# Patient Record
Sex: Female | Born: 1985 | Hispanic: Yes | Marital: Married | Smoking: Never smoker
Health system: Southern US, Community
[De-identification: ages and names within clinical notes are randomized; demographics above are authoritative.]

## PROBLEM LIST (undated history)

## (undated) ENCOUNTER — Inpatient Hospital Stay (HOSPITAL_COMMUNITY): Payer: Self-pay

## (undated) ENCOUNTER — Emergency Department (HOSPITAL_COMMUNITY): Payer: Self-pay | Source: Home / Self Care

## (undated) DIAGNOSIS — K831 Obstruction of bile duct: Secondary | ICD-10-CM

## (undated) DIAGNOSIS — F329 Major depressive disorder, single episode, unspecified: Secondary | ICD-10-CM

## (undated) DIAGNOSIS — O26649 Intrahepatic cholestasis of pregnancy, unspecified trimester: Secondary | ICD-10-CM

## (undated) DIAGNOSIS — Z9889 Other specified postprocedural states: Secondary | ICD-10-CM

## (undated) DIAGNOSIS — R1013 Epigastric pain: Secondary | ICD-10-CM

## (undated) DIAGNOSIS — R87629 Unspecified abnormal cytological findings in specimens from vagina: Secondary | ICD-10-CM

## (undated) DIAGNOSIS — G43909 Migraine, unspecified, not intractable, without status migrainosus: Secondary | ICD-10-CM

## (undated) DIAGNOSIS — K219 Gastro-esophageal reflux disease without esophagitis: Secondary | ICD-10-CM

## (undated) DIAGNOSIS — T8859XA Other complications of anesthesia, initial encounter: Secondary | ICD-10-CM

## (undated) DIAGNOSIS — J329 Chronic sinusitis, unspecified: Secondary | ICD-10-CM

## (undated) DIAGNOSIS — T4145XA Adverse effect of unspecified anesthetic, initial encounter: Secondary | ICD-10-CM

## (undated) DIAGNOSIS — R531 Weakness: Secondary | ICD-10-CM

## (undated) DIAGNOSIS — M549 Dorsalgia, unspecified: Secondary | ICD-10-CM

## (undated) DIAGNOSIS — O24419 Gestational diabetes mellitus in pregnancy, unspecified control: Secondary | ICD-10-CM

## (undated) DIAGNOSIS — F419 Anxiety disorder, unspecified: Secondary | ICD-10-CM

## (undated) DIAGNOSIS — A749 Chlamydial infection, unspecified: Secondary | ICD-10-CM

## (undated) DIAGNOSIS — B999 Unspecified infectious disease: Secondary | ICD-10-CM

## (undated) DIAGNOSIS — R112 Nausea with vomiting, unspecified: Secondary | ICD-10-CM

## (undated) DIAGNOSIS — M1990 Unspecified osteoarthritis, unspecified site: Secondary | ICD-10-CM

## (undated) DIAGNOSIS — F32A Depression, unspecified: Secondary | ICD-10-CM

## (undated) DIAGNOSIS — N200 Calculus of kidney: Secondary | ICD-10-CM

## (undated) HISTORY — DX: Unspecified osteoarthritis, unspecified site: M19.90

## (undated) HISTORY — PX: COLPOSCOPY: SHX161

## (undated) HISTORY — DX: Chronic sinusitis, unspecified: J32.9

## (undated) HISTORY — DX: Unspecified abnormal cytological findings in specimens from vagina: R87.629

## (undated) HISTORY — DX: Dorsalgia, unspecified: M54.9

## (undated) HISTORY — DX: Epigastric pain: R10.13

## (undated) HISTORY — DX: Gestational diabetes mellitus in pregnancy, unspecified control: O24.419

## (undated) HISTORY — DX: Migraine, unspecified, not intractable, without status migrainosus: G43.909

## (undated) HISTORY — DX: Major depressive disorder, single episode, unspecified: F32.9

## (undated) HISTORY — DX: Gastro-esophageal reflux disease without esophagitis: K21.9

## (undated) HISTORY — DX: Obstruction of bile duct: K83.1

## (undated) HISTORY — PX: DILATION AND CURETTAGE OF UTERUS: SHX78

## (undated) HISTORY — DX: Weakness: R53.1

## (undated) HISTORY — DX: Intrahepatic cholestasis of pregnancy, unspecified trimester: O26.649

## (undated) HISTORY — DX: Calculus of kidney: N20.0

## (undated) HISTORY — DX: Depression, unspecified: F32.A

---

## 2005-09-27 ENCOUNTER — Emergency Department (HOSPITAL_COMMUNITY): Admission: EM | Admit: 2005-09-27 | Discharge: 2005-09-28 | Payer: Self-pay | Admitting: Emergency Medicine

## 2005-12-12 DIAGNOSIS — A749 Chlamydial infection, unspecified: Secondary | ICD-10-CM

## 2005-12-12 HISTORY — DX: Chlamydial infection, unspecified: A74.9

## 2006-03-03 ENCOUNTER — Inpatient Hospital Stay (HOSPITAL_COMMUNITY): Admission: AD | Admit: 2006-03-03 | Discharge: 2006-03-04 | Payer: Self-pay | Admitting: Obstetrics & Gynecology

## 2006-03-09 ENCOUNTER — Inpatient Hospital Stay (HOSPITAL_COMMUNITY): Admission: AD | Admit: 2006-03-09 | Discharge: 2006-03-09 | Payer: Self-pay | Admitting: *Deleted

## 2006-03-16 ENCOUNTER — Inpatient Hospital Stay (HOSPITAL_COMMUNITY): Admission: AD | Admit: 2006-03-16 | Discharge: 2006-03-16 | Payer: Self-pay | Admitting: Gynecology

## 2006-03-20 ENCOUNTER — Inpatient Hospital Stay (HOSPITAL_COMMUNITY): Admission: AD | Admit: 2006-03-20 | Discharge: 2006-03-20 | Payer: Self-pay | Admitting: *Deleted

## 2006-03-22 ENCOUNTER — Encounter (INDEPENDENT_AMBULATORY_CARE_PROVIDER_SITE_OTHER): Payer: Self-pay | Admitting: Specialist

## 2006-03-22 ENCOUNTER — Ambulatory Visit: Payer: Self-pay | Admitting: Gynecology

## 2006-03-22 ENCOUNTER — Ambulatory Visit (HOSPITAL_COMMUNITY): Admission: RE | Admit: 2006-03-22 | Discharge: 2006-03-22 | Payer: Self-pay | Admitting: Gynecology

## 2006-08-23 ENCOUNTER — Inpatient Hospital Stay (HOSPITAL_COMMUNITY): Admission: AD | Admit: 2006-08-23 | Discharge: 2006-08-24 | Payer: Self-pay | Admitting: Gynecology

## 2006-09-05 ENCOUNTER — Inpatient Hospital Stay (HOSPITAL_COMMUNITY): Admission: RE | Admit: 2006-09-05 | Discharge: 2006-09-05 | Payer: Self-pay | Admitting: Gynecology

## 2006-09-25 ENCOUNTER — Ambulatory Visit (HOSPITAL_COMMUNITY): Admission: RE | Admit: 2006-09-25 | Discharge: 2006-09-25 | Payer: Self-pay | Admitting: Family Medicine

## 2006-10-16 ENCOUNTER — Inpatient Hospital Stay (HOSPITAL_COMMUNITY): Admission: AD | Admit: 2006-10-16 | Discharge: 2006-10-16 | Payer: Self-pay | Admitting: Gynecology

## 2006-11-23 ENCOUNTER — Ambulatory Visit (HOSPITAL_COMMUNITY): Admission: RE | Admit: 2006-11-23 | Discharge: 2006-11-23 | Payer: Self-pay | Admitting: Obstetrics & Gynecology

## 2006-12-10 ENCOUNTER — Ambulatory Visit: Payer: Self-pay | Admitting: Obstetrics and Gynecology

## 2006-12-10 ENCOUNTER — Inpatient Hospital Stay (HOSPITAL_COMMUNITY): Admission: AD | Admit: 2006-12-10 | Discharge: 2006-12-10 | Payer: Self-pay | Admitting: Obstetrics and Gynecology

## 2006-12-11 ENCOUNTER — Inpatient Hospital Stay (HOSPITAL_COMMUNITY): Admission: AD | Admit: 2006-12-11 | Discharge: 2006-12-15 | Payer: Self-pay | Admitting: Gynecology

## 2006-12-11 ENCOUNTER — Ambulatory Visit: Payer: Self-pay | Admitting: Obstetrics and Gynecology

## 2006-12-13 ENCOUNTER — Encounter: Payer: Self-pay | Admitting: Gynecology

## 2007-04-19 ENCOUNTER — Ambulatory Visit (HOSPITAL_COMMUNITY): Admission: RE | Admit: 2007-04-19 | Discharge: 2007-04-19 | Payer: Self-pay | Admitting: Obstetrics & Gynecology

## 2007-04-26 ENCOUNTER — Inpatient Hospital Stay (HOSPITAL_COMMUNITY): Admission: AD | Admit: 2007-04-26 | Discharge: 2007-04-26 | Payer: Self-pay | Admitting: Obstetrics & Gynecology

## 2007-05-03 ENCOUNTER — Inpatient Hospital Stay (HOSPITAL_COMMUNITY): Admission: AD | Admit: 2007-05-03 | Discharge: 2007-05-06 | Payer: Self-pay | Admitting: Obstetrics & Gynecology

## 2008-12-21 ENCOUNTER — Inpatient Hospital Stay (HOSPITAL_COMMUNITY): Admission: AD | Admit: 2008-12-21 | Discharge: 2008-12-21 | Payer: Self-pay | Admitting: Obstetrics and Gynecology

## 2009-03-11 ENCOUNTER — Emergency Department (HOSPITAL_COMMUNITY): Admission: EM | Admit: 2009-03-11 | Discharge: 2009-03-11 | Payer: Self-pay | Admitting: Family Medicine

## 2009-05-20 ENCOUNTER — Inpatient Hospital Stay (HOSPITAL_COMMUNITY): Admission: AD | Admit: 2009-05-20 | Discharge: 2009-05-20 | Payer: Self-pay | Admitting: Obstetrics & Gynecology

## 2009-05-20 ENCOUNTER — Ambulatory Visit: Payer: Self-pay | Admitting: Advanced Practice Midwife

## 2009-09-07 ENCOUNTER — Emergency Department (HOSPITAL_COMMUNITY): Admission: EM | Admit: 2009-09-07 | Discharge: 2009-09-07 | Payer: Self-pay | Admitting: Family Medicine

## 2009-09-09 ENCOUNTER — Emergency Department (HOSPITAL_COMMUNITY): Admission: EM | Admit: 2009-09-09 | Discharge: 2009-09-09 | Payer: Self-pay | Admitting: Emergency Medicine

## 2009-10-18 ENCOUNTER — Emergency Department (HOSPITAL_COMMUNITY): Admission: EM | Admit: 2009-10-18 | Discharge: 2009-10-18 | Payer: Self-pay | Admitting: Family Medicine

## 2009-12-06 ENCOUNTER — Emergency Department (HOSPITAL_COMMUNITY): Admission: EM | Admit: 2009-12-06 | Discharge: 2009-12-06 | Payer: Self-pay | Admitting: Emergency Medicine

## 2009-12-08 ENCOUNTER — Inpatient Hospital Stay (HOSPITAL_COMMUNITY): Admission: AD | Admit: 2009-12-08 | Discharge: 2009-12-08 | Payer: Self-pay | Admitting: Obstetrics and Gynecology

## 2010-11-26 ENCOUNTER — Emergency Department (HOSPITAL_COMMUNITY)
Admission: EM | Admit: 2010-11-26 | Discharge: 2010-11-26 | Payer: Self-pay | Source: Home / Self Care | Admitting: Emergency Medicine

## 2010-11-30 ENCOUNTER — Emergency Department (HOSPITAL_COMMUNITY)
Admission: EM | Admit: 2010-11-30 | Discharge: 2010-11-30 | Payer: Self-pay | Source: Home / Self Care | Admitting: Emergency Medicine

## 2010-12-12 HISTORY — PX: COLONOSCOPY: SHX174

## 2010-12-12 HISTORY — PX: UPPER GASTROINTESTINAL ENDOSCOPY: SHX188

## 2011-01-02 ENCOUNTER — Encounter: Payer: Self-pay | Admitting: Obstetrics & Gynecology

## 2011-01-06 ENCOUNTER — Other Ambulatory Visit: Payer: Self-pay | Admitting: Gastroenterology

## 2011-01-07 ENCOUNTER — Encounter
Admission: RE | Admit: 2011-01-07 | Discharge: 2011-01-07 | Payer: Self-pay | Source: Home / Self Care | Attending: Gastroenterology | Admitting: Gastroenterology

## 2011-01-27 ENCOUNTER — Other Ambulatory Visit: Payer: Self-pay | Admitting: Gastroenterology

## 2011-02-21 LAB — COMPREHENSIVE METABOLIC PANEL
ALT: 26 U/L (ref 0–35)
AST: 28 U/L (ref 0–37)
Albumin: 3.8 g/dL (ref 3.5–5.2)
Calcium: 9.1 mg/dL (ref 8.4–10.5)
GFR calc Af Amer: >60 mL/min (ref >60)
Sodium: 137 mEq/L (ref 135–145)
Total Protein: 6.8 g/dL (ref 6.0–8.3)

## 2011-02-21 LAB — URINALYSIS, ROUTINE W REFLEX MICROSCOPIC
Bilirubin Urine: NEGATIVE
Glucose, UA: NEGATIVE mg/dL
Ketones, ur: NEGATIVE mg/dL
Nitrite: NEGATIVE
Protein, ur: NEGATIVE mg/dL
Urobilinogen, UA: 0.2 mg/dL (ref 0.0–1.0)
Urobilinogen, UA: 0.2 mg/dL (ref 0.0–1.0)

## 2011-02-21 LAB — POCT PREGNANCY, URINE: Preg Test, Ur: NEGATIVE

## 2011-02-21 LAB — URINE MICROSCOPIC-ADD ON

## 2011-02-21 LAB — DIFFERENTIAL
Eosinophils Absolute: 0.1 10*3/uL (ref 0.0–0.7)
Eosinophils Relative: 1 % (ref 0–5)
Lymphs Abs: 2.9 10*3/uL (ref 0.7–4.0)
Monocytes Relative: 8 % (ref 3–12)

## 2011-02-21 LAB — CBC
MCHC: 32.9 g/dL (ref 30.0–36.0)
RDW: 13.5 % (ref 11.5–15.5)

## 2011-03-14 LAB — URINALYSIS, ROUTINE W REFLEX MICROSCOPIC
Glucose, UA: NEGATIVE mg/dL
Ketones, ur: NEGATIVE mg/dL
Protein, ur: NEGATIVE mg/dL

## 2011-03-14 LAB — POCT PREGNANCY, URINE: Preg Test, Ur: NEGATIVE

## 2011-03-14 LAB — GC/CHLAMYDIA PROBE AMP, GENITAL: Chlamydia, DNA Probe: NEGATIVE

## 2011-03-14 LAB — WET PREP, GENITAL
Clue Cells Wet Prep HPF POC: NONE SEEN
WBC, Wet Prep HPF POC: NONE SEEN

## 2011-03-18 LAB — DIFFERENTIAL
Basophils Absolute: 0.1 10*3/uL (ref 0.0–0.1)
Basophils Relative: 1 % (ref 0–1)
Basophils Relative: 1 % (ref 0–1)
Eosinophils Absolute: 0.1 10*3/uL (ref 0.0–0.7)
Lymphocytes Relative: 33 % (ref 12–46)
Lymphs Abs: 2.3 10*3/uL (ref 0.7–4.0)
Lymphs Abs: 2.9 10*3/uL (ref 0.7–4.0)
Monocytes Absolute: 0.6 10*3/uL (ref 0.1–1.0)
Monocytes Relative: 7 % (ref 3–12)
Monocytes Relative: 8 % (ref 3–12)
Neutro Abs: 4.2 10*3/uL (ref 1.7–7.7)

## 2011-03-18 LAB — URINALYSIS, ROUTINE W REFLEX MICROSCOPIC
Glucose, UA: NEGATIVE mg/dL
Hgb urine dipstick: NEGATIVE
Ketones, ur: NEGATIVE mg/dL
Protein, ur: NEGATIVE mg/dL
pH: 7 (ref 5.0–8.0)

## 2011-03-18 LAB — CBC
HCT: 35.8 % — ABNORMAL LOW (ref 36.0–46.0)
Hemoglobin: 12.2 g/dL (ref 12.0–15.0)
Platelets: 267 10*3/uL (ref 150–400)
RBC: 4.41 MIL/uL (ref 3.87–5.11)
RDW: 14 % (ref 11.5–15.5)
RDW: 14.4 % (ref 11.5–15.5)

## 2011-03-18 LAB — POCT URINALYSIS DIP (DEVICE)
Hgb urine dipstick: NEGATIVE
Ketones, ur: NEGATIVE mg/dL
Protein, ur: NEGATIVE mg/dL
Specific Gravity, Urine: 1.015 (ref 1.005–1.030)
pH: >=9 (ref 5.0–8.0)

## 2011-03-18 LAB — BASIC METABOLIC PANEL
Calcium: 9.1 mg/dL (ref 8.4–10.5)
GFR calc Af Amer: >60 mL/min (ref >60)
GFR calc non Af Amer: >60 mL/min (ref >60)
Glucose, Bld: 91 mg/dL (ref 70–99)
Potassium: 3.9 mEq/L (ref 3.5–5.1)
Sodium: 138 mEq/L (ref 135–145)

## 2011-03-18 LAB — WET PREP, GENITAL
Trich, Wet Prep: NONE SEEN
Yeast Wet Prep HPF POC: NONE SEEN

## 2011-03-18 LAB — COMPREHENSIVE METABOLIC PANEL
ALT: 28 U/L (ref 0–35)
Albumin: 4.3 g/dL (ref 3.5–5.2)
Alkaline Phosphatase: 92 U/L (ref 39–117)
Calcium: 9.5 mg/dL (ref 8.4–10.5)
GFR calc Af Amer: >60 mL/min (ref >60)
Potassium: 4.2 mEq/L (ref 3.5–5.1)
Sodium: 136 mEq/L (ref 135–145)
Total Protein: 7.5 g/dL (ref 6.0–8.3)

## 2011-03-18 LAB — GC/CHLAMYDIA PROBE AMP, GENITAL: Chlamydia, DNA Probe: NEGATIVE

## 2011-03-18 LAB — POCT PREGNANCY, URINE: Preg Test, Ur: NEGATIVE

## 2011-03-18 LAB — LIPASE, BLOOD: Lipase: 29 U/L (ref 11–59)

## 2011-03-21 LAB — WET PREP, GENITAL: Yeast Wet Prep HPF POC: NONE SEEN

## 2011-03-21 LAB — URINALYSIS, ROUTINE W REFLEX MICROSCOPIC
Nitrite: NEGATIVE
Protein, ur: NEGATIVE mg/dL
Specific Gravity, Urine: 1.025 (ref 1.005–1.030)
Urobilinogen, UA: 0.2 mg/dL (ref 0.0–1.0)

## 2011-03-21 LAB — POCT PREGNANCY, URINE: Preg Test, Ur: NEGATIVE

## 2011-03-24 LAB — POCT RAPID STREP A (OFFICE): Streptococcus, Group A Screen (Direct): NEGATIVE

## 2011-03-28 LAB — POCT PREGNANCY, URINE: Preg Test, Ur: NEGATIVE

## 2011-03-28 LAB — URINALYSIS, ROUTINE W REFLEX MICROSCOPIC
Glucose, UA: NEGATIVE mg/dL
Ketones, ur: NEGATIVE mg/dL
Nitrite: NEGATIVE
pH: 6 (ref 5.0–8.0)

## 2011-04-26 NOTE — H&P (Signed)
NAMEANALEISE, MCCLEERY     ACCOUNT NO.:  [id number]   MEDICAL RECORD NO.:  [id number]          PATIENT TYPE:  INP   LOCATION:  9173                          FACILITY:  [facility]   PHYSICIAN:  Roseanna Rainbow, M.D.DATE OF BIRTH:  Apr 16, 1986   DATE OF ADMISSION:  05/03/2007  DATE OF DISCHARGE:                              HISTORY & PHYSICAL   CHIEF COMPLAINT:  The patient is a 25-year-old para 1 with an estimated  date of confinement of Apr 23, 2007, with an intrauterine pregnancy at  41 plus weeks for induction of labor secondary to post dates.   ALLERGIES:  NO KNOWN DRUG ALLERGIES.   MEDICATIONS:  Please see the reconciliation form.   OBSTETRICAL RISK FACTORS:  Urinary tract infection.   PRENATAL SCREENING:  Hematocrit 32.7, hemoglobin 10.1, platelets  263,000.  Urine culture and sensitivity:  No growth.  One-hour GCT 112.  Blood type A positive.  Antibody screen negative.  Hepatitis B surface  antigen negative.  Rubella immune.  Sickle cell negative.  RPR  nonreactive.  Varicella immune.   PAST GYNECOLOGIC HISTORY:  1. D&C for an incomplete abortion.  2. Chlamydia.   PAST MEDICAL HISTORY:  1. Anemia.  2. Urinary tract infection.   PAST SURGICAL HISTORY:  Please see the above.   SOCIAL HISTORY:  She is employed as a Tourist information centre manager.  She is  married living with her spouse, does not give any history of significant  alcohol usage, has no significant smoking history.   FAMILY HISTORY:  Hypertension.   PAST OBSTETRICAL HISTORY:  1. In 2007, she had a spontaneous abortion.  2. In August 2003, she was delivered of a liveborn female, 7 pounds 6      ounces, full term, vaginal delivery, no complications.   PHYSICAL EXAMINATION:  VITAL SIGNS:  Stable, afebrile.  Fetal heart  tracing reassuring.  Tocodynamometer rare uterine contractions.  GENERAL:  Well developed, well nourished, no apparent distress.  ABDOMEN:  Gravid.  PELVIC:  Sterile vaginal exam per the RN.   ASSESSMENT:  1. Intrauterine pregnancy at 41 plus weeks.  2. Borderline Bishop score.  3. Fetal heart tracing consistent with fetal well being.   PLAN:  Admission two stage induction of labor.  Will begin with cervical  ripening with Cervidil.      Roseanna Rainbow, M.D.  Electronically Signed     LAJ/MEDQ  D:  05/03/2007  T:  05/03/2007  Job:  [id number]

## 2011-04-29 NOTE — Op Note (Signed)
NAMESHATHA, HOOSER     ACCOUNT NO.:  [id number]   MEDICAL RECORD NO.:  [id number]          PATIENT TYPE:  AMB   LOCATION:  SDC                           FACILITY:  [facility]   PHYSICIAN:  Ginger Carne, MD  DATE OF BIRTH:  05-01-1986   DATE OF PROCEDURE:  03/22/2006  DATE OF DISCHARGE:                                 OPERATIVE REPORT   PREOPERATIVE DIAGNOSIS:  First trimester incomplete abortion.   POSTOPERATIVE DIAGNOSIS:  First trimester incomplete abortion.   PROCEDURE:  Aspiration dilatation and curettage.   SURGEON:  Ginger Carne, M.D.   ASSISTANT:  None.   COMPLICATIONS:  None immediate.   ESTIMATED BLOOD LOSS:  Minimal.   SPECIMEN:  Products of conception.   OPERATIVE FINDINGS:  Cervix was open with proximal conception visible.  The  uterus was about four to six weeks in size.  Both adnexa palpable and found  to be normal.   OPERATIVE PROCEDURE:  The patient prepped and draped in the usual fashion  and placed in the lithotomy position, Betadine solution used for antiseptic  and the patient was catheterized prior to procedure.  Afterwards a tenaculum  was placed on the anterior lip of the cervix.  A paracervical block with  Marcaine and epinephrine utilized.  Dilatation was not necessary, and a #8  suction cannula was utilized for curettage.  No products of conception noted  at the end of the procedure, no bleeding noted.  Specimen to pathology.  The  patient tolerated the procedure well and returned to the postanesthesia  recovery room in excellent condition.      Ginger Carne, MD  Electronically Signed     SHB/MEDQ  D:  03/22/2006  T:  03/22/2006  Job:  [id number]

## 2011-04-29 NOTE — Discharge Summary (Signed)
NAMEQUANESHA, KLIMASZEWSKI     ACCOUNT NO.:  [id number]   MEDICAL RECORD NO.:  [id number]          PATIENT TYPE:  INP   LOCATION:  9317                          FACILITY:  [facility]   PHYSICIAN:  Ginger Carne, MD  DATE OF BIRTH:  1986/01/23   DATE OF ADMISSION:  12/11/2006  DATE OF DISCHARGE:  12/15/2006                               DISCHARGE SUMMARY   ADMISSION DIAGNOSES:  1. Pyelonephritis.  2. Intrauterine pregnancy at 20 weeks 3 days gestation.   DISCHARGE DIAGNOSES:  1. Pyelonephritis.  2. Intrauterine pregnancy at 20 weeks 3 days gestation.   PERTINENT LABORATORIES:  White blood cell count of 11.4, hemoglobin 9.9,  platelets 256.  Neutrophil count of 79, creatinine of 0.4, urine culture  that was positive for greater than 100,000 E. coli that was  pansensitive.   STUDIES:  1. Renal ultrasound which showed a mild fullness of bilateral      intrarenal collecting system maybe physiologic in pregnancy,      bilateral ureteral jets were seen.  2. MRI of the abdomen and pelvis that shows no nephroureterolithiasis.      There was trace left-sided perinephric fluid that is nonspecific      but could be consistent with given diagnosis of pyelo.  No      hydronephrosis seen.   REASON FOR ADMISSION:  This is a 25 year old gravida 3 para 1-0-1-1 at  20 weeks 3 days gestation who presented with worsening left-sided  abdominal pain, positive nausea/vomiting, no dysuria or hematuria.  She  was seen 1 day prior to admission with similar complaints and had a  urine culture done that resulted in greater than 100,000 E. coli.  On  presentation she was found to have a left-sided CVA tenderness and was  admitted for pyelonephritis.   HOSPITAL COURSE:  The patient admitted with pyelo.  She was started on  Ancef 1 gram q.6.  The patient spiked a temperature of 101.9 on hospital  day #2.  Gentamicin was added to her regimen and the patient remained  afebrile throughout the rest of the  hospital course.  She  symptomatically improved and was hemodynamically stable and felt stable  enough for discharge on hospital day #4.  Her discharge medications  included Keflex 500 mg p.o. b.i.d. x10 days, Macrobid 100 mg daily for  suppression, prenatal vitamins.  Her discharge instructions included  followup at the  health department on December 19, 2006 as scheduled.  She was sent home  with a strainer to strain her urine because her symptoms are also  consistent with having a kidney stone.  The patient also given a note to  return to work on December 19, 2006.     ______________________________  Paticia Stack, MD      Ginger Carne, MD  Electronically Signed    LNJ/MEDQ  D:  12/20/2006  T:  12/20/2006  Job:  [id number]

## 2012-02-20 ENCOUNTER — Encounter (HOSPITAL_COMMUNITY): Payer: Self-pay | Admitting: *Deleted

## 2012-02-20 ENCOUNTER — Inpatient Hospital Stay (HOSPITAL_COMMUNITY): Payer: Medicaid Other

## 2012-02-20 ENCOUNTER — Inpatient Hospital Stay (HOSPITAL_COMMUNITY)
Admission: AD | Admit: 2012-02-20 | Discharge: 2012-02-20 | Disposition: A | Discharge status: Home / Self Care | Payer: Medicaid Other | Source: Ambulatory Visit | Attending: Obstetrics & Gynecology | Admitting: Obstetrics & Gynecology

## 2012-02-20 DIAGNOSIS — R109 Unspecified abdominal pain: Secondary | ICD-10-CM | POA: Insufficient documentation

## 2012-02-20 DIAGNOSIS — N898 Other specified noninflammatory disorders of vagina: Secondary | ICD-10-CM | POA: Insufficient documentation

## 2012-02-20 DIAGNOSIS — N949 Unspecified condition associated with female genital organs and menstrual cycle: Secondary | ICD-10-CM | POA: Insufficient documentation

## 2012-02-20 DIAGNOSIS — R102 Pelvic and perineal pain: Secondary | ICD-10-CM

## 2012-02-20 HISTORY — DX: Chlamydial infection, unspecified: A74.9

## 2012-02-20 HISTORY — DX: Unspecified infectious disease: B99.9

## 2012-02-20 LAB — CBC
HCT: 37.2 % (ref 36.0–46.0)
Hemoglobin: 12.2 g/dL (ref 12.0–15.0)
RBC: 4.69 MIL/uL (ref 3.87–5.11)

## 2012-02-20 LAB — URINE MICROSCOPIC-ADD ON

## 2012-02-20 LAB — URINALYSIS, ROUTINE W REFLEX MICROSCOPIC
Bilirubin Urine: NEGATIVE
Nitrite: NEGATIVE
Protein, ur: NEGATIVE mg/dL
Urobilinogen, UA: 0.2 mg/dL (ref 0.0–1.0)

## 2012-02-20 LAB — WET PREP, GENITAL: Clue Cells Wet Prep HPF POC: NONE SEEN

## 2012-02-20 LAB — DIFFERENTIAL
Lymphs Abs: 2.8 10*3/uL (ref 0.7–4.0)
Monocytes Relative: 9 % (ref 3–12)
Neutro Abs: 3.3 10*3/uL (ref 1.7–7.7)
Neutrophils Relative %: 49 % (ref 43–77)

## 2012-02-20 NOTE — MAU Note (Signed)
Patient states she has left lower abdominal pain that is worse with intercourse for about 3 months. Was recently checked for STI's and told everything was negative but continues to have a yellow discharge with an odor at times. No bleeding.

## 2012-02-20 NOTE — MAU Provider Note (Signed)
History     CSN: [id number]  Arrival date & time 02/20/12  1652   None     Chief Complaint  Patient presents with  . Abdominal Pain  . Vaginal Discharge   HPI Julia [name] a is a 26 y.o. female who presents to MAU for vaginal discharge that started about 3 months ago. Also complains of abdominal pain that comes and goes for the past 3 months . LMP 02/06/12, last pap smear last year and was normal. Current sex partner x 6 years, condoms for birth control. Hx of chlamydia 2007.   Past Medical History  Diagnosis Date  . Chlamydia 2007  . Infection     Past Surgical History  Procedure Date  . Dilation and curettage of uterus   . Colonoscopy 2012  . Upper gastrointestinal endoscopy 2012    Family History  Problem Relation Age of Onset  . Anesthesia problems Neg Hx     History  Substance Use Topics  . Smoking status: Never Smoker   . Smokeless tobacco: Never Used  . Alcohol Use: No    OB History    Grav Para Term Preterm Abortions TAB SAB Ect Mult Living   3 2   1  1   2       Review of Systems  Constitutional: Positive for appetite change. Negative for fever, chills, diaphoresis and fatigue.  HENT: Negative for ear pain, congestion, sore throat, facial swelling, neck pain, neck stiffness, dental problem and sinus pressure.   Eyes: Negative for photophobia, pain and discharge.  Respiratory: Negative for cough, chest tightness and wheezing.   Cardiovascular: Negative.   Gastrointestinal: Positive for nausea, abdominal pain and constipation. Negative for vomiting, diarrhea and abdominal distention.  Genitourinary: Positive for vaginal discharge, pelvic pain and dyspareunia. Negative for dysuria, frequency, flank pain, vaginal bleeding (sometimes bleeding after sexual intercourse) and difficulty urinating.  Musculoskeletal: Positive for back pain. Negative for myalgias and gait problem.  Skin: Negative for color change and rash.  Neurological: Positive for  headaches. Negative for dizziness, speech difficulty, weakness, light-headedness and numbness.  Psychiatric/Behavioral: Negative for confusion and agitation. The patient is not nervous/anxious.     Allergies  Review of patient's allergies indicates no known allergies.  Home Medications  No current outpatient prescriptions on file.  BP 115/74  Pulse 75  Temp(Src) 98.8 F (37.1 C) (Oral)  Resp 16  Ht 5' (1.524 m)  Wt 134 lb 6.4 oz (60.963 kg)  BMI 26.25 kg/m2  SpO2 100%  LMP 02/06/2012  Physical Exam  Nursing note and vitals reviewed. Constitutional: She is oriented to person, place, and time. She appears well-developed and well-nourished.  Eyes: EOM are normal.  Neck: Neck supple.  Pulmonary/Chest: Effort normal.  Abdominal: Soft. There is tenderness. There is no rebound and no guarding.       Slightly tender lower abdomen left > right.   Genitourinary:       External genitalia with out lesions. White discharge vaginal vault. Cervix without lesions, + CMT, left adnexal tenderness. Uterus without palpable enlargement.  Musculoskeletal: Normal range of motion.  Neurological: She is alert and oriented to person, place, and time. No cranial nerve deficit.  Skin: Skin is warm and dry.  Psychiatric: She has a normal mood and affect. Her behavior is normal. Judgment and thought content normal.   Results for orders placed during the hospital encounter of 02/20/12 (from the past 24 hour(s))  URINALYSIS, ROUTINE W REFLEX MICROSCOPIC     Status: Abnormal  Collection Time   02/20/12  5:25 PM      Component Value Range   Color, Urine YELLOW  YELLOW    APPearance CLEAR  CLEAR    Specific Gravity, Urine >1.030 (*) 1.005 - 1.030    pH 6.0  5.0 - 8.0    Glucose, UA NEGATIVE  NEGATIVE (mg/dL)   Hgb urine dipstick TRACE (*) NEGATIVE    Bilirubin Urine NEGATIVE  NEGATIVE    Ketones, ur NEGATIVE  NEGATIVE (mg/dL)   Protein, ur NEGATIVE  NEGATIVE (mg/dL)   Urobilinogen, UA 0.2  0.0 - 1.0  (mg/dL)   Nitrite NEGATIVE  NEGATIVE    Leukocytes, UA NEGATIVE  NEGATIVE   URINE MICROSCOPIC-ADD ON     Status: Abnormal   Collection Time   02/20/12  5:25 PM      Component Value Range   Squamous Epithelial / LPF FEW (*) RARE    WBC, UA 0-2  <3 (WBC/hpf)   RBC / HPF 0-2  <3 (RBC/hpf)   Bacteria, UA FEW (*) RARE    Urine-Other MUCOUS PRESENT    WET PREP, GENITAL     Status: Abnormal   Collection Time   02/20/12  6:55 PM      Component Value Range   Yeast Wet Prep HPF POC NONE SEEN  NONE SEEN    Trich, Wet Prep NONE SEEN  NONE SEEN    Clue Cells Wet Prep HPF POC NONE SEEN  NONE SEEN    WBC, Wet Prep HPF POC FEW (*) NONE SEEN   POCT PREGNANCY, URINE     Status: Normal   Collection Time   02/20/12  6:55 PM      Component Value Range   Preg Test, Ur NEGATIVE  NEGATIVE   CBC     Status: Normal   Collection Time   02/20/12  7:00 PM      Component Value Range   WBC 6.8  4.0 - 10.5 (K/uL)   RBC 4.69  3.87 - 5.11 (MIL/uL)   Hemoglobin 12.2  12.0 - 15.0 (g/dL)   HCT 84.6  96.2 - 95.2 (%)   MCV 79.3  78.0 - 100.0 (fL)   MCH 26.0  26.0 - 34.0 (pg)   MCHC 32.8  30.0 - 36.0 (g/dL)   RDW 84.1  32.4 - 40.1 (%)   Platelets 266  150 - 400 (K/uL)  DIFFERENTIAL     Status: Normal   Collection Time   02/20/12  7:00 PM      Component Value Range   Neutrophils Relative 49  43 - 77 (%)   Neutro Abs 3.3  1.7 - 7.7 (K/uL)   Lymphocytes Relative 41  12 - 46 (%)   Lymphs Abs 2.8  0.7 - 4.0 (K/uL)   Monocytes Relative 9  3 - 12 (%)   Monocytes Absolute 0.6  0.1 - 1.0 (K/uL)   Eosinophils Relative 0  0 - 5 (%)   Eosinophils Absolute 0.0  0.0 - 0.7 (K/uL)   Basophils Relative 0  0 - 1 (%)   Basophils Absolute 0.0  0.0 - 0.1 (K/uL)   US Transvaginal Non-ob  02/20/2012  *RADIOLOGY REPORT*  Clinical Data: Pelvic pain, IUD removed 2 years ago  TRANSABDOMINAL AND TRANSVAGINAL ULTRASOUND OF PELVIS Technique:  Both transabdominal and transvaginal ultrasound examinations of the pelvis were performed.  Transabdominal technique was performed for global imaging of the pelvis including uterus, ovaries, adnexal regions, and pelvic cul-de-sac.  Comparison: None.  It was necessary to proceed with endovaginal exam following the transabdominal exam to visualize the the uterus, endometrium and ovaries.  Findings:  Uterus: Normal in size and appearance measures 7.8 x 3.1 x 4.3 cm. No fibroids are noted.   Endometrium: Normal in thickness and appearance. Measures 8.3 mm thickness within normal limits.  Right ovary:  Normal appearance/no adnexal mass.  Measures 3.1 x 1.9 x 2.3 cm.  Normal ovarian follicles.  Left ovary: Normal appearance/no adnexal mass.  Measures 2 x 1.8 x 1.7 cm.  Other findings: No free fluid  IMPRESSION: Normal study. No evidence of pelvic mass or other significant abnormality.  Original Report Authenticated By: Natasha Mead, M.D.   US Pelvis Complete  02/20/2012  *RADIOLOGY REPORT*  Clinical Data: Pelvic pain, IUD removed 2 years ago  TRANSABDOMINAL AND TRANSVAGINAL ULTRASOUND OF PELVIS Technique:  Both transabdominal and transvaginal ultrasound examinations of the pelvis were performed. Transabdominal technique was performed for global imaging of the pelvis including uterus, ovaries, adnexal regions, and pelvic cul-de-sac.  Comparison: None.   It was necessary to proceed with endovaginal exam following the transabdominal exam to visualize the the uterus, endometrium and ovaries.  Findings:  Uterus: Normal in size and appearance measures 7.8 x 3.1 x 4.3 cm. No fibroids are noted.   Endometrium: Normal in thickness and appearance. Measures 8.3 mm thickness within normal limits.  Right ovary:  Normal appearance/no adnexal mass.  Measures 3.1 x 1.9 x 2.3 cm.  Normal ovarian follicles.  Left ovary: Normal appearance/no adnexal mass.  Measures 2 x 1.8 x 1.7 cm.  Other findings: No free fluid  IMPRESSION: Normal study. No evidence of pelvic mass or other significant abnormality.  Original Report Authenticated  By: Natasha Mead, M.D.    Assessment: Pelvic pain with vaginal discharge x 3 months  Plan:  Follow up in the office   Ibuprofen as needed for pain  ED Course  Procedures    MDM

## 2012-02-20 NOTE — Discharge Instructions (Signed)
YOUR BLOOD WORK, WET PREP AND ULTRASOUND WERE ALL NORMAL. YOU WILL NEED TO FOLLOW UP WITH DR. Tamela Oddi FOR FURTHER EVALUATION IF THE PAIN CONTINUES. TAKE IBUPROFEN AS NEEDED FOR PAIN.

## 2012-02-21 LAB — GC/CHLAMYDIA PROBE AMP, GENITAL: GC Probe Amp, Genital: NEGATIVE

## 2012-05-29 ENCOUNTER — Institutional Professional Consult (permissible substitution): Payer: Medicaid Other | Admitting: Nurse Practitioner

## 2012-05-29 DIAGNOSIS — R51 Headache: Secondary | ICD-10-CM

## 2013-01-21 ENCOUNTER — Inpatient Hospital Stay (HOSPITAL_COMMUNITY)
Admission: EM | Admit: 2013-01-21 | Discharge: 2013-01-28 | DRG: 690 | Disposition: A | Discharge status: Home / Self Care | Payer: Medicaid Other | Attending: Internal Medicine | Admitting: Internal Medicine

## 2013-01-21 ENCOUNTER — Emergency Department (HOSPITAL_COMMUNITY): Payer: Medicaid Other

## 2013-01-21 ENCOUNTER — Encounter (HOSPITAL_COMMUNITY): Payer: Self-pay | Admitting: Emergency Medicine

## 2013-01-21 DIAGNOSIS — K5900 Constipation, unspecified: Secondary | ICD-10-CM | POA: Diagnosis present

## 2013-01-21 DIAGNOSIS — N12 Tubulo-interstitial nephritis, not specified as acute or chronic: Principal | ICD-10-CM | POA: Diagnosis present

## 2013-01-21 DIAGNOSIS — R109 Unspecified abdominal pain: Secondary | ICD-10-CM | POA: Diagnosis present

## 2013-01-21 DIAGNOSIS — Z23 Encounter for immunization: Secondary | ICD-10-CM

## 2013-01-21 DIAGNOSIS — K838 Other specified diseases of biliary tract: Secondary | ICD-10-CM | POA: Diagnosis present

## 2013-01-21 DIAGNOSIS — Z79899 Other long term (current) drug therapy: Secondary | ICD-10-CM

## 2013-01-21 DIAGNOSIS — R112 Nausea with vomiting, unspecified: Secondary | ICD-10-CM | POA: Diagnosis present

## 2013-01-21 DIAGNOSIS — R1013 Epigastric pain: Secondary | ICD-10-CM | POA: Diagnosis present

## 2013-01-21 DIAGNOSIS — F411 Generalized anxiety disorder: Secondary | ICD-10-CM | POA: Diagnosis present

## 2013-01-21 DIAGNOSIS — D649 Anemia, unspecified: Secondary | ICD-10-CM | POA: Diagnosis present

## 2013-01-21 HISTORY — DX: Anxiety disorder, unspecified: F41.9

## 2013-01-21 LAB — URINALYSIS, MICROSCOPIC ONLY
Bilirubin Urine: NEGATIVE
Ketones, ur: NEGATIVE mg/dL
Protein, ur: NEGATIVE mg/dL
Urobilinogen, UA: 1 mg/dL (ref 0.0–1.0)

## 2013-01-21 LAB — CBC WITH DIFFERENTIAL/PLATELET
Basophils Absolute: 0 10*3/uL (ref 0.0–0.1)
Basophils Relative: 0 % (ref 0–1)
Eosinophils Absolute: 0.1 10*3/uL (ref 0.0–0.7)
Eosinophils Relative: 1 % (ref 0–5)
HCT: 39.1 % (ref 36.0–46.0)
Hemoglobin: 12.8 g/dL (ref 12.0–15.0)
Lymphocytes Relative: 39 % (ref 12–46)
Lymphs Abs: 2.9 10*3/uL (ref 0.7–4.0)
MCH: 26.4 pg (ref 26.0–34.0)
MCHC: 32.7 g/dL (ref 30.0–36.0)
MCV: 80.8 fL (ref 78.0–100.0)
Monocytes Absolute: 0.6 10*3/uL (ref 0.1–1.0)
Monocytes Relative: 8 % (ref 3–12)
Neutro Abs: 3.8 10*3/uL (ref 1.7–7.7)
Neutrophils Relative %: 52 % (ref 43–77)
Platelets: 301 10*3/uL (ref 150–400)
RBC: 4.84 MIL/uL (ref 3.87–5.11)
RDW: 13.8 % (ref 11.5–15.5)
WBC: 7.4 10*3/uL (ref 4.0–10.5)

## 2013-01-21 LAB — COMPREHENSIVE METABOLIC PANEL
AST: 19 U/L (ref 0–37)
Albumin: 4.3 g/dL (ref 3.5–5.2)
Alkaline Phosphatase: 94 U/L (ref 39–117)
Chloride: 101 mEq/L (ref 96–112)
Potassium: 4.2 mEq/L (ref 3.5–5.1)
Sodium: 136 mEq/L (ref 135–145)
Total Bilirubin: 0.2 mg/dL — ABNORMAL LOW (ref 0.3–1.2)

## 2013-01-21 LAB — PREGNANCY, URINE: Preg Test, Ur: NEGATIVE

## 2013-01-21 LAB — LIPASE, BLOOD: Lipase: 52 U/L (ref 11–59)

## 2013-01-21 MED ORDER — MORPHINE SULFATE 4 MG/ML IJ SOLN
6.0000 mg | Freq: Once | INTRAMUSCULAR | Status: AC
  Administered 2013-01-21: 6 mg via INTRAVENOUS
  Filled 2013-01-21: qty 2

## 2013-01-21 MED ORDER — SODIUM CHLORIDE 0.9 % IV SOLN
1000.0000 mL | INTRAVENOUS | Status: DC
  Administered 2013-01-22: 1000 mL via INTRAVENOUS

## 2013-01-21 MED ORDER — ONDANSETRON HCL 4 MG/2ML IJ SOLN
4.0000 mg | Freq: Once | INTRAMUSCULAR | Status: AC
  Administered 2013-01-21: 4 mg via INTRAVENOUS
  Filled 2013-01-21: qty 2

## 2013-01-21 MED ORDER — SODIUM CHLORIDE 0.9 % IV SOLN
1000.0000 mL | Freq: Once | INTRAVENOUS | Status: AC
  Administered 2013-01-21: 1000 mL via INTRAVENOUS

## 2013-01-21 NOTE — ED Provider Notes (Signed)
History     CSN: [id number]  Arrival date & time 01/21/13  1946   First MD Initiated Contact with Patient 01/21/13 2052      Chief Complaint  Patient presents with  . Abdominal Pain     The history is provided by the patient.   patient reports intermittent upper abdominal pain over the past several days with radiation through to her back.  Her symptoms are worsened by food.  She's never been told she has gallstones before.  She has nausea associated with her symptoms without vomiting.  She denies diarrhea.  No fevers or chills.  Her pain is mild to moderate in severity at this time it when her pain is most severe her pain is a 10 out of 10.  Past Medical History  Diagnosis Date  . Chlamydia 2007  . Infection     Past Surgical History  Procedure Laterality Date  . Dilation and curettage of uterus    . Colonoscopy  2012  . Upper gastrointestinal endoscopy  2012    Family History  Problem Relation Age of Onset  . Anesthesia problems Neg Hx     History  Substance Use Topics  . Smoking status: Never Smoker   . Smokeless tobacco: Never Used  . Alcohol Use: No    OB History   Grav Para Term Preterm Abortions TAB SAB Ect Mult Living   3 2   1  1   2       Review of Systems  Gastrointestinal: Positive for abdominal pain.  All other systems reviewed and are negative.    Allergies  Review of patient's allergies indicates no known allergies.  Home Medications   Current Outpatient Rx  Name  Route  Sig  Dispense  Refill  . acetaminophen (TYLENOL) 500 MG tablet   Oral   Take 1,000 mg by mouth every 6 (six) hours as needed. For pain.         [name] orlistat (ALLI) 60 MG capsule   Oral   Take 60 mg by mouth 3 (three) times daily with meals.           BP 103/67  Pulse 81  Resp 18  SpO2 99%  LMP 01/18/2013  Physical Exam  Nursing note and vitals reviewed. Constitutional: She is oriented to person, place, and time. She appears well-developed and  well-nourished. No distress.  HENT:  Head: Normocephalic and atraumatic.  Eyes: EOM are normal.  Neck: Normal range of motion.  Cardiovascular: Normal rate, regular rhythm and normal heart sounds.   Mild epigastric tenderness without guarding or rebound.  No Murphy sign present.  Pulmonary/Chest: Effort normal and breath sounds normal.  Abdominal: Soft. She exhibits no distension.  Musculoskeletal: Normal range of motion.  Neurological: She is alert and oriented to person, place, and time.  Skin: Skin is warm and dry.  Psychiatric: She has a normal mood and affect. Judgment normal.    ED Course  Procedures (including critical care time)  Labs Reviewed  URINALYSIS, MICROSCOPIC ONLY - Abnormal; Notable for the following:    APPearance CLOUDY (*)    Hgb urine dipstick LARGE (*)    Leukocytes, UA MODERATE (*)    Bacteria, UA MANY (*)    All other components within normal limits  COMPREHENSIVE METABOLIC PANEL - Abnormal; Notable for the following:    Total Bilirubin 0.2 (*)    All other components within normal limits  URINE CULTURE  CBC WITH DIFFERENTIAL  LIPASE, BLOOD  PREGNANCY, URINE   US Abdomen Complete  01/22/2013  *RADIOLOGY REPORT*  Clinical Data:  Right upper quadrant abdominal pain  COMPLETE ABDOMINAL ULTRASOUND  Comparison:  11/30/2010  Findings:  Gallbladder:  No gallstones, gallbladder wall thickening, or pericholecystic fluid.  Common bile duct:  Measures 5 mm in diameter by my measurement, in the upper normal range.  No directly visualized choledocholithiasis.  No intrahepatic biliary dilatation.  Liver:  No focal lesion identified.  Within normal limits in parenchymal echogenicity.  IVC:  Appears normal.  Pancreas:  Visualized pancreatic head and body appear unremarkable. Nonvisualization of pancreatic tail due to overlying bowel gas.  Spleen:  Measures 8.6 cm craniocaudad and appears normal.  Right Kidney:  Measures 9.7 cm in length by my measurement and appears normal.   Left Kidney:  Measures 10.0 cm in length and appears normal.  Abdominal aorta:  Abdominal aorta unremarkable; bifurcation not visualized due to overlying bowel gas.  IMPRESSION:  1.  The aortic bifurcation and pancreatic tail are not well seen due to overlying bowel gas. 2.  Borderline prominence CBD at 5 millimeters, but without directly visualized choledocholithiasis.  No intrahepatic biliary dilatation.   Original Report Authenticated By: Gaylyn Rong, M.D.    I personally reviewed the imaging tests through PACS system I reviewed available ER/hospitalization records through the EMR   No diagnosis found.    MDM  12:26 AM Patient continues to have some nausea and pain at this time.  Ultrasound is not definitive for anything such as cholelithiasis.  There is questionable borderline prominence of her common bile duct around 5 mm.  Her LFTs and lipase are normal.  Given her ongoing pain CT scan will be performed to further evaluate.  Care to Dr Kristopher Glee, MD 01/22/13 [phone]

## 2013-01-21 NOTE — ED Notes (Signed)
Ultrasound ongoing at bedside.

## 2013-01-21 NOTE — ED Notes (Signed)
Pt c/o abd pain started Friday. Pain has got worse today radiating to back.

## 2013-01-22 ENCOUNTER — Emergency Department (HOSPITAL_COMMUNITY): Payer: Medicaid Other

## 2013-01-22 ENCOUNTER — Encounter (HOSPITAL_COMMUNITY): Payer: Self-pay | Admitting: Internal Medicine

## 2013-01-22 DIAGNOSIS — R109 Unspecified abdominal pain: Secondary | ICD-10-CM | POA: Diagnosis present

## 2013-01-22 DIAGNOSIS — N12 Tubulo-interstitial nephritis, not specified as acute or chronic: Secondary | ICD-10-CM | POA: Diagnosis present

## 2013-01-22 DIAGNOSIS — R112 Nausea with vomiting, unspecified: Secondary | ICD-10-CM

## 2013-01-22 LAB — CBC
HCT: 32.7 % — ABNORMAL LOW (ref 36.0–46.0)
Hemoglobin: 10.7 g/dL — ABNORMAL LOW (ref 12.0–15.0)
WBC: 8.2 10*3/uL (ref 4.0–10.5)

## 2013-01-22 LAB — BASIC METABOLIC PANEL
Chloride: 104 mEq/L (ref 96–112)
GFR calc Af Amer: >90 mL/min (ref >90)
Potassium: 3.9 mEq/L (ref 3.5–5.1)

## 2013-01-22 MED ORDER — ONDANSETRON HCL 4 MG/2ML IJ SOLN
4.0000 mg | Freq: Four times a day (QID) | INTRAMUSCULAR | Status: DC | PRN
  Administered 2013-01-22 – 2013-01-27 (×6): 4 mg via INTRAVENOUS
  Filled 2013-01-22 (×6): qty 2

## 2013-01-22 MED ORDER — ACETAMINOPHEN 650 MG RE SUPP: 650.0000 mg | Freq: Four times a day (QID) | RECTAL | Status: DC | PRN

## 2013-01-22 MED ORDER — ONDANSETRON HCL 4 MG PO TABS: 4.0000 mg | ORAL_TABLET | Freq: Four times a day (QID) | ORAL | Status: DC | PRN

## 2013-01-22 MED ORDER — PROMETHAZINE HCL 25 MG/ML IJ SOLN
12.5000 mg | Freq: Four times a day (QID) | INTRAMUSCULAR | Status: DC | PRN
  Administered 2013-01-22 – 2013-01-24 (×5): 12.5 mg via INTRAVENOUS
  Administered 2013-01-24 – 2013-01-27 (×9): 25 mg via INTRAVENOUS
  Administered 2013-01-27: 12.5 mg via INTRAVENOUS
  Administered 2013-01-28: 25 mg via INTRAVENOUS
  Filled 2013-01-22 (×16): qty 1

## 2013-01-22 MED ORDER — SODIUM CHLORIDE 0.9 % IV SOLN
INTRAVENOUS | Status: AC
  Administered 2013-01-22: 02:00:00 via INTRAVENOUS

## 2013-01-22 MED ORDER — PANTOPRAZOLE SODIUM 40 MG IV SOLR
40.0000 mg | INTRAVENOUS | Status: DC
  Administered 2013-01-22 – 2013-01-26 (×5): 40 mg via INTRAVENOUS
  Filled 2013-01-22 (×6): qty 40

## 2013-01-22 MED ORDER — ONDANSETRON HCL 4 MG/2ML IJ SOLN: 4.0000 mg | Freq: Three times a day (TID) | INTRAMUSCULAR | Status: DC | PRN

## 2013-01-22 MED ORDER — HYDROMORPHONE HCL PF 1 MG/ML IJ SOLN
1.0000 mg | INTRAMUSCULAR | Status: AC | PRN
  Administered 2013-01-22 (×2): 1 mg via INTRAVENOUS
  Filled 2013-01-22 (×2): qty 1

## 2013-01-22 MED ORDER — ACETAMINOPHEN 325 MG PO TABS
650.0000 mg | ORAL_TABLET | Freq: Four times a day (QID) | ORAL | Status: DC | PRN
  Administered 2013-01-22: 650 mg via ORAL
  Filled 2013-01-22: qty 2

## 2013-01-22 MED ORDER — PROMETHAZINE HCL 25 MG/ML IJ SOLN
25.0000 mg | Freq: Once | INTRAMUSCULAR | Status: AC
  Administered 2013-01-22: 25 mg via INTRAMUSCULAR
  Filled 2013-01-22: qty 1

## 2013-01-22 MED ORDER — IOHEXOL 300 MG/ML  SOLN
100.0000 mL | Freq: Once | INTRAMUSCULAR | Status: AC | PRN
  Administered 2013-01-22: 100 mL via INTRAVENOUS

## 2013-01-22 MED ORDER — IOHEXOL 300 MG/ML  SOLN: 50.0000 mL | Freq: Once | INTRAMUSCULAR | Status: DC | PRN

## 2013-01-22 MED ORDER — HYDROMORPHONE HCL PF 1 MG/ML IJ SOLN
1.0000 mg | INTRAMUSCULAR | Status: DC | PRN
  Administered 2013-01-22 – 2013-01-28 (×27): 1 mg via INTRAVENOUS
  Filled 2013-01-22 (×28): qty 1

## 2013-01-22 MED ORDER — HYDROMORPHONE HCL PF 1 MG/ML IJ SOLN
1.0000 mg | Freq: Once | INTRAMUSCULAR | Status: AC
  Administered 2013-01-22: 1 mg via INTRAVENOUS
  Filled 2013-01-22: qty 1

## 2013-01-22 MED ORDER — DEXTROSE 5 % IV SOLN
1.0000 g | Freq: Every day | INTRAVENOUS | Status: DC
  Administered 2013-01-22 – 2013-01-24 (×3): 1 g via INTRAVENOUS
  Filled 2013-01-22 (×3): qty 10

## 2013-01-22 MED ORDER — ONDANSETRON HCL 4 MG/2ML IJ SOLN
4.0000 mg | Freq: Once | INTRAMUSCULAR | Status: AC
  Administered 2013-01-22: 4 mg via INTRAVENOUS
  Filled 2013-01-22: qty 2

## 2013-01-22 MED ORDER — SODIUM CHLORIDE 0.9 % IV SOLN
INTRAVENOUS | Status: DC
  Administered 2013-01-22 – 2013-01-26 (×8): via INTRAVENOUS

## 2013-01-22 MED ORDER — PANTOPRAZOLE SODIUM 40 MG IV SOLR
40.0000 mg | Freq: Once | INTRAVENOUS | Status: AC
  Administered 2013-01-22: 40 mg via INTRAVENOUS
  Filled 2013-01-22: qty 40

## 2013-01-22 MED ORDER — MORPHINE SULFATE 4 MG/ML IJ SOLN
6.0000 mg | Freq: Once | INTRAMUSCULAR | Status: AC
  Administered 2013-01-22: 6 mg via INTRAVENOUS
  Filled 2013-01-22: qty 2

## 2013-01-22 MED ORDER — INFLUENZA VIRUS VACC SPLIT PF IM SUSP
0.5000 mL | Freq: Once | INTRAMUSCULAR | Status: AC
  Administered 2013-01-22: 0.5 mL via INTRAMUSCULAR
  Filled 2013-01-22: qty 0.5

## 2013-01-22 NOTE — ED Provider Notes (Signed)
2:08 AM CT scan reviewed and PT evaluated, still having N/V and sig epigastric pain, repeated IV narcotics, antiemetics and IV protonix provided. MED consulted for admit  2:28 AM d/w Dr Toniann Fail - will admit  Results for orders placed during the hospital encounter of 01/21/13  CBC WITH DIFFERENTIAL      Result Value Range   WBC 7.4  4.0 - 10.5 K/uL   RBC 4.84  3.87 - 5.11 MIL/uL   Hemoglobin 12.8  12.0 - 15.0 g/dL   HCT 47.8  29.5 - 62.1 %   MCV 80.8  78.0 - 100.0 fL   MCH 26.4  26.0 - 34.0 pg   MCHC 32.7  30.0 - 36.0 g/dL   RDW 30.8  65.7 - 84.6 %   Platelets 301  150 - 400 K/uL   Neutrophils Relative 52  43 - 77 %   Neutro Abs 3.8  1.7 - 7.7 K/uL   Lymphocytes Relative 39  12 - 46 %   Lymphs Abs 2.9  0.7 - 4.0 K/uL   Monocytes Relative 8  3 - 12 %   Monocytes Absolute 0.6  0.1 - 1.0 K/uL   Eosinophils Relative 1  0 - 5 %   Eosinophils Absolute 0.1  0.0 - 0.7 K/uL   Basophils Relative 0  0 - 1 %   Basophils Absolute 0.0  0.0 - 0.1 K/uL  URINALYSIS, MICROSCOPIC ONLY      Result Value Range   Color, Urine YELLOW  YELLOW   APPearance CLOUDY (*) CLEAR   Specific Gravity, Urine 1.024  1.005 - 1.030   pH 6.0  5.0 - 8.0   Glucose, UA NEGATIVE  NEGATIVE mg/dL   Hgb urine dipstick LARGE (*) NEGATIVE   Bilirubin Urine NEGATIVE  NEGATIVE   Ketones, ur NEGATIVE  NEGATIVE mg/dL   Protein, ur NEGATIVE  NEGATIVE mg/dL   Urobilinogen, UA 1.0  0.0 - 1.0 mg/dL   Nitrite NEGATIVE  NEGATIVE   Leukocytes, UA MODERATE (*) NEGATIVE   WBC, UA 7-10  <3 WBC/hpf   RBC / HPF 7-10  <3 RBC/hpf   Bacteria, UA MANY (*) RARE   Squamous Epithelial / LPF RARE  RARE   Urine-Other LESS THAN 10 mL OF URINE SUBMITTED    LIPASE, BLOOD      Result Value Range   Lipase 52  11 - 59 U/L  COMPREHENSIVE METABOLIC PANEL      Result Value Range   Sodium 136  135 - 145 mEq/L   Potassium 4.2  3.5 - 5.1 mEq/L   Chloride 101  96 - 112 mEq/L   CO2 25  19 - 32 mEq/L   Glucose, Bld 87  70 - 99 mg/dL   BUN 12  6 -  23 mg/dL   Creatinine, Ser 9.62  0.50 - 1.10 mg/dL   Calcium 9.5  8.4 - 95.2 mg/dL   Total Protein 8.2  6.0 - 8.3 g/dL   Albumin 4.3  3.5 - 5.2 g/dL   AST 19  0 - 37 U/L   ALT 17  0 - 35 U/L   Alkaline Phosphatase 94  39 - 117 U/L   Total Bilirubin 0.2 (*) 0.3 - 1.2 mg/dL   GFR calc non Af Amer >90  >90 mL/min   GFR calc Af Amer >90  >90 mL/min  PREGNANCY, URINE      Result Value Range   Preg Test, Ur NEGATIVE  NEGATIVE   US Abdomen Complete  01/22/2013  *RADIOLOGY REPORT*  Clinical Data:  Right upper quadrant abdominal pain  COMPLETE ABDOMINAL ULTRASOUND  Comparison:  11/30/2010  Findings:  Gallbladder:  No gallstones, gallbladder wall thickening, or pericholecystic fluid.  Common bile duct:  Measures 5 mm in diameter by my measurement, in the upper normal range.  No directly visualized choledocholithiasis.  No intrahepatic biliary dilatation.  Liver:  No focal lesion identified.  Within normal limits in parenchymal echogenicity.  IVC:  Appears normal.  Pancreas:  Visualized pancreatic head and body appear unremarkable. Nonvisualization of pancreatic tail due to overlying bowel gas.  Spleen:  Measures 8.6 cm craniocaudad and appears normal.  Right Kidney:  Measures 9.7 cm in length by my measurement and appears normal.  Left Kidney:  Measures 10.0 cm in length and appears normal.  Abdominal aorta:  Abdominal aorta unremarkable; bifurcation not visualized due to overlying bowel gas.  IMPRESSION:  1.  The aortic bifurcation and pancreatic tail are not well seen due to overlying bowel gas. 2.  Borderline prominence CBD at 5 millimeters, but without directly visualized choledocholithiasis.  No intrahepatic biliary dilatation.   Original Report Authenticated By: Gaylyn Rong, M.D.    Ct Abdomen Pelvis W Contrast  01/22/2013  *RADIOLOGY REPORT*  Clinical Data: Abdominal pain radiating to the back.  Nausea.  CT ABDOMEN AND PELVIS WITH CONTRAST  Technique:  Multidetector CT imaging of the abdomen  and pelvis was performed following the standard protocol during bolus administration of intravenous contrast.  Contrast: OMNIPAQUE IOHEXOL 300 MG/ML  SOLN  Comparison: Ultrasound dated 01/21/2013  Findings: No overt biliary dilatation observed.  Liver unremarkable.  Hypodense splenic lesion, 1.0 x 0.9 cm on image 9 of series 2, nonspecific but likely benign.  The adrenal glands and pancreas appear normal.  Two small calculi in the left kidney lower pole each measure about 2 mm in diameter. These were not readily apparent on the recent ultrasound.  No hydronephrosis or ureteral calculus.  Urinary bladder unremarkable. No right-sided renal calculi.  The appendix appears normal. No pathologic retroperitoneal or porta hepatis adenopathy is identified.  No pathologic pelvic adenopathy is identified.  Uterus and adnexa unremarkable.  IMPRESSION:  1.  There are to 2 mm nonobstructive left kidney lower pole calculi. 2.  Small hypodense lesion in the upper portion of the spleen, potentially a small cyst or hemangioma. 3.  A specific cause for the patient's upper abdominal pain is not identified.   Original Report Authenticated By: Gaylyn Rong, M.D.       Sunnie Nielsen, MD 01/22/13 [id number]

## 2013-01-22 NOTE — Care Management Note (Signed)
    Page 1 of 1   01/22/2013     11:28:40 AM   CARE MANAGEMENT NOTE 01/22/2013  Patient:  Julia [name], Julia [name]   Account Number:  [id number]  Date Initiated:  01/22/2013  Documentation initiated by:  Lorenda Ishihara  Subjective/Objective Assessment:   27 yo female admitted with abd pain, nausea, vomiting. PTA lived at home with spouse.     Action/Plan:   Home when stable   Anticipated DC Date:  01/25/2013   Anticipated DC Plan:  HOME/SELF CARE      DC Planning Services  CM consult      Choice offered to / List presented to:             Status of service:  Completed, signed off Medicare Important Message given?  NO (If response is "NO", the following Medicare IM given date fields will be blank) Date Medicare IM given:   Date Additional Medicare IM given:    Discharge Disposition:  HOME/SELF CARE  Per UR Regulation:  Reviewed for med. necessity/level of care/duration of stay  If discussed at Long Length of Stay Meetings, dates discussed:    Comments:

## 2013-01-22 NOTE — Progress Notes (Addendum)
[facility] PROGRESS NOTE  Julia [name] WUJ:[id number] DOB: [birth year] DOA: 01/21/2013 PCP: No primary provider on file.  Brief narrative:  Addendum to admission note done today 01/23/2013 27 year-old female with no significant past medical history presented to the ED due to complaints of persistent nausea, vomiting and abdominal pain.  Evaluation in ED included CT abdomen/pelvis which revealed 2 small left renal calculi (about 2 mm in diameter). Abdominal US showed CBD dilated at 5 mm but no choledocholithiasis. Patient was started on rocephin on admission.  Assessment/Plan:  Principal Problem:   *Abdominal pain, Nausea and Vomiting  Secondary  to pyelonephritis  Continue rocephin  Follow up urine culture results  Continue IV fluids, analgesia and antiemetics Active Problems:   Pyelonephritis  Management as above  Code Status: full code Family Communication: no family at bedside Disposition Plan: home when stable, likely in am  Manson Passey, MD  [facility] Pager [phone]  If 7PM-7AM, please contact night-coverage [email or web address] Password [email or web address] 01/22/2013, 4:24 PM   LOS: 1 day   Consultants:  None   Procedures:  None   Antibiotics:  Rocephin 01/21/2013 -->  HPI/Subjective: Still left back pain, improving.  Objective: Filed Vitals:   01/21/13 2133 01/22/13 0414 01/22/13 0600 01/22/13 1400  BP: 103/67 117/84 98/60 100/69  Pulse: 81 88 73 62  Temp:  98 F (36.7 C) 98.1 F (36.7 C) 98 F (36.7 C)  TempSrc:  Oral Oral Oral  Resp: 18 18 18 18   Height:  5' (1.524 m)    Weight:  62.3 kg (137 lb 5.6 oz)    SpO2: 99% 99% 98% 99%    Intake/Output Summary (Last 24 hours) at 01/22/13 1624 Last data filed at 01/22/13 1400  Gross per 24 hour  Intake 2104.58 ml  Output    675 ml  Net 1429.58 ml    Exam:   General:  Pt is alert, follows commands appropriately, not in acute distress  Cardiovascular: Regular rate and rhythm, S1/S2, no murmurs, no  rubs, no gallops  Respiratory: Clear to auscultation bilaterally, no wheezing, no crackles, no rhonchi  Abdomen: Soft, non tender, non distended, bowel sounds present, no guarding; slight CVAT  Extremities: No edema, pulses DP and PT palpable bilaterally  Neuro: Grossly nonfocal  Data Reviewed: Basic Metabolic Panel:  Recent Labs Lab 01/21/13 2042 01/22/13 0520  NA 136 134*  K 4.2 3.9  CL 101 104  CO2 25 22  GLUCOSE 87 107*  BUN 12 7  CREATININE 0.84 0.66  CALCIUM 9.5 7.5*   Liver Function Tests:  Recent Labs Lab 01/21/13 2042  AST 19  ALT 17  ALKPHOS 94  BILITOT 0.2*  PROT 8.2  ALBUMIN 4.3    Recent Labs Lab 01/21/13 2042  LIPASE 52   No results found for this basename: AMMONIA,  in the last 168 hours CBC:  Recent Labs Lab 01/21/13 2042 01/22/13 0520  WBC 7.4 8.2  NEUTROABS 3.8  --   HGB 12.8 10.7*  HCT 39.1 32.7*  MCV 80.8 81.1  PLT 301 236   Cardiac Enzymes: No results found for this basename: CKTOTAL, CKMB, CKMBINDEX, TROPONINI,  in the last 168 hours BNP: No components found with this basename: POCBNP,  CBG: No results found for this basename: GLUCAP,  in the last 168 hours  No results found for this or any previous visit (from the past 240 hour(s)).   Studies: US Abdomen Complete 01/22/2013  * IMPRESSION:  1.  The aortic bifurcation and pancreatic tail are  not well seen due to overlying bowel gas. 2.  Borderline prominence CBD at 5 millimeters, but without directly visualized choledocholithiasis.  No intrahepatic biliary dilatation.   Original Report Authenticated By: Gaylyn Rong, M.D.    Ct Abdomen Pelvis W Contrast 01/22/2013 IMPRESSION:  1.  There are to 2 mm nonobstructive left kidney lower pole calculi. 2.  Small hypodense lesion in the upper portion of the spleen, potentially a small cyst or hemangioma. 3.  A specific cause for the patient's upper abdominal pain is not identified.   Original Report Authenticated By: Gaylyn Rong, M.D.     Scheduled Meds: . cefTRIAXone (ROCEPHIN)  IV  1 g Intravenous Q0600  . pantoprazole (PROTONIX) IV  40 mg Intravenous Q24H   Continuous Infusions: . sodium chloride 100 mL/hr at 01/22/13 1236

## 2013-01-22 NOTE — H&P (Signed)
Julia [name] is an 27 y.o. female. Patient was seen and examined on January 22, 2013. PCP - none.   Chief Complaint: Abdominal pain with nausea and vomiting. HPI: 27 year-old female with no significant past medical history presented to the year because of persistent nausea vomiting with abdominal pain. Patient's symptoms started 2-3 days ago with epigastric pain radiating to the back. Over the last 24 hours patient has had multiple episodes of nausea and vomiting. Patient's symptoms worsens with food. In the ER sonogram of the abdomen was done which showed mildly dilated CBD and CT abdomen and pelvis was done which was unremarkable. Since patient has had multiple episodes of nausea and vomiting and still has abdominal pain patient has been a minute for further management.  Past Medical History  Diagnosis Date  . Chlamydia 2007  . Infection     Past Surgical History  Procedure Laterality Date  . Dilation and curettage of uterus    . Colonoscopy  2012  . Upper gastrointestinal endoscopy  2012    Family History  Problem Relation Age of Onset  . Anesthesia problems Neg Hx   . Diabetes type II Mother    Social History:  reports that she has never smoked. She has never used smokeless tobacco. She reports that she does not drink alcohol or use illicit drugs.  Allergies: No Known Allergies   (Not in a hospital admission)  Results for orders placed during the hospital encounter of 01/21/13 (from the past 48 hour(s))  CBC WITH DIFFERENTIAL     Status: None   Collection Time    01/21/13  8:42 PM      Result Value Range   WBC 7.4  4.0 - 10.5 K/uL   RBC 4.84  3.87 - 5.11 MIL/uL   Hemoglobin 12.8  12.0 - 15.0 g/dL   HCT 91.4  78.2 - 95.6 %   MCV 80.8  78.0 - 100.0 fL   MCH 26.4  26.0 - 34.0 pg   MCHC 32.7  30.0 - 36.0 g/dL   RDW 21.3  08.6 - 57.8 %   Platelets 301  150 - 400 K/uL   Neutrophils Relative 52  43 - 77 %   Neutro Abs 3.8  1.7 - 7.7 K/uL   Lymphocytes Relative  39  12 - 46 %   Lymphs Abs 2.9  0.7 - 4.0 K/uL   Monocytes Relative 8  3 - 12 %   Monocytes Absolute 0.6  0.1 - 1.0 K/uL   Eosinophils Relative 1  0 - 5 %   Eosinophils Absolute 0.1  0.0 - 0.7 K/uL   Basophils Relative 0  0 - 1 %   Basophils Absolute 0.0  0.0 - 0.1 K/uL  LIPASE, BLOOD     Status: None   Collection Time    01/21/13  8:42 PM      Result Value Range   Lipase 52  11 - 59 U/L  COMPREHENSIVE METABOLIC PANEL     Status: Abnormal   Collection Time    01/21/13  8:42 PM      Result Value Range   Sodium 136  135 - 145 mEq/L   Potassium 4.2  3.5 - 5.1 mEq/L   Chloride 101  96 - 112 mEq/L   CO2 25  19 - 32 mEq/L   Glucose, Bld 87  70 - 99 mg/dL   BUN 12  6 - 23 mg/dL   Creatinine, Ser 4.69  0.50 - 1.10 mg/dL  Calcium 9.5  8.4 - 10.5 mg/dL   Total Protein 8.2  6.0 - 8.3 g/dL   Albumin 4.3  3.5 - 5.2 g/dL   AST 19  0 - 37 U/L   ALT 17  0 - 35 U/L   Alkaline Phosphatase 94  39 - 117 U/L   Total Bilirubin 0.2 (*) 0.3 - 1.2 mg/dL   GFR calc non Af Amer >90  >90 mL/min   GFR calc Af Amer >90  >90 mL/min   Comment:            The eGFR has been calculated     using the CKD EPI equation.     This calculation has not been     validated in all clinical     situations.     eGFR's persistently     <90 mL/min signify     possible Chronic Kidney Disease.  URINALYSIS, MICROSCOPIC ONLY     Status: Abnormal   Collection Time    01/21/13  8:57 PM      Result Value Range   Color, Urine YELLOW  YELLOW   APPearance CLOUDY (*) CLEAR   Specific Gravity, Urine 1.024  1.005 - 1.030   pH 6.0  5.0 - 8.0   Glucose, UA NEGATIVE  NEGATIVE mg/dL   Hgb urine dipstick LARGE (*) NEGATIVE   Bilirubin Urine NEGATIVE  NEGATIVE   Ketones, ur NEGATIVE  NEGATIVE mg/dL   Protein, ur NEGATIVE  NEGATIVE mg/dL   Urobilinogen, UA 1.0  0.0 - 1.0 mg/dL   Nitrite NEGATIVE  NEGATIVE   Leukocytes, UA MODERATE (*) NEGATIVE   WBC, UA 7-10  <3 WBC/hpf   RBC / HPF 7-10  <3 RBC/hpf   Bacteria, UA MANY (*)  RARE   Squamous Epithelial / LPF RARE  RARE   Urine-Other LESS THAN 10 mL OF URINE SUBMITTED    PREGNANCY, URINE     Status: None   Collection Time    01/21/13  8:57 PM      Result Value Range   Preg Test, Ur NEGATIVE  NEGATIVE   Comment:            THE SENSITIVITY OF THIS     METHODOLOGY IS >20 mIU/mL.   US Abdomen Complete  01/22/2013  *RADIOLOGY REPORT*  Clinical Data:  Right upper quadrant abdominal pain  COMPLETE ABDOMINAL ULTRASOUND  Comparison:  11/30/2010  Findings:  Gallbladder:  No gallstones, gallbladder wall thickening, or pericholecystic fluid.  Common bile duct:  Measures 5 mm in diameter by my measurement, in the upper normal range.  No directly visualized choledocholithiasis.  No intrahepatic biliary dilatation.  Liver:  No focal lesion identified.  Within normal limits in parenchymal echogenicity.  IVC:  Appears normal.  Pancreas:  Visualized pancreatic head and body appear unremarkable. Nonvisualization of pancreatic tail due to overlying bowel gas.  Spleen:  Measures 8.6 cm craniocaudad and appears normal.  Right Kidney:  Measures 9.7 cm in length by my measurement and appears normal.  Left Kidney:  Measures 10.0 cm in length and appears normal.  Abdominal aorta:  Abdominal aorta unremarkable; bifurcation not visualized due to overlying bowel gas.  IMPRESSION:  1.  The aortic bifurcation and pancreatic tail are not well seen due to overlying bowel gas. 2.  Borderline prominence CBD at 5 millimeters, but without directly visualized choledocholithiasis.  No intrahepatic biliary dilatation.   Original Report Authenticated By: Gaylyn Rong, M.D.    Ct Abdomen Pelvis W Contrast  01/22/2013  *RADIOLOGY REPORT*  Clinical Data: Abdominal pain radiating to the back.  Nausea.  CT ABDOMEN AND PELVIS WITH CONTRAST  Technique:  Multidetector CT imaging of the abdomen and pelvis was performed following the standard protocol during bolus administration of intravenous contrast.  Contrast:  OMNIPAQUE IOHEXOL 300 MG/ML  SOLN  Comparison: Ultrasound dated 01/21/2013  Findings: No overt biliary dilatation observed.  Liver unremarkable.  Hypodense splenic lesion, 1.0 x 0.9 cm on image 9 of series 2, nonspecific but likely benign.  The adrenal glands and pancreas appear normal.  Two small calculi in the left kidney lower pole each measure about 2 mm in diameter. These were not readily apparent on the recent ultrasound.  No hydronephrosis or ureteral calculus.  Urinary bladder unremarkable. No right-sided renal calculi.  The appendix appears normal. No pathologic retroperitoneal or porta hepatis adenopathy is identified.  No pathologic pelvic adenopathy is identified.  Uterus and adnexa unremarkable.  IMPRESSION:  1.  There are to 2 mm nonobstructive left kidney lower pole calculi. 2.  Small hypodense lesion in the upper portion of the spleen, potentially a small cyst or hemangioma. 3.  A specific cause for the patient's upper abdominal pain is not identified.   Original Report Authenticated By: Gaylyn Rong, M.D.     Review of Systems  Constitutional: Negative.   HENT: Negative.   Eyes: Negative.   Respiratory: Negative.   Cardiovascular: Negative.   Gastrointestinal: Positive for nausea, vomiting and abdominal pain.  Genitourinary: Negative.   Musculoskeletal: Negative.   Skin: Negative.   Neurological: Negative.   Endo/Heme/Allergies: Negative.   Psychiatric/Behavioral: Negative.     Blood pressure 103/67, pulse 81, resp. rate 18, last menstrual period 01/18/2013, SpO2 99.00%. Physical Exam  Constitutional: She is oriented to person, place, and time. She appears well-developed and well-nourished. No distress.  HENT:  Head: Normocephalic and atraumatic.  Right Ear: External ear normal.  Left Ear: External ear normal.  Nose: Nose normal.  Mouth/Throat: Oropharynx is clear and moist. No oropharyngeal exudate.  Eyes: Conjunctivae are normal. Pupils are equal, round, and  reactive to light. Right eye exhibits no discharge. Left eye exhibits no discharge. No scleral icterus.  Neck: Normal range of motion. Neck supple.  Cardiovascular: Normal rate and regular rhythm.   Respiratory: Effort normal and breath sounds normal. No respiratory distress. She has no wheezes. She has no rales.  GI: Soft. Bowel sounds are normal. She exhibits no distension. There is no tenderness. There is no rebound.  Musculoskeletal: She exhibits no edema and no tenderness.  Neurological: She is alert and oriented to person, place, and time.  Skin: She is not diaphoretic.     Assessment/Plan #1. Nausea vomiting and abdominal pain - could be viral etiology. Patient has been placed on clear liquid advance as tolerated. Continue with pain relief medications. IV hydration. #2. Possible UTI - patient has been placed on ceftriaxone. Check urine cultures.  CODE STATUS - full code.  Imanie Darrow N. 01/22/2013, 3:53 AM

## 2013-01-23 DIAGNOSIS — D649 Anemia, unspecified: Secondary | ICD-10-CM | POA: Diagnosis present

## 2013-01-23 LAB — URINE CULTURE
Colony Count: NO GROWTH
Culture: NO GROWTH

## 2013-01-23 NOTE — Progress Notes (Signed)
[facility] PROGRESS NOTE  Julia [name] OZH:[id number] DOB: [birth year] DOA: 01/21/2013 PCP: No primary provider on file.  Assessment/Plan:  #1 abdominal pain Questionable etiology. Patient does have epigastric abdominal pain which is tender to palpation. Patient's right CVA tenderness has improved significantly. Patient states had an episode of emesis yesterday and nausea. Will place on clear liquids. We'll repeat a lipase level. We'll continue IV fluids. Supportive care. We'll consult with GI for further evaluation and management.  #2 probable pyelonephritis  clinical improvement. Urine cultures were no growth. Continue IV Rocephin. Once tolerating oral medications will change to by mouth.  #3 anemia Patient states just started her menstrual cycle. Follow H&H.  #4 nausea and vomiting Likely secondary to problem #1 and 2.  #5 prophylaxis PPI for GI prophylaxis. SCDs for DVT prophylaxis.  Code Status: Full Family Communication: updated patient at bedside. Disposition Plan: Home when medically stable   Consultants:  GI pending  Procedures:  CT abdomen and pelvis 01/22/2013  Abdominal ultrasound 01/21/2013  Antibiotics:  IV Rocephin 01/22/13  HPI/Subjective: Patient c/o epigastric pain. Patient states right CVA pain has improved.  Objective: Filed Vitals:   01/22/13 0600 01/22/13 1400 01/22/13 2220 01/23/13 0608  BP: 98/60 100/69 115/83 103/71  Pulse: 73 62 68 75  Temp: 98.1 F (36.7 C) 98 F (36.7 C) 98 F (36.7 C) 97.9 F (36.6 C)  TempSrc: Oral Oral Oral Oral  Resp: 18 18 18 18   Height:      Weight:      SpO2: 98% 99% 99% 100%    Intake/Output Summary (Last 24 hours) at 01/23/13 1408 Last data filed at 01/23/13 1000  Gross per 24 hour  Intake 3375.41 ml  Output   3700 ml  Net -324.59 ml   Filed Weights   01/22/13 0414  Weight: 62.3 kg (137 lb 5.6 oz)    Exam:   General:  NAD  Cardiovascular: RRR. No LE edema  Respiratory:  CTAB.  Abdomen: Soft/ND/TTP in epigastrium/+BS  Data Reviewed: Basic Metabolic Panel:  Recent Labs Lab 01/21/13 2042 01/22/13 0520  NA 136 134*  K 4.2 3.9  CL 101 104  CO2 25 22  GLUCOSE 87 107*  BUN 12 7  CREATININE 0.84 0.66  CALCIUM 9.5 7.5*   Liver Function Tests:  Recent Labs Lab 01/21/13 2042  AST 19  ALT 17  ALKPHOS 94  BILITOT 0.2*  PROT 8.2  ALBUMIN 4.3    Recent Labs Lab 01/21/13 2042  LIPASE 52   No results found for this basename: AMMONIA,  in the last 168 hours CBC:  Recent Labs Lab 01/21/13 2042 01/22/13 0520  WBC 7.4 8.2  NEUTROABS 3.8  --   HGB 12.8 10.7*  HCT 39.1 32.7*  MCV 80.8 81.1  PLT 301 236   Cardiac Enzymes: No results found for this basename: CKTOTAL, CKMB, CKMBINDEX, TROPONINI,  in the last 168 hours BNP (last 3 results) No results found for this basename: PROBNP,  in the last 8760 hours CBG: No results found for this basename: GLUCAP,  in the last 168 hours  Recent Results (from the past 240 hour(s))  URINE CULTURE     Status: None   Collection Time    01/21/13  8:57 PM      Result Value Range Status   Specimen Description URINE, CLEAN CATCH   Final   Special Requests NONE   Final   Culture  Setup Time 01/22/2013 04:06   Final   Colony Count NO GROWTH  Final   Culture NO GROWTH   Final   Report Status 01/23/2013 FINAL   Final     Studies: US Abdomen Complete  01/22/2013  *RADIOLOGY REPORT*  Clinical Data:  Right upper quadrant abdominal pain  COMPLETE ABDOMINAL ULTRASOUND  Comparison:  11/30/2010  Findings:  Gallbladder:  No gallstones, gallbladder wall thickening, or pericholecystic fluid.  Common bile duct:  Measures 5 mm in diameter by my measurement, in the upper normal range.  No directly visualized choledocholithiasis.  No intrahepatic biliary dilatation.  Liver:  No focal lesion identified.  Within normal limits in parenchymal echogenicity.  IVC:  Appears normal.  Pancreas:  Visualized pancreatic head and  body appear unremarkable. Nonvisualization of pancreatic tail due to overlying bowel gas.  Spleen:  Measures 8.6 cm craniocaudad and appears normal.  Right Kidney:  Measures 9.7 cm in length by my measurement and appears normal.  Left Kidney:  Measures 10.0 cm in length and appears normal.  Abdominal aorta:  Abdominal aorta unremarkable; bifurcation not visualized due to overlying bowel gas.  IMPRESSION:  1.  The aortic bifurcation and pancreatic tail are not well seen due to overlying bowel gas. 2.  Borderline prominence CBD at 5 millimeters, but without directly visualized choledocholithiasis.  No intrahepatic biliary dilatation.   Original Report Authenticated By: Gaylyn Rong, M.D.    Ct Abdomen Pelvis W Contrast  01/22/2013  *RADIOLOGY REPORT*  Clinical Data: Abdominal pain radiating to the back.  Nausea.  CT ABDOMEN AND PELVIS WITH CONTRAST  Technique:  Multidetector CT imaging of the abdomen and pelvis was performed following the standard protocol during bolus administration of intravenous contrast.  Contrast: OMNIPAQUE IOHEXOL 300 MG/ML  SOLN  Comparison: Ultrasound dated 01/21/2013  Findings: No overt biliary dilatation observed.  Liver unremarkable.  Hypodense splenic lesion, 1.0 x 0.9 cm on image 9 of series 2, nonspecific but likely benign.  The adrenal glands and pancreas appear normal.  Two small calculi in the left kidney lower pole each measure about 2 mm in diameter. These were not readily apparent on the recent ultrasound.  No hydronephrosis or ureteral calculus.  Urinary bladder unremarkable. No right-sided renal calculi.  The appendix appears normal. No pathologic retroperitoneal or porta hepatis adenopathy is identified.  No pathologic pelvic adenopathy is identified.  Uterus and adnexa unremarkable.  IMPRESSION:  1.  There are to 2 mm nonobstructive left kidney lower pole calculi. 2.  Small hypodense lesion in the upper portion of the spleen, potentially a small cyst or  hemangioma. 3.  A specific cause for the patient's upper abdominal pain is not identified.   Original Report Authenticated By: Gaylyn Rong, M.D.     Scheduled Meds: . cefTRIAXone (ROCEPHIN)  IV  1 g Intravenous Q0600  . pantoprazole (PROTONIX) IV  40 mg Intravenous Q24H   Continuous Infusions: . sodium chloride 125 mL/hr at 01/23/13 0747    Principal Problem:   Abdominal pain Active Problems:   Nausea and vomiting   Pyelonephritis   Anemia    Time spent: > 35 MINS    [facility]  [facility] Pager [phone]. If 8PM-8AM, please contact night-coverage at [email or web address], password [facility] 01/23/2013, 2:08 PM  LOS: 2 days

## 2013-01-24 LAB — LIPID PANEL
LDL Cholesterol: 99 mg/dL (ref 0–99)
Triglycerides: 94 mg/dL (ref <150)

## 2013-01-24 LAB — COMPREHENSIVE METABOLIC PANEL
AST: 18 U/L (ref 0–37)
BUN: 5 mg/dL — ABNORMAL LOW (ref 6–23)
CO2: 26 mEq/L (ref 19–32)
Chloride: 105 mEq/L (ref 96–112)
Creatinine, Ser: 0.89 mg/dL (ref 0.50–1.10)
GFR calc non Af Amer: 87 mL/min — ABNORMAL LOW (ref >90)
Total Bilirubin: 0.2 mg/dL — ABNORMAL LOW (ref 0.3–1.2)

## 2013-01-24 LAB — CBC
HCT: 34.7 % — ABNORMAL LOW (ref 36.0–46.0)
MCV: 81.5 fL (ref 78.0–100.0)
RBC: 4.26 MIL/uL (ref 3.87–5.11)
WBC: 6 10*3/uL (ref 4.0–10.5)

## 2013-01-24 MED ORDER — CIPROFLOXACIN HCL 500 MG PO TABS
500.0000 mg | ORAL_TABLET | Freq: Two times a day (BID) | ORAL | Status: DC
  Administered 2013-01-25 – 2013-01-28 (×7): 500 mg via ORAL
  Filled 2013-01-24 (×10): qty 1

## 2013-01-24 NOTE — Consult Note (Signed)
[facility] Consult Note  Referring Provider: No ref. provider found Primary Care Physician:  No primary provider on file. Primary Gastroenterologist:  Dr.  Antony Contras Complaint: Epigastric pain nausea and vomiting HPI: Julia [name] is an 27 y.o. Latino female  who presents with a 2-3 day history of epigastric pain radiating to the back associated with nausea and vomiting. Her liver function tests and lipase were unrevealing. Abdominal ultrasound and CT scan of the abdomen were relatively unrevealing for sources of pain.  The patient underwent workup for similar presentation in 2012 and underwent EGD which showed minimal gastritis and no H. pylori. She also had a colonoscopy which was unrevealing. She was treated with Prevacid and improving did well until 2 or 3 days ago. She denies any nonsteroidal anti-inflammatory drugs and is currently not taking any antipeptic medication. She denies diarrhea and actually has chronic constipation which has not been bothering her too badly lately.  Past Medical History  Diagnosis Date  . Chlamydia 2007  . Infection   . Anxiety     Past Surgical History  Procedure Laterality Date  . Dilation and curettage of uterus    . Colonoscopy  2012  . Upper gastrointestinal endoscopy  2012    Medications Prior to Admission  Medication Sig Dispense Refill  . acetaminophen (TYLENOL) 500 MG tablet Take 1,000 mg by mouth every 6 (six) hours as needed. For pain.      [name] orlistat (ALLI) 60 MG capsule Take 60 mg by mouth 3 (three) times daily with meals.        Allergies: No Known Allergies  Family History  Problem Relation Age of Onset  . Anesthesia problems Neg Hx   . Diabetes type II Mother   . Hypertension Mother     Social History:  reports that she has never smoked. She has never used smokeless tobacco. She reports that she does not drink alcohol or use illicit drugs.  Review of Systems: negative except as above   Blood pressure  109/76, pulse 81, temperature 97.7 F (36.5 C), temperature source Oral, resp. rate 16, height 5' (1.524 m), weight 62.3 kg (137 lb 5.6 oz), last menstrual period 01/18/2013, SpO2 99.00%. Head: Normocephalic, without obvious abnormality, atraumatic Neck: no adenopathy, no carotid bruit, no JVD, supple, symmetrical, trachea midline and thyroid not enlarged, symmetric, no tenderness/mass/nodules Resp: clear to auscultation bilaterally Cardio: regular rate and rhythm, S1, S2 normal, no murmur, click, rub or gallop GI: Abdomen soft, moderately tender in epigastrium no guarding or rebound Extremities: extremities normal, atraumatic, no cyanosis or edema  Results for orders placed during the hospital encounter of 01/21/13 (from the past 48 hour(s))  LIPASE, BLOOD     Status: None   Collection Time    01/23/13  2:31 PM      Result Value Range   Lipase 44  11 - 59 U/L  CBC     Status: Abnormal   Collection Time    01/24/13  4:45 AM      Result Value Range   WBC 6.0  4.0 - 10.5 K/uL   Comment: WHITE COUNT CONFIRMED ON SMEAR   RBC 4.26  3.87 - 5.11 MIL/uL   Hemoglobin 11.3 (*) 12.0 - 15.0 g/dL   HCT 91.4 (*) 78.2 - 95.6 %   MCV 81.5  78.0 - 100.0 fL   MCH 26.5  26.0 - 34.0 pg   MCHC 32.6  30.0 - 36.0 g/dL   RDW 21.3  08.6 - 57.8 %  Platelets 216  150 - 400 K/uL  COMPREHENSIVE METABOLIC PANEL     Status: Abnormal   Collection Time    01/24/13  4:45 AM      Result Value Range   Sodium 138  135 - 145 mEq/L   Potassium 3.7  3.5 - 5.1 mEq/L   Chloride 105  96 - 112 mEq/L   CO2 26  19 - 32 mEq/L   Glucose, Bld 86  70 - 99 mg/dL   BUN 5 (*) 6 - 23 mg/dL   Creatinine, Ser 8.11  0.50 - 1.10 mg/dL   Calcium 8.7  8.4 - 91.4 mg/dL   Total Protein 6.1  6.0 - 8.3 g/dL   Albumin 3.3 (*) 3.5 - 5.2 g/dL   AST 18  0 - 37 U/L   ALT 13  0 - 35 U/L   Alkaline Phosphatase 74  39 - 117 U/L   Total Bilirubin 0.2 (*) 0.3 - 1.2 mg/dL   GFR calc non Af Amer 87 (*) >90 mL/min   GFR calc Af Amer >90  >90  mL/min   Comment:            The eGFR has been calculated     using the CKD EPI equation.     This calculation has not been     validated in all clinical     situations.     eGFR's persistently     <90 mL/min signify     possible Chronic Kidney Disease.  LIPID PANEL     Status: Abnormal   Collection Time    01/24/13  4:45 AM      Result Value Range   Cholesterol 148  0 - 200 mg/dL   Triglycerides 94  <782 mg/dL   HDL 30 (*) >95 mg/dL   Total CHOL/HDL Ratio 4.9     VLDL 19  0 - 40 mg/dL   LDL Cholesterol 99  0 - 99 mg/dL   Comment:            Total Cholesterol/HDL:CHD Risk     Coronary Heart Disease Risk Table                         Men   Women      1/2 Average Risk   3.4   3.3      Average Risk       5.0   4.4      2 X Average Risk   9.6   7.1      3 X Average Risk  23.4   11.0                Use the calculated Patient Ratio     above and the CHD Risk Table     to determine the patient's CHD Risk.                ATP III CLASSIFICATION (LDL):      <100     mg/dL   Optimal      621-308  mg/dL   Near or Above                        Optimal      130-159  mg/dL   Borderline      657-846  mg/dL   High      >962     mg/dL   Very High  No results found.  Assessment: Epigastric abdominal pain nausea and vomiting worse after eating 2-3 days duration of fairly sudden onset Plan:  Since patient underwent EGD for similar symptoms in the past and this sounds more like biliary colic will obtain CCK stimulated HIDA scan while continuing Protonix. This is negative we'll consider EGD. [name] C 01/24/2013, 10:25 AM

## 2013-01-24 NOTE — Progress Notes (Signed)
[facility] PROGRESS NOTE  Kristain Filo ZOX:[id number] DOB: 11-Feb-1986 DOA: 01/21/2013 PCP: No primary provider on file.  Assessment/Plan:  #1 abdominal pain Questionable etiology. Patient does have epigastric abdominal pain which is tender to palpation. Patient's right CVA tenderness has improved significantly. Patient tolerating clear liquids. Lipase levels within normal limits. Continue IV fluids, pain management. Continue supportive care. Patient has been seen in consultation by gastroenterology and recommended a CCK stimulated HIDA scan. Continue Protonix. Per GI if HIDA scan negative will consider a EGD.  #2 probable pyelonephritis  clinical improvement. Urine cultures with no growth. D/c IV Rocephin. Start oral Cipro for 4 more days to complete a seven-day course of antibiotics.   #3 anemia Patient states just started her menstrual cycle. Follow H&H.  #4 nausea and vomiting Likely secondary to problem #1 and 2.  #5 prophylaxis PPI for GI prophylaxis. SCDs for DVT prophylaxis.  Code Status: Full Family Communication: updated patient at bedside. Disposition Plan: Home when medically stable   Consultants:  GI : Dr. Madilyn Fireman 01/24/2013  Procedures:  CT abdomen and pelvis 01/22/2013  Abdominal ultrasound 01/21/2013  Antibiotics:  IV Rocephin 01/22/13----> 01/24/13  Oral Cipro 01/24/2013  HPI/Subjective: Patient c/o epigastric pain. Tolerating clear liquids. Patient states right CVA pain has improved.  Objective: Filed Vitals:   01/23/13 0608 01/23/13 1400 01/23/13 2123 01/24/13 0453  BP: 103/71 124/76 124/85 109/76  Pulse: 75 102 81 81  Temp: 97.9 F (36.6 C) 98.2 F (36.8 C) 98.6 F (37 C) 97.7 F (36.5 C)  TempSrc: Oral Oral Oral Oral  Resp: 18 16 18 16   Height:      Weight:      SpO2: 100% 100% 100% 99%    Intake/Output Summary (Last 24 hours) at 01/24/13 1159 Last data filed at 01/24/13 0448  Gross per 24 hour  Intake   1120 ml   Output   2850 ml  Net  -1730 ml   Filed Weights   01/22/13 0414  Weight: 62.3 kg (137 lb 5.6 oz)    Exam:   General:  NAD  Cardiovascular: RRR. No LE edema  Respiratory: CTAB.  Abdomen: Soft/ND/TTP in epigastrium/+BS  Data Reviewed: Basic Metabolic Panel:  Recent Labs Lab 01/21/13 2042 01/22/13 0520 01/24/13 0445  NA 136 134* 138  K 4.2 3.9 3.7  CL 101 104 105  CO2 25 22 26   GLUCOSE 87 107* 86  BUN 12 7 5*  CREATININE 0.84 0.66 0.89  CALCIUM 9.5 7.5* 8.7   Liver Function Tests:  Recent Labs Lab 01/21/13 2042 01/24/13 0445  AST 19 18  ALT 17 13  ALKPHOS 94 74  BILITOT 0.2* 0.2*  PROT 8.2 6.1  ALBUMIN 4.3 3.3*    Recent Labs Lab 01/21/13 2042 01/23/13 1431  LIPASE 52 44   No results found for this basename: AMMONIA,  in the last 168 hours CBC:  Recent Labs Lab 01/21/13 2042 01/22/13 0520 01/24/13 0445  WBC 7.4 8.2 6.0  NEUTROABS 3.8  --   --   HGB 12.8 10.7* 11.3*  HCT 39.1 32.7* 34.7*  MCV 80.8 81.1 81.5  PLT 301 236 216   Cardiac Enzymes: No results found for this basename: CKTOTAL, CKMB, CKMBINDEX, TROPONINI,  in the last 168 hours BNP (last 3 results) No results found for this basename: PROBNP,  in the last 8760 hours CBG: No results found for this basename: GLUCAP,  in the last 168 hours  Recent Results (from the past 240 hour(s))  URINE CULTURE  Status: None   Collection Time    01/21/13  8:57 PM      Result Value Range Status   Specimen Description URINE, CLEAN CATCH   Final   Special Requests NONE   Final   Culture  Setup Time 01/22/2013 04:06   Final   Colony Count NO GROWTH   Final   Culture NO GROWTH   Final   Report Status 01/23/2013 FINAL   Final     Studies: No results found.  Scheduled Meds: . cefTRIAXone (ROCEPHIN)  IV  1 g Intravenous Q0600  . pantoprazole (PROTONIX) IV  40 mg Intravenous Q24H   Continuous Infusions: . sodium chloride 125 mL/hr at 01/23/13 1611    Principal Problem:   Abdominal  pain Active Problems:   Nausea and vomiting   Pyelonephritis   Anemia    Time spent: > 35 MINS    [facility]  [facility] Pager [phone]. If 8PM-8AM, please contact night-coverage at [email or web address], password [facility] 01/24/2013, 11:59 AM  LOS: 3 days

## 2013-01-25 ENCOUNTER — Inpatient Hospital Stay (HOSPITAL_COMMUNITY): Payer: Medicaid Other

## 2013-01-25 LAB — BASIC METABOLIC PANEL
BUN: 6 mg/dL (ref 6–23)
CO2: 23 mEq/L (ref 19–32)
Calcium: 8.8 mg/dL (ref 8.4–10.5)
Chloride: 105 mEq/L (ref 96–112)
Creatinine, Ser: 0.79 mg/dL (ref 0.50–1.10)
GFR calc Af Amer: >90 mL/min (ref >90)
GFR calc non Af Amer: >90 mL/min (ref >90)
Glucose, Bld: 87 mg/dL (ref 70–99)
Potassium: 3.5 mEq/L (ref 3.5–5.1)
Sodium: 138 mEq/L (ref 135–145)

## 2013-01-25 LAB — CBC
HCT: 35.3 % — ABNORMAL LOW (ref 36.0–46.0)
Hemoglobin: 11.5 g/dL — ABNORMAL LOW (ref 12.0–15.0)
MCH: 26.2 pg (ref 26.0–34.0)
MCHC: 32.6 g/dL (ref 30.0–36.0)
MCV: 80.4 fL (ref 78.0–100.0)
Platelets: 244 10*3/uL (ref 150–400)
RBC: 4.39 MIL/uL (ref 3.87–5.11)
RDW: 13.7 % (ref 11.5–15.5)
WBC: 5.8 10*3/uL (ref 4.0–10.5)

## 2013-01-25 MED ORDER — TECHNETIUM TC 99M MEBROFENIN IV KIT
5.5000 | PACK | Freq: Once | INTRAVENOUS | Status: AC | PRN
  Administered 2013-01-25: 5.5 via INTRAVENOUS

## 2013-01-25 MED ORDER — SIMETHICONE 80 MG PO CHEW
160.0000 mg | CHEWABLE_TABLET | Freq: Once | ORAL | Status: AC
  Administered 2013-01-25: 160 mg via ORAL
  Filled 2013-01-25: qty 2

## 2013-01-25 MED ORDER — POTASSIUM CHLORIDE CRYS ER 20 MEQ PO TBCR
40.0000 meq | EXTENDED_RELEASE_TABLET | Freq: Once | ORAL | Status: AC
  Administered 2013-01-25: 40 meq via ORAL
  Filled 2013-01-25: qty 2

## 2013-01-25 NOTE — Progress Notes (Signed)
[facility] PROGRESS NOTE Subjective Patient has returned from radiology. The hepatobiliary scan revealed normal gallbladder ejection fraction. Patient had similar symptoms a little over a year ago and had unrevealing EGD and colonoscopy by Dr. Dulce Sellar she has had previous history of renal stone. She states that she feels a little bit better today. She is on Cipro, Dilaudid, protonix, and antinausea medications.  Objective: Vital signs in last 24 hours: Temp:  [97.6 F (36.4 C)-98.2 F (36.8 C)] 98.2 F (36.8 C) (02/14 0540) Pulse Rate:  [70-85] 70 (02/14 0540) Resp:  [16] 16 (02/14 0540) BP: (107-127)/(60-87) 107/60 mmHg (02/14 0540) SpO2:  [99 %-100 %] 100 % (02/14 0540) Last BM Date: 01/21/13  Intake/Output from previous day: 02/13 0701 - 02/14 0700 In: 1380 [P.O.:480; I.V.:900] Out: 2475 [Urine:2475] Intake/Output this shift: Total I/O In: 291.3 [I.V.:291.3] Out: 350 [Urine:350]  PE: Gen. -- patient sitting up eating clear liquids Abdomen -- none distended could bowel sounds mild if any tenderness  Lab Results:  Recent Labs  01/24/13 0445 01/25/13 0507  WBC 6.0 5.8  HGB 11.3* 11.5*  HCT 34.7* 35.3*  PLT 216 244   BMET  Recent Labs  01/24/13 0445 01/25/13 0507  NA 138 138  K 3.7 3.5  CL 105 105  CO2 26 23  CREATININE 0.89 0.79   LFT  Recent Labs  01/24/13 0445  PROT 6.1  AST 18  ALT 13  ALKPHOS 74  BILITOT 0.2*   PT/INR No results found for this basename: LABPROT, INR,  in the last 72 hours PANCREAS  Recent Labs  01/23/13 1431  LIPASE 44         Studies/Results: Nm Hepato W/eject Fract  01/25/2013  *RADIOLOGY REPORT*  Clinical Data:  Recurrent epigastric abdominal pain  NUCLEAR MEDICINE HEPATOBILIARY IMAGING WITH GALLBLADDER EF:  Technique: Sequential images of the abdomen were obtained for 60 minutes following intravenous administration of radiopharmaceutical.  Patient then ingested 8 ounces of commercially available Ensure  Plus and imaging was continued for 60 minutes.  A time-activity curve was generated from tracer within the gallbladder following Ensure Plus ingestion, and the gallbladder ejection fraction was calculated.  Radiopharmaceutical:  5.5 mCi Tc-72m mebrofenin  Comparison: None Correlation:  Ultrasound abdomen 01/21/2013, CT abdomen and pelvis 01/22/2013  Findings:  Prompt tracer extraction from bloodstream, indicating normal hepatocellular function. Prompt excretion of tracer into biliary tree. Gallbladder visualized at 6 minutes. Small bowel visualized at 10 minutes. No hepatic retention of tracer.  Subjectively normal emptying of tracer from gallbladder following fatty meal stimulation. Calculated gallbladder ejection fraction is 41%, normal. Patient experienced no symptoms following fatty meal ingestion.  IMPRESSION: Patent biliary tree. Normal gallbladder response to fatty meal stimulation with a normal gallbladder ejection fraction of 41%.  Normal values for gallbladder ejection fraction: > 30% for exams utilizing sincalide (CCK) > 33% for exams utilizing fatty meal stimulation with Ensure Plus   Original Report Authenticated By: Ulyses Southward, M.D.     Medications: I have reviewed the patient's current medications.  Assessment/Plan: 1. Abdominal pain/nausea. At this point the patient has had CT, ultrasound, HIDA scan would ejection fraction all of which have failed to show any pathology. She has had a fairly extensive workup over the previous several years. She doesn't clinically appear to be improved. At this point, advance her diet and follow her clinically.   Ki Luckman JR,Devon Pretty L 01/25/2013, 11:32 AM

## 2013-01-25 NOTE — Progress Notes (Signed)
[facility] PROGRESS NOTE  Kobe Jansma ZOX:[id number] DOB: 1986/05/20 DOA: 01/21/2013 PCP: No primary provider on file.  Assessment/Plan:  #1 abdominal pain Questionable etiology. Patient does have epigastric abdominal pain which is tender to palpation. Patient also states with oral intake sometimes feels solids may get stuck with some associated abdominal pain as well. Patient's right CVA tenderness has improved significantly. Patient tolerating clear liquids. Lipase levels within normal limits. Continue IV fluids, pain management. Continue supportive care. HIDA scan was done today which was negative. Patient's diet has been advanced to a regular diet. GI following and appreciate input and recommendations.   #2 probable pyelonephritis  clinical improvement. Urine cultures with no growth. Continue oral Cipro for 3 more days to complete a seven-day course of antibiotics.   #3 anemia Patient states just started her menstrual cycle. Follow H&H.  #4 nausea and vomiting Likely secondary to problem #1 and 2.  #5 prophylaxis PPI for GI prophylaxis. SCDs for DVT prophylaxis.  Code Status: Full Family Communication: updated patient at bedside. Disposition Plan: Home when medically stable   Consultants:  GI : Dr. Madilyn Fireman 01/24/2013  Procedures:  CT abdomen and pelvis 01/22/2013  Abdominal ultrasound 01/21/2013   HIDA scan  2/14/ 2014  Antibiotics:  IV Rocephin 01/22/13----> 01/24/13  Oral Cipro 01/24/2013  HPI/Subjective: Patient c/o epigastric pain. Patient stated when she ate she felt nauseous and felt as if food may have been stuck on its way down with some associated abdominal pain. Patient states right CVA pain has improved.  Objective: Filed Vitals:   01/24/13 1355 01/24/13 2203 01/25/13 0540 01/25/13 1400  BP: 127/87 115/79 107/60 115/78  Pulse: 78 85 70 77  Temp: 97.6 F (36.4 C) 97.6 F (36.4 C) 98.2 F (36.8 C) 98.1 F (36.7 C)  TempSrc: Oral Oral  Oral Oral  Resp: 16 16 16 12   Height:      Weight:      SpO2: 100% 99% 100% 100%    Intake/Output Summary (Last 24 hours) at 01/25/13 1500 Last data filed at 01/25/13 1400  Gross per 24 hour  Intake   1740 ml  Output   2425 ml  Net   -685 ml   Filed Weights   01/22/13 0414  Weight: 62.3 kg (137 lb 5.6 oz)    Exam:   General:  NAD  Cardiovascular: RRR. No LE edema  Respiratory: CTAB.  Abdomen: Soft/ND/TTP in epigastrium/+BS  Data Reviewed: Basic Metabolic Panel:  Recent Labs Lab 01/21/13 2042 01/22/13 0520 01/24/13 0445 01/25/13 0507  NA 136 134* 138 138  K 4.2 3.9 3.7 3.5  CL 101 104 105 105  CO2 25 22 26 23   GLUCOSE 87 107* 86 87  BUN 12 7 5* 6  CREATININE 0.84 0.66 0.89 0.79  CALCIUM 9.5 7.5* 8.7 8.8   Liver Function Tests:  Recent Labs Lab 01/21/13 2042 01/24/13 0445  AST 19 18  ALT 17 13  ALKPHOS 94 74  BILITOT 0.2* 0.2*  PROT 8.2 6.1  ALBUMIN 4.3 3.3*    Recent Labs Lab 01/21/13 2042 01/23/13 1431  LIPASE 52 44   No results found for this basename: AMMONIA,  in the last 168 hours CBC:  Recent Labs Lab 01/21/13 2042 01/22/13 0520 01/24/13 0445 01/25/13 0507  WBC 7.4 8.2 6.0 5.8  NEUTROABS 3.8  --   --   --   HGB 12.8 10.7* 11.3* 11.5*  HCT 39.1 32.7* 34.7* 35.3*  MCV 80.8 81.1 81.5 80.4  PLT 301 236  216 244   Cardiac Enzymes: No results found for this basename: CKTOTAL, CKMB, CKMBINDEX, TROPONINI,  in the last 168 hours BNP (last 3 results) No results found for this basename: PROBNP,  in the last 8760 hours CBG: No results found for this basename: GLUCAP,  in the last 168 hours  Recent Results (from the past 240 hour(s))  URINE CULTURE     Status: None   Collection Time    01/21/13  8:57 PM      Result Value Range Status   Specimen Description URINE, CLEAN CATCH   Final   Special Requests NONE   Final   Culture  Setup Time 01/22/2013 04:06   Final   Colony Count NO GROWTH   Final   Culture NO GROWTH   Final    Report Status 01/23/2013 FINAL   Final     Studies: Nm Hepato W/eject Fract  01/25/2013  *RADIOLOGY REPORT*  Clinical Data:  Recurrent epigastric abdominal pain  NUCLEAR MEDICINE HEPATOBILIARY IMAGING WITH GALLBLADDER EF:  Technique: Sequential images of the abdomen were obtained for 60 minutes following intravenous administration of radiopharmaceutical.  Patient then ingested 8 ounces of commercially available Ensure Plus and imaging was continued for 60 minutes.  A time-activity curve was generated from tracer within the gallbladder following Ensure Plus ingestion, and the gallbladder ejection fraction was calculated.  Radiopharmaceutical:  5.5 mCi Tc-56m mebrofenin  Comparison: None Correlation:  Ultrasound abdomen 01/21/2013, CT abdomen and pelvis 01/22/2013  Findings:  Prompt tracer extraction from bloodstream, indicating normal hepatocellular function. Prompt excretion of tracer into biliary tree. Gallbladder visualized at 6 minutes. Small bowel visualized at 10 minutes. No hepatic retention of tracer.  Subjectively normal emptying of tracer from gallbladder following fatty meal stimulation. Calculated gallbladder ejection fraction is 41%, normal. Patient experienced no symptoms following fatty meal ingestion.  IMPRESSION: Patent biliary tree. Normal gallbladder response to fatty meal stimulation with a normal gallbladder ejection fraction of 41%.  Normal values for gallbladder ejection fraction: > 30% for exams utilizing sincalide (CCK) > 33% for exams utilizing fatty meal stimulation with Ensure Plus   Original Report Authenticated By: Ulyses Southward, M.D.     Scheduled Meds: . ciprofloxacin  500 mg Oral BID  . pantoprazole (PROTONIX) IV  40 mg Intravenous Q24H   Continuous Infusions: . sodium chloride 75 mL/hr at 01/25/13 0237    Principal Problem:   Abdominal pain Active Problems:   Nausea and vomiting   Pyelonephritis   Anemia    Time spent: > 35 MINS    [facility]  [facility] Pager [phone]. If 8PM-8AM, please contact night-coverage at [email or web address], password [facility] 01/25/2013, 3:00 PM  LOS: 4 days

## 2013-01-26 LAB — CBC
MCH: 26.4 pg (ref 26.0–34.0)
MCHC: 32.9 g/dL (ref 30.0–36.0)
MCV: 80.3 fL (ref 78.0–100.0)
Platelets: 249 10*3/uL (ref 150–400)
RBC: 4.36 MIL/uL (ref 3.87–5.11)

## 2013-01-26 LAB — BASIC METABOLIC PANEL
CO2: 22 mEq/L (ref 19–32)
Calcium: 8.8 mg/dL (ref 8.4–10.5)
Creatinine, Ser: 0.78 mg/dL (ref 0.50–1.10)
GFR calc non Af Amer: >90 mL/min (ref >90)
Glucose, Bld: 96 mg/dL (ref 70–99)

## 2013-01-26 MED ORDER — SODIUM CHLORIDE 0.9 % IV SOLN
INTRAVENOUS | Status: DC
  Administered 2013-01-27: 13:00:00 via INTRAVENOUS

## 2013-01-26 MED ORDER — SIMETHICONE 80 MG PO CHEW
160.0000 mg | CHEWABLE_TABLET | Freq: Four times a day (QID) | ORAL | Status: DC
  Administered 2013-01-26 – 2013-01-28 (×6): 160 mg via ORAL
  Filled 2013-01-26 (×10): qty 2

## 2013-01-26 NOTE — Progress Notes (Signed)
[facility] PROGRESS NOTE  Julia [name] AOZ:[id number] DOB: February 13, 1986 DOA: 01/21/2013 PCP: No primary provider on file.  Assessment/Plan:  #1 abdominal pain Questionable etiology. Patient does have epigastric abdominal pain which is tender to palpation. Patient also states with oral intake sometimes feels solids may get stuck with some associated abdominal pain as well. Patient would emesis last night and this morning after eating breakfast with abdominal pain. Patient's right CVA tenderness has improved significantly. Patient with emesis on a regular diet this morning. Lipase levels within normal limits. Continue IV fluids, pain management. Continue supportive care. HIDA scan was done yesterday which was negative. Patient's diet has been advanced to a regular diet. GI following and appreciate input and recommendations.   #2 probable pyelonephritis  clinical improvement. Urine cultures with no growth. Continue oral Cipro for 2 more days to complete a seven-day course of antibiotics.   #3 anemia Patient is on her menstrual cycle. Hemoglobin is stable. Follow H&H.   #4 nausea and vomiting Likely secondary to problem #1 and 2.  #5 prophylaxis PPI for GI prophylaxis. SCDs for DVT prophylaxis.  Code Status: Full Family Communication: updated patient at bedside. Disposition Plan: Home when medically stable   Consultants:  GI : Dr. Madilyn Fireman 01/24/2013  Procedures:  CT abdomen and pelvis 01/22/2013  Abdominal ultrasound 01/21/2013   HIDA scan  2/14/ 2014  Antibiotics:  IV Rocephin 01/22/13----> 01/24/13  Oral Cipro 01/24/2013  HPI/Subjective: Patient c/o epigastric pain. Patient stated when she ate she felt nauseous and had episode of emesis last night. Patient states had emesis this morning after breakfast with abdominal pain. Patient stated yesterday that she felt as if food may have been stuck on its way down with some associated abdominal pain. Patient states  right CVA pain has improved.  Objective: Filed Vitals:   01/25/13 0540 01/25/13 1400 01/25/13 2205 01/26/13 0605  BP: 107/60 115/78 108/70 122/81  Pulse: 70 77 84 80  Temp: 98.2 F (36.8 C) 98.1 F (36.7 C) 98.1 F (36.7 C) 98.5 F (36.9 C)  TempSrc: Oral Oral Oral Oral  Resp: 16 12 16 16   Height:      Weight:      SpO2: 100% 100% 100% 100%    Intake/Output Summary (Last 24 hours) at 01/26/13 1122 Last data filed at 01/26/13 0801  Gross per 24 hour  Intake   2480 ml  Output   2400 ml  Net     80 ml   Filed Weights   01/22/13 0414  Weight: 62.3 kg (137 lb 5.6 oz)    Exam:   General:  NAD  Cardiovascular: RRR. No LE edema  Respiratory: CTAB.  Abdomen: Soft/ND/TTP in epigastrium/+BS  Data Reviewed: Basic Metabolic Panel:  Recent Labs Lab 01/21/13 2042 01/22/13 0520 01/24/13 0445 01/25/13 0507 01/26/13 0435  NA 136 134* 138 138 137  K 4.2 3.9 3.7 3.5 3.9  CL 101 104 105 105 106  CO2 25 22 26 23 22   GLUCOSE 87 107* 86 87 96  BUN 12 7 5* 6 11  CREATININE 0.84 0.66 0.89 0.79 0.78  CALCIUM 9.5 7.5* 8.7 8.8 8.8   Liver Function Tests:  Recent Labs Lab 01/21/13 2042 01/24/13 0445  AST 19 18  ALT 17 13  ALKPHOS 94 74  BILITOT 0.2* 0.2*  PROT 8.2 6.1  ALBUMIN 4.3 3.3*    Recent Labs Lab 01/21/13 2042 01/23/13 1431  LIPASE 52 44   No results found for this basename: AMMONIA,  in the  last 168 hours CBC:  Recent Labs Lab 01/21/13 2042 01/22/13 0520 01/24/13 0445 01/25/13 0507 01/26/13 0435  WBC 7.4 8.2 6.0 5.8 5.8  NEUTROABS 3.8  --   --   --   --   HGB 12.8 10.7* 11.3* 11.5* 11.5*  HCT 39.1 32.7* 34.7* 35.3* 35.0*  MCV 80.8 81.1 81.5 80.4 80.3  PLT 301 236 216 244 249   Cardiac Enzymes: No results found for this basename: CKTOTAL, CKMB, CKMBINDEX, TROPONINI,  in the last 168 hours BNP (last 3 results) No results found for this basename: PROBNP,  in the last 8760 hours CBG: No results found for this basename: GLUCAP,  in the last  168 hours  Recent Results (from the past 240 hour(s))  URINE CULTURE     Status: None   Collection Time    01/21/13  8:57 PM      Result Value Range Status   Specimen Description URINE, CLEAN CATCH   Final   Special Requests NONE   Final   Culture  Setup Time 01/22/2013 04:06   Final   Colony Count NO GROWTH   Final   Culture NO GROWTH   Final   Report Status 01/23/2013 FINAL   Final     Studies: Nm Hepato W/eject Fract  01/25/2013  *RADIOLOGY REPORT*  Clinical Data:  Recurrent epigastric abdominal pain  NUCLEAR MEDICINE HEPATOBILIARY IMAGING WITH GALLBLADDER EF:  Technique: Sequential images of the abdomen were obtained for 60 minutes following intravenous administration of radiopharmaceutical.  Patient then ingested 8 ounces of commercially available Ensure Plus and imaging was continued for 60 minutes.  A time-activity curve was generated from tracer within the gallbladder following Ensure Plus ingestion, and the gallbladder ejection fraction was calculated.  Radiopharmaceutical:  5.5 mCi Tc-75m mebrofenin  Comparison: None Correlation:  Ultrasound abdomen 01/21/2013, CT abdomen and pelvis 01/22/2013  Findings:  Prompt tracer extraction from bloodstream, indicating normal hepatocellular function. Prompt excretion of tracer into biliary tree. Gallbladder visualized at 6 minutes. Small bowel visualized at 10 minutes. No hepatic retention of tracer.  Subjectively normal emptying of tracer from gallbladder following fatty meal stimulation. Calculated gallbladder ejection fraction is 41%, normal. Patient experienced no symptoms following fatty meal ingestion.  IMPRESSION: Patent biliary tree. Normal gallbladder response to fatty meal stimulation with a normal gallbladder ejection fraction of 41%.  Normal values for gallbladder ejection fraction: > 30% for exams utilizing sincalide (CCK) > 33% for exams utilizing fatty meal stimulation with Ensure Plus   Original Report Authenticated By: Ulyses Southward,  M.D.     Scheduled Meds: . ciprofloxacin  500 mg Oral BID  . pantoprazole (PROTONIX) IV  40 mg Intravenous Q24H   Continuous Infusions:    Principal Problem:   Abdominal pain Active Problems:   Nausea and vomiting   Pyelonephritis   Anemia    Time spent: > 35 MINS    [facility]  [facility] Pager [phone]. If 8PM-8AM, please contact night-coverage at [email or web address], password [facility] 01/26/2013, 11:22 AM  LOS: 5 days

## 2013-01-26 NOTE — Progress Notes (Signed)
[facility] Progress Note  Subjective: The patient continues to complain of abdominal discomfort in the epigastric and mid abdomen area. She says she feels bloated. Prior evaluation has been negative. She states that she had this same symptom complex a couple of years ago and was evaluated, and nothing was found, at her symptoms eventually went away.  Objective: Vital signs in last 24 hours: Temp:  [98.1 F (36.7 C)-98.5 F (36.9 C)] 98.4 F (36.9 C) (02/15 1356) Pulse Rate:  [80-84] 80 (02/15 1356) Resp:  [16] 16 (02/15 1356) BP: (108-122)/(70-81) 114/81 mmHg (02/15 1356) SpO2:  [100 %] 100 % (02/15 1356) Weight change:    PE  She is in no distress  Abdomen is soft and nontender Lab Results: Results for orders placed during the hospital encounter of 01/21/13 (from the past 24 hour(s))  CBC     Status: Abnormal   Collection Time    01/26/13  4:35 AM      Result Value Range   WBC 5.8  4.0 - 10.5 K/uL   RBC 4.36  3.87 - 5.11 MIL/uL   Hemoglobin 11.5 (*) 12.0 - 15.0 g/dL   HCT 16.1 (*) 09.6 - 04.5 %   MCV 80.3  78.0 - 100.0 fL   MCH 26.4  26.0 - 34.0 pg   MCHC 32.9  30.0 - 36.0 g/dL   RDW 40.9  81.1 - 91.4 %   Platelets 249  150 - 400 K/uL  BASIC METABOLIC PANEL     Status: None   Collection Time    01/26/13  4:35 AM      Result Value Range   Sodium 137  135 - 145 mEq/L   Potassium 3.9  3.5 - 5.1 mEq/L   Chloride 106  96 - 112 mEq/L   CO2 22  19 - 32 mEq/L   Glucose, Bld 96  70 - 99 mg/dL   BUN 11  6 - 23 mg/dL   Creatinine, Ser 7.82  0.50 - 1.10 mg/dL   Calcium 8.8  8.4 - 95.6 mg/dL   GFR calc non Af Amer >90  >90 mL/min   GFR calc Af Amer >90  >90 mL/min    Studies/Results: @RISRSLT24 @    Assessment: Abdominal pain, etiology unclear  Plan: Continue supportive care.    Graylin Shiver 01/26/2013, 2:37 PM

## 2013-01-27 DIAGNOSIS — N12 Tubulo-interstitial nephritis, not specified as acute or chronic: Principal | ICD-10-CM

## 2013-01-27 DIAGNOSIS — K5900 Constipation, unspecified: Secondary | ICD-10-CM | POA: Diagnosis present

## 2013-01-27 DIAGNOSIS — D649 Anemia, unspecified: Secondary | ICD-10-CM

## 2013-01-27 MED ORDER — FLEET ENEMA 7-19 GM/118ML RE ENEM
1.0000 | ENEMA | Freq: Every day | RECTAL | Status: DC | PRN
  Administered 2013-01-27: 1 via RECTAL
  Filled 2013-01-27: qty 1

## 2013-01-27 MED ORDER — SORBITOL 70 % SOLN
30.0000 mL | Freq: Once | Status: AC
  Administered 2013-01-27: 30 mL via ORAL
  Filled 2013-01-27: qty 30

## 2013-01-27 MED ORDER — PANTOPRAZOLE SODIUM 40 MG PO TBEC
40.0000 mg | DELAYED_RELEASE_TABLET | Freq: Every day | ORAL | Status: DC
  Administered 2013-01-27: 40 mg via ORAL
  Filled 2013-01-27 (×2): qty 1

## 2013-01-27 MED ORDER — POLYETHYLENE GLYCOL 3350 17 G PO PACK
17.0000 g | PACK | Freq: Two times a day (BID) | ORAL | Status: DC
  Administered 2013-01-27 – 2013-01-28 (×3): 17 g via ORAL
  Filled 2013-01-27 (×4): qty 1

## 2013-01-27 MED ORDER — SENNOSIDES-DOCUSATE SODIUM 8.6-50 MG PO TABS
1.0000 | ORAL_TABLET | Freq: Every day | ORAL | Status: DC
  Administered 2013-01-27: 1 via ORAL
  Filled 2013-01-27 (×2): qty 1

## 2013-01-27 NOTE — Progress Notes (Signed)
Pt had large BM after fleets enema.  Pt states her nausea and pain continue without change.  Will continue to monitor.

## 2013-01-27 NOTE — Progress Notes (Signed)
[facility] PROGRESS NOTE  Julia [name] ZOX:[id number] DOB: 17-Sep-1986 DOA: 01/21/2013 PCP: No primary provider on file.  Assessment/Plan:  #1 abdominal pain Questionable etiology. Patient does have epigastric abdominal pain which is tender to palpation. Patient also states with oral intake sometimes feels solids may get stuck with some associated abdominal pain as well. Patient would emesis last night and this morning after eating breakfast with abdominal pain. Patient's right CVA tenderness has improved significantly. Patient with emesis on a regular diet this morning. Lipase levels within normal limits. Continue IV fluids, pain management. Continue supportive care. HIDA scan was done yesterday which was negative. Patient's diet has been advanced to a regular diet. GI following and appreciate input and recommendations.   #2 probable pyelonephritis  clinical improvement. Urine cultures with no growth. Continue oral Cipro for 1 more days to complete a seven-day course of antibiotics.   #3 anemia Patient is on her menstrual cycle. Hemoglobin is stable. Follow H&H.   #4 nausea and vomiting Likely secondary to problem #1 and 2.  #5 constipation Will place on MiraLAX twice daily. As well as Senokot.  Enema as needed.  #6 prophylaxis PPI for GI prophylaxis. SCDs for DVT prophylaxis.  Code Status: Full Family Communication: updated patient at bedside. Disposition Plan: Home when medically stable   Consultants:  GI : Dr. Madilyn Fireman 01/24/2013  Procedures:  CT abdomen and pelvis 01/22/2013  Abdominal ultrasound 01/21/2013   HIDA scan  2/14/ 2014  Antibiotics:  IV Rocephin 01/22/13----> 01/24/13  Oral Cipro 01/24/2013  HPI/Subjective: Patient c/o epigastric pain. Patient stated that had abdominal pain that was worse last night. Patient states right CVA pain has improved. Patient tolerating oral intake. Patient states hasn't had a bowel movement in about 7  days.  Objective: Filed Vitals:   01/26/13 0605 01/26/13 1356 01/26/13 2100 01/27/13 0500  BP: 122/81 114/81 110/75 100/68  Pulse: 80 80 86 77  Temp: 98.5 F (36.9 C) 98.4 F (36.9 C) 98 F (36.7 C) 98.3 F (36.8 C)  TempSrc: Oral Oral Oral Oral  Resp: 16 16 16 16   Height:      Weight:      SpO2: 100% 100% 100% 100%    Intake/Output Summary (Last 24 hours) at 01/27/13 1242 Last data filed at 01/27/13 0556  Gross per 24 hour  Intake    600 ml  Output   1000 ml  Net   -400 ml   Filed Weights   01/22/13 0414  Weight: 62.3 kg (137 lb 5.6 oz)    Exam:   General:  NAD  Cardiovascular: RRR. No LE edema  Respiratory: CTAB.  Abdomen: Soft/ND/TTP in epigastrium/+BS  Data Reviewed: Basic Metabolic Panel:  Recent Labs Lab 01/21/13 2042 01/22/13 0520 01/24/13 0445 01/25/13 0507 01/26/13 0435  NA 136 134* 138 138 137  K 4.2 3.9 3.7 3.5 3.9  CL 101 104 105 105 106  CO2 25 22 26 23 22   GLUCOSE 87 107* 86 87 96  BUN 12 7 5* 6 11  CREATININE 0.84 0.66 0.89 0.79 0.78  CALCIUM 9.5 7.5* 8.7 8.8 8.8   Liver Function Tests:  Recent Labs Lab 01/21/13 2042 01/24/13 0445  AST 19 18  ALT 17 13  ALKPHOS 94 74  BILITOT 0.2* 0.2*  PROT 8.2 6.1  ALBUMIN 4.3 3.3*    Recent Labs Lab 01/21/13 2042 01/23/13 1431  LIPASE 52 44   No results found for this basename: AMMONIA,  in the last 168 hours CBC:  Recent  Labs Lab 01/21/13 2042 01/22/13 0520 01/24/13 0445 01/25/13 0507 01/26/13 0435  WBC 7.4 8.2 6.0 5.8 5.8  NEUTROABS 3.8  --   --   --   --   HGB 12.8 10.7* 11.3* 11.5* 11.5*  HCT 39.1 32.7* 34.7* 35.3* 35.0*  MCV 80.8 81.1 81.5 80.4 80.3  PLT 301 236 216 244 249   Cardiac Enzymes: No results found for this basename: CKTOTAL, CKMB, CKMBINDEX, TROPONINI,  in the last 168 hours BNP (last 3 results) No results found for this basename: PROBNP,  in the last 8760 hours CBG: No results found for this basename: GLUCAP,  in the last 168 hours  Recent  Results (from the past 240 hour(s))  URINE CULTURE     Status: None   Collection Time    01/21/13  8:57 PM      Result Value Range Status   Specimen Description URINE, CLEAN CATCH   Final   Special Requests NONE   Final   Culture  Setup Time 01/22/2013 04:06   Final   Colony Count NO GROWTH   Final   Culture NO GROWTH   Final   Report Status 01/23/2013 FINAL   Final     Studies: No results found.  Scheduled Meds: . ciprofloxacin  500 mg Oral BID  . pantoprazole (PROTONIX) IV  40 mg Intravenous Q24H  . polyethylene glycol  17 g Oral BID  . senna-docusate  1 tablet Oral QHS  . simethicone  160 mg Oral QID   Continuous Infusions: . sodium chloride      Principal Problem:   Abdominal pain Active Problems:   Nausea and vomiting   Pyelonephritis   Anemia   Unspecified constipation    Time spent: > 35 MINS    [facility]  [facility] Pager [phone]. If 8PM-8AM, please contact night-coverage at [email or web address], password [facility] 01/27/2013, 12:42 PM  LOS: 6 days

## 2013-01-27 NOTE — Progress Notes (Signed)
PHARMACIST - PHYSICIAN COMMUNICATION CONCERNING:  IV to Oral Route Change Policy  RECOMMENDATION: This patient is receiving Protonix by the intravenous route.  Based on criteria approved by the Pharmacy and Therapeutics Committee,Protonix is being converted to the equivalent oral dose form(s).  DESCRIPTION: These criteria include:  Has a documented ability to take oral medications (tolerating diet of full liquids or better or  Gastric tube feedings for >24 hours OR taking other scheduled oral medications for >24 hours).  Expected plan for continued treatment for at least 1 day.  If you have questions about this conversion, please contact the Pharmacy Department  []   [phone] )  [facility] []   [phone] )  [facility]  []   [phone] )  [facility] [x]   [phone] )  [facility]   Geoffry Paradise, PharmD, [place] Pager: [phone] 1:20 PM Pharmacy #: [phone]

## 2013-01-28 MED ORDER — SIMETHICONE 80 MG PO CHEW: 160.0000 mg | CHEWABLE_TABLET | Freq: Four times a day (QID) | ORAL | Status: DC | PRN

## 2013-01-28 MED ORDER — ONDANSETRON HCL 4 MG PO TABS: 4.0000 mg | ORAL_TABLET | Freq: Four times a day (QID) | ORAL | Status: DC | PRN

## 2013-01-28 MED ORDER — SENNOSIDES-DOCUSATE SODIUM 8.6-50 MG PO TABS: 1.0000 | ORAL_TABLET | Freq: Every day | ORAL | Status: DC

## 2013-01-28 MED ORDER — PANTOPRAZOLE SODIUM 40 MG PO TBEC: 40.0000 mg | DELAYED_RELEASE_TABLET | Freq: Every day | ORAL | Status: DC

## 2013-01-28 MED ORDER — OXYCODONE HCL 5 MG PO TABS: 5.0000 mg | ORAL_TABLET | ORAL | Status: DC | PRN

## 2013-01-28 NOTE — Progress Notes (Signed)
Spoke to Dr. Janee Morn, informed him patient had dilaudid for continued to abd pain this am, but reports BM yesterday. Md states will send home with po pain and nausea medication, follow up with GI outpatient

## 2013-01-28 NOTE — Progress Notes (Signed)
Patient states understanding of discharge instructions, Rx for oxycodone, zofran, and protonix given

## 2013-01-28 NOTE — Discharge Summary (Signed)
Physician Discharge Summary  Julia [name] ZOX:[id number] DOB: 02/10/1986 DOA: 01/21/2013  PCP: Jackie Plum, MD  Admit date: 01/21/2013 Discharge date: 01/28/2013  Time spent: 70 minutes  Recommendations for Outpatient Follow-up:  1. Patient is to followup with PCP one week post discharge. On followup CBC and a basic metabolic profile need to be obtained. 2. Patient is to followup with Dr. Dulce Sellar,  gastroenterology 2 weeks post discharge.  Discharge Diagnoses:  Principal Problem:   Abdominal pain Active Problems:   Nausea and vomiting   Pyelonephritis   Anemia   Unspecified constipation   Discharge Condition: Stable and improved  Diet recommendation: Regular/bland  Filed Weights   01/22/13 0414  Weight: 62.3 kg (137 lb 5.6 oz)    History of present illness:  27 year-old female with no significant past medical history presented to the year because of persistent nausea vomiting with abdominal pain. Patient's symptoms started 2-3 days ago with epigastric pain radiating to the back. Over the last 24 hours patient has had multiple episodes of nausea and vomiting. Patient's symptoms worsens with food. In the ER sonogram of the abdomen was done which showed mildly dilated CBD and CT abdomen and pelvis was done which was unremarkable. Since patient has had multiple episodes of nausea and vomiting and still has abdominal pain patient has been a minute for further management.    Hospital Course:  #1 abdominal pain  Patient was admitted with abdominal pain both epigastric as well as a CVA tenderness. Urinalysis which was done was concerning for UTI and a such patient was placed empirically on IV Rocephin and treated for probable pyelonephritis. Patient CVA tenderness improved and urine cultures came back negative. Patient was also placed on IV fluids as well. Patient continued to have epigastric abdominal pain. Lipase levels were obtained which were within normal limits. CT of  the abdomen and pelvis was also done which was negative. Abdominal ultrasound which was done was also negative. Patient was initially placed on clear liquids. Due to patient's ongoing epigastric abdominal pain a GI consultation was obtained. Patient was seen in consultation by Dr. Madilyn Fireman who initially had recommended a CCK stimulated HIDA scan. Patient was also maintained on a PPI. HIDA scan which was done came back negative. Patient's diet was subsequently slowly advanced to a regular diet which she tolerated. Patient however continued to have some mild epigastric abdominal pain with some occasional nausea but no emesis. It was felt by gastroenterology that patient had an extensive workup over the previous years as well and no further inpatient workup was needed at this time. Patient will be discharged home in stable condition on a regular diet. Patient is to followup with a gastroenterologist Dr. Dulce Sellar 2 weeks post discharge. Patient is also to follow up with PCP as outpatient.  #2 probable pyelonephritis  On admission due to patient's right-sided CVA tenderness as well as urinalysis which was worse over UTI there was concern that patient was present with a probable pyelonephritis. Urine cultures which were done had no growth. Patient was initially placed on IV Rocephin. Patient was subsequently transitioned to oral ciprofloxacin and had a seven-day course of antibiotic therapy during the hospitalization. Patient remained afebrile with a normal white count. Patient has been treated adequately and will be discharged home in stable condition.  #3 anemia  During the hospitalization patient was noted to be anemic. Patient however stated that she just started her menstrual cycle. Patient's hemoglobin remained stable throughout the hospitalization. Patient will followup with PCP as  outpatient.  #4 nausea and vomiting  Likely secondary to problem #1 and 2.  #5 constipation  During the hospitalization patient was  noted to have not had a bowel movement in several days. Patient was placed on MiraLAX twice daily as well as Senokot. Patient was also placed on sorbitol. Patient had a large bowel movement and her constipation had resolved by day of discharge. Patient will be discharged home on a stool softener.   The rest of patient's chronic medical issues remained stable throughout the hospitalization.   Procedures: CT abdomen and pelvis 01/22/2013  Abdominal ultrasound 01/21/2013  HIDA scan 2/14/ 2014     Consultations: GI : Dr. Madilyn Fireman 01/24/2013     Discharge Exam: Filed Vitals:   01/27/13 0500 01/27/13 1404 01/27/13 2206 01/28/13 0525  BP: 100/68 114/80 118/86 114/74  Pulse: 77 87 101 100  Temp: 98.3 F (36.8 C) 98 F (36.7 C) 97.8 F (36.6 C) 98.7 F (37.1 C)  TempSrc: Oral Oral Oral Oral  Resp: 16 16 18 20   Height:      Weight:      SpO2: 100% 100% 98% 100%    General: NAD Cardiovascular: RRR Respiratory: CTAB Abdomen: Soft/mild TTP in epigastrium/ND/+BS  Discharge Instructions  Discharge Orders   Future Orders Complete By Expires     Diet general  As directed     Comments:      bland    Discharge instructions  As directed     Comments:      Follow up with OSEI-BONSU,GEORGE, MD in 1 week. Follow up with Dr Dulce Sellar, GI in 2 weeks    Increase activity slowly  As directed         Medication List    TAKE these medications       acetaminophen 500 MG tablet  Commonly known as:  TYLENOL  Take 1,000 mg by mouth every 6 (six) hours as needed. For pain.     ondansetron 4 MG tablet  Commonly known as:  ZOFRAN  Take 1 tablet (4 mg total) by mouth every 6 (six) hours as needed for nausea.     orlistat 60 MG capsule  Commonly known as:  ALLI  Take 60 mg by mouth 3 (three) times daily with meals.     oxyCODONE 5 MG immediate release tablet  Commonly known as:  ROXICODONE  Take 1 tablet (5 mg total) by mouth every 4 (four) hours as needed for pain.     pantoprazole  40 MG tablet  Commonly known as:  PROTONIX  Take 1 tablet (40 mg total) by mouth at bedtime.     senna-docusate 8.6-50 MG per tablet  Commonly known as:  Senokot-S  Take 1 tablet by mouth at bedtime.     simethicone 80 MG chewable tablet  Commonly known as:  MYLICON  Chew 2 tablets (160 mg total) by mouth every 6 (six) hours as needed for flatulence.           Follow-up Information   Follow up with OSEI-BONSU,GEORGE, MD. Schedule an appointment as soon as possible for a visit in 1 week.   Contact information:   [address], [address] [place] [address] [phone]       Follow up with Freddy Jaksch, MD. Schedule an appointment as soon as possible for a visit in 2 weeks.   Contact information:   [address] [place] [address] [phone]        The  results of significant diagnostics from this hospitalization (including imaging, microbiology, ancillary and laboratory) are listed below for reference.    Significant Diagnostic Studies: US Abdomen Complete  01/22/2013  *RADIOLOGY REPORT*  Clinical Data:  Right upper quadrant abdominal pain  COMPLETE ABDOMINAL ULTRASOUND  Comparison:  11/30/2010  Findings:  Gallbladder:  No gallstones, gallbladder wall thickening, or pericholecystic fluid.  Common bile duct:  Measures 5 mm in diameter by my measurement, in the upper normal range.  No directly visualized choledocholithiasis.  No intrahepatic biliary dilatation.  Liver:  No focal lesion identified.  Within normal limits in parenchymal echogenicity.  IVC:  Appears normal.  Pancreas:  Visualized pancreatic head and body appear unremarkable. Nonvisualization of pancreatic tail due to overlying bowel gas.  Spleen:  Measures 8.6 cm craniocaudad and appears normal.  Right Kidney:  Measures 9.7 cm in length by my measurement and appears normal.  Left Kidney:  Measures 10.0 cm in length and appears normal.  Abdominal aorta:  Abdominal aorta unremarkable;  bifurcation not visualized due to overlying bowel gas.  IMPRESSION:  1.  The aortic bifurcation and pancreatic tail are not well seen due to overlying bowel gas. 2.  Borderline prominence CBD at 5 millimeters, but without directly visualized choledocholithiasis.  No intrahepatic biliary dilatation.   Original Report Authenticated By: Gaylyn Rong, M.D.    Ct Abdomen Pelvis W Contrast  01/22/2013  *RADIOLOGY REPORT*  Clinical Data: Abdominal pain radiating to the back.  Nausea.  CT ABDOMEN AND PELVIS WITH CONTRAST  Technique:  Multidetector CT imaging of the abdomen and pelvis was performed following the standard protocol during bolus administration of intravenous contrast.  Contrast: OMNIPAQUE IOHEXOL 300 MG/ML  SOLN  Comparison: Ultrasound dated 01/21/2013  Findings: No overt biliary dilatation observed.  Liver unremarkable.  Hypodense splenic lesion, 1.0 x 0.9 cm on image 9 of series 2, nonspecific but likely benign.  The adrenal glands and pancreas appear normal.  Two small calculi in the left kidney lower pole each measure about 2 mm in diameter. These were not readily apparent on the recent ultrasound.  No hydronephrosis or ureteral calculus.  Urinary bladder unremarkable. No right-sided renal calculi.  The appendix appears normal. No pathologic retroperitoneal or porta hepatis adenopathy is identified.  No pathologic pelvic adenopathy is identified.  Uterus and adnexa unremarkable.  IMPRESSION:  1.  There are to 2 mm nonobstructive left kidney lower pole calculi. 2.  Small hypodense lesion in the upper portion of the spleen, potentially a small cyst or hemangioma. 3.  A specific cause for the patient's upper abdominal pain is not identified.   Original Report Authenticated By: Gaylyn Rong, M.D.    Nm Hepato W/eject Fract  01/25/2013  *RADIOLOGY REPORT*  Clinical Data:  Recurrent epigastric abdominal pain  NUCLEAR MEDICINE HEPATOBILIARY IMAGING WITH GALLBLADDER EF:  Technique: Sequential  images of the abdomen were obtained for 60 minutes following intravenous administration of radiopharmaceutical.  Patient then ingested 8 ounces of commercially available Ensure Plus and imaging was continued for 60 minutes.  A time-activity curve was generated from tracer within the gallbladder following Ensure Plus ingestion, and the gallbladder ejection fraction was calculated.  Radiopharmaceutical:  5.5 mCi Tc-47m mebrofenin  Comparison: None Correlation:  Ultrasound abdomen 01/21/2013, CT abdomen and pelvis 01/22/2013  Findings:  Prompt tracer extraction from bloodstream, indicating normal hepatocellular function. Prompt excretion of tracer into biliary tree. Gallbladder visualized at 6 minutes. Small bowel visualized at 10 minutes. No hepatic retention of tracer.  Subjectively normal emptying  of tracer from gallbladder following fatty meal stimulation. Calculated gallbladder ejection fraction is 41%, normal. Patient experienced no symptoms following fatty meal ingestion.  IMPRESSION: Patent biliary tree. Normal gallbladder response to fatty meal stimulation with a normal gallbladder ejection fraction of 41%.  Normal values for gallbladder ejection fraction: > 30% for exams utilizing sincalide (CCK) > 33% for exams utilizing fatty meal stimulation with Ensure Plus   Original Report Authenticated By: Ulyses Southward, M.D.     Microbiology: Recent Results (from the past 240 hour(s))  URINE CULTURE     Status: None   Collection Time    01/21/13  8:57 PM      Result Value Range Status   Specimen Description URINE, CLEAN CATCH   Final   Special Requests NONE   Final   Culture  Setup Time 01/22/2013 04:06   Final   Colony Count NO GROWTH   Final   Culture NO GROWTH   Final   Report Status 01/23/2013 FINAL   Final     Labs: Basic Metabolic Panel:  Recent Labs Lab 01/21/13 2042 01/22/13 0520 01/24/13 0445 01/25/13 0507 01/26/13 0435  NA 136 134* 138 138 137  K 4.2 3.9 3.7 3.5 3.9  CL 101 104 105  105 106  CO2 25 22 26 23 22   GLUCOSE 87 107* 86 87 96  BUN 12 7 5* 6 11  CREATININE 0.84 0.66 0.89 0.79 0.78  CALCIUM 9.5 7.5* 8.7 8.8 8.8   Liver Function Tests:  Recent Labs Lab 01/21/13 2042 01/24/13 0445  AST 19 18  ALT 17 13  ALKPHOS 94 74  BILITOT 0.2* 0.2*  PROT 8.2 6.1  ALBUMIN 4.3 3.3*    Recent Labs Lab 01/21/13 2042 01/23/13 1431  LIPASE 52 44   No results found for this basename: AMMONIA,  in the last 168 hours CBC:  Recent Labs Lab 01/21/13 2042 01/22/13 0520 01/24/13 0445 01/25/13 0507 01/26/13 0435  WBC 7.4 8.2 6.0 5.8 5.8  NEUTROABS 3.8  --   --   --   --   HGB 12.8 10.7* 11.3* 11.5* 11.5*  HCT 39.1 32.7* 34.7* 35.3* 35.0*  MCV 80.8 81.1 81.5 80.4 80.3  PLT 301 236 216 244 249   Cardiac Enzymes: No results found for this basename: CKTOTAL, CKMB, CKMBINDEX, TROPONINI,  in the last 168 hours BNP: BNP (last 3 results) No results found for this basename: PROBNP,  in the last 8760 hours CBG: No results found for this basename: GLUCAP,  in the last 168 hours     Signed:  Kollen Armenti  [facility] 01/28/2013, 11:32 AM

## 2013-01-30 ENCOUNTER — Encounter (HOSPITAL_COMMUNITY): Payer: Self-pay | Admitting: Emergency Medicine

## 2013-01-30 ENCOUNTER — Emergency Department (HOSPITAL_COMMUNITY)
Admission: EM | Admit: 2013-01-30 | Discharge: 2013-01-31 | Disposition: A | Discharge status: Home / Self Care | Payer: Medicaid Other | Attending: Emergency Medicine | Admitting: Emergency Medicine

## 2013-01-30 DIAGNOSIS — F329 Major depressive disorder, single episode, unspecified: Secondary | ICD-10-CM | POA: Insufficient documentation

## 2013-01-30 DIAGNOSIS — F3289 Other specified depressive episodes: Secondary | ICD-10-CM | POA: Insufficient documentation

## 2013-01-30 DIAGNOSIS — R630 Anorexia: Secondary | ICD-10-CM | POA: Insufficient documentation

## 2013-01-30 DIAGNOSIS — Z8619 Personal history of other infectious and parasitic diseases: Secondary | ICD-10-CM | POA: Insufficient documentation

## 2013-01-30 DIAGNOSIS — Z79899 Other long term (current) drug therapy: Secondary | ICD-10-CM | POA: Insufficient documentation

## 2013-01-30 DIAGNOSIS — R5381 Other malaise: Secondary | ICD-10-CM | POA: Insufficient documentation

## 2013-01-30 DIAGNOSIS — R61 Generalized hyperhidrosis: Secondary | ICD-10-CM | POA: Insufficient documentation

## 2013-01-30 DIAGNOSIS — N390 Urinary tract infection, site not specified: Secondary | ICD-10-CM | POA: Insufficient documentation

## 2013-01-30 DIAGNOSIS — R6883 Chills (without fever): Secondary | ICD-10-CM | POA: Insufficient documentation

## 2013-01-30 NOTE — ED Notes (Signed)
Pt states she was a pt in the hospital from Feb 10 to the 17th  For urinary issues  Pt states since she has been home she has been having problems with anxiety, loss of appetite, chills, abd pain, nausea, and feeling very cold but sweating at the same time  Pt states she is having feelings of desperation

## 2013-01-31 LAB — CBC WITH DIFFERENTIAL/PLATELET
Basophils Absolute: 0 10*3/uL (ref 0.0–0.1)
Basophils Relative: 0 % (ref 0–1)
Eosinophils Absolute: 0.1 10*3/uL (ref 0.0–0.7)
Eosinophils Relative: 1 % (ref 0–5)
MCH: 26.6 pg (ref 26.0–34.0)
MCV: 79 fL (ref 78.0–100.0)
Platelets: 312 10*3/uL (ref 150–400)
RDW: 13.4 % (ref 11.5–15.5)

## 2013-01-31 LAB — COMPREHENSIVE METABOLIC PANEL
ALT: 32 U/L (ref 0–35)
AST: 30 U/L (ref 0–37)
Albumin: 4.9 g/dL (ref 3.5–5.2)
Calcium: 10.1 mg/dL (ref 8.4–10.5)
GFR calc Af Amer: >90 mL/min (ref >90)
Sodium: 139 mEq/L (ref 135–145)
Total Protein: 8.7 g/dL — ABNORMAL HIGH (ref 6.0–8.3)

## 2013-01-31 LAB — URINE MICROSCOPIC-ADD ON

## 2013-01-31 LAB — URINALYSIS, ROUTINE W REFLEX MICROSCOPIC
Glucose, UA: NEGATIVE mg/dL
Hgb urine dipstick: NEGATIVE
Specific Gravity, Urine: 1.027 (ref 1.005–1.030)
Urobilinogen, UA: 1 mg/dL (ref 0.0–1.0)
pH: 6 (ref 5.0–8.0)

## 2013-01-31 MED ORDER — NITROFURANTOIN MONOHYD MACRO 100 MG PO CAPS: 100.0000 mg | ORAL_CAPSULE | Freq: Two times a day (BID) | ORAL | Status: DC

## 2013-01-31 MED ORDER — LORAZEPAM 1 MG PO TABS
1.0000 mg | ORAL_TABLET | Freq: Once | ORAL | Status: AC
  Administered 2013-01-31: 1 mg via ORAL
  Filled 2013-01-31: qty 1

## 2013-01-31 NOTE — ED Provider Notes (Signed)
Medical screening examination/treatment/procedure(s) were performed by non-physician practitioner and as supervising physician I was immediately available for consultation/collaboration.  Sunnie Nielsen, MD 01/31/13 [phone]

## 2013-01-31 NOTE — ED Provider Notes (Signed)
History     CSN: [id number]  Arrival date & time 01/30/13  2217   First MD Initiated Contact with Patient 01/31/13 0053      Chief Complaint  Patient presents with  . Anxiety   HPI  History provided by the patient. Patient is a 27 year old female with recent hospitalization for abdominal pain and urinary tract infection presents with complaints of anxiety, depression, decreased appetite, chills and night sweats. Patient states that symptoms have continued to gradually worsen since being discharged from the hospital several days ago. She reports that she has upcoming followup with Dr. Dulce Sellar for continued evaluation of her abdominal discomforts and pains. She also states she has had some issues with constipation. Patient states that she feels that her "wits end". She denies any SI or HI to me. Symptoms do seem slightly improved after taking oxycodone. Denies any other aggravating or alleviating factors. Denies any other associated symptoms.    Past Medical History  Diagnosis Date  . Chlamydia 2007  . Infection   . Anxiety     Past Surgical History  Procedure Laterality Date  . Dilation and curettage of uterus    . Colonoscopy  2012  . Upper gastrointestinal endoscopy  2012    Family History  Problem Relation Age of Onset  . Anesthesia problems Neg Hx   . Diabetes type II Mother   . Hypertension Mother     History  Substance Use Topics  . Smoking status: Never Smoker   . Smokeless tobacco: Never Used  . Alcohol Use: No    OB History   Grav Para Term Preterm Abortions TAB SAB Ect Mult Living   3 2   1  1   2       Review of Systems  Constitutional: Positive for diaphoresis, appetite change and fatigue. Negative for fever and unexpected weight change.  Psychiatric/Behavioral: Positive for dysphoric mood. Negative for suicidal ideas. The patient is nervous/anxious.   All other systems reviewed and are negative.    Allergies  Review of patient's allergies  indicates no known allergies.  Home Medications   Current Outpatient Rx  Name  Route  Sig  Dispense  Refill  . acetaminophen (TYLENOL) 500 MG tablet   Oral   Take 1,000 mg by mouth every 6 (six) hours as needed. For pain.         . clonazePAM (KLONOPIN) 0.5 MG tablet   Oral   Take 0.5 mg by mouth at bedtime as needed for anxiety.         . ondansetron (ZOFRAN) 4 MG tablet   Oral   Take 1 tablet (4 mg total) by mouth every 6 (six) hours as needed for nausea.   20 tablet   0   . oxyCODONE (ROXICODONE) 5 MG immediate release tablet   Oral   Take 1 tablet (5 mg total) by mouth every 4 (four) hours as needed for pain.   20 tablet   0   . pantoprazole (PROTONIX) 40 MG tablet   Oral   Take 1 tablet (40 mg total) by mouth at bedtime.   31 tablet   0   . promethazine (PHENERGAN) 12.5 MG tablet   Oral   Take 12.5 mg by mouth every 6 (six) hours as needed for nausea.         [name] senna-docusate (SENOKOT-S) 8.6-50 MG per tablet   Oral   Take 1 tablet by mouth at bedtime.         [name]  simethicone (MYLICON) 80 MG chewable tablet   Oral   Chew 2 tablets (160 mg total) by mouth every 6 (six) hours as needed for flatulence.   30 tablet        BP 110/72  Pulse 100  Temp(Src) 98 F (36.7 C)  Resp 16  SpO2 100%  LMP 01/18/2013  Physical Exam  Nursing note and vitals reviewed. Constitutional: She is oriented to person, place, and time. She appears well-developed and well-nourished. No distress.  HENT:  Head: Normocephalic and atraumatic.  Eyes: Conjunctivae and EOM are normal. Pupils are equal, round, and reactive to light.  Neck: Normal range of motion. Neck supple. No thyromegaly present.  No meningeal sign  Cardiovascular: Normal rate and regular rhythm.   Pulmonary/Chest: Effort normal and breath sounds normal.  Abdominal: Soft. She exhibits no distension. There is no tenderness. There is no rebound and no guarding.  No CVA tenderness  Musculoskeletal: Normal  range of motion.  Neurological: She is alert and oriented to person, place, and time.  Skin: Skin is warm and dry. No rash noted.  Psychiatric: She has a normal mood and affect. Her behavior is normal.    ED Course  Procedures   Results for orders placed during the hospital encounter of 01/30/13  CBC WITH DIFFERENTIAL      Result Value Range   WBC 9.5  4.0 - 10.5 K/uL   RBC 5.18 (*) 3.87 - 5.11 MIL/uL   Hemoglobin 13.8  12.0 - 15.0 g/dL   HCT 40.9  81.1 - 91.4 %   MCV 79.0  78.0 - 100.0 fL   MCH 26.6  26.0 - 34.0 pg   MCHC 33.7  30.0 - 36.0 g/dL   RDW 78.2  95.6 - 21.3 %   Platelets 312  150 - 400 K/uL   Neutrophils Relative 48  43 - 77 %   Neutro Abs 4.6  1.7 - 7.7 K/uL   Lymphocytes Relative 45  12 - 46 %   Lymphs Abs 4.3 (*) 0.7 - 4.0 K/uL   Monocytes Relative 6  3 - 12 %   Monocytes Absolute 0.6  0.1 - 1.0 K/uL   Eosinophils Relative 1  0 - 5 %   Eosinophils Absolute 0.1  0.0 - 0.7 K/uL   Basophils Relative 0  0 - 1 %   Basophils Absolute 0.0  0.0 - 0.1 K/uL  COMPREHENSIVE METABOLIC PANEL      Result Value Range   Sodium 139  135 - 145 mEq/L   Potassium 3.8  3.5 - 5.1 mEq/L   Chloride 102  96 - 112 mEq/L   CO2 25  19 - 32 mEq/L   Glucose, Bld 91  70 - 99 mg/dL   BUN 11  6 - 23 mg/dL   Creatinine, Ser 0.86  0.50 - 1.10 mg/dL   Calcium 57.8  8.4 - 46.9 mg/dL   Total Protein 8.7 (*) 6.0 - 8.3 g/dL   Albumin 4.9  3.5 - 5.2 g/dL   AST 30  0 - 37 U/L   ALT 32  0 - 35 U/L   Alkaline Phosphatase 113  39 - 117 U/L   Total Bilirubin 0.4  0.3 - 1.2 mg/dL   GFR calc non Af Amer 82 (*) >90 mL/min   GFR calc Af Amer >90  >90 mL/min  LIPASE, BLOOD      Result Value Range   Lipase 42  11 - 59 U/L  URINALYSIS, ROUTINE W  REFLEX MICROSCOPIC      Result Value Range   Color, Urine YELLOW  YELLOW   APPearance CLOUDY (*) CLEAR   Specific Gravity, Urine 1.027  1.005 - 1.030   pH 6.0  5.0 - 8.0   Glucose, UA NEGATIVE  NEGATIVE mg/dL   Hgb urine dipstick NEGATIVE  NEGATIVE    Bilirubin Urine NEGATIVE  NEGATIVE   Ketones, ur NEGATIVE  NEGATIVE mg/dL   Protein, ur NEGATIVE  NEGATIVE mg/dL   Urobilinogen, UA 1.0  0.0 - 1.0 mg/dL   Nitrite NEGATIVE  NEGATIVE   Leukocytes, UA MODERATE (*) NEGATIVE  URINE MICROSCOPIC-ADD ON      Result Value Range   Squamous Epithelial / LPF MANY (*) RARE   WBC, UA 7-10  <3 WBC/hpf   Bacteria, UA MANY (*) RARE        1. UTI (lower urinary tract infection)       MDM  Patient seen and evaluated. Patient appears well and calm at this time. She is no acute distress  Patient discussed with attending physician. Agrees with workup and treatment plan. Patient given outpatient referrals and resources guide.      Angus Seller, [place] 01/31/13 [phone]

## 2013-02-01 LAB — URINE CULTURE

## 2013-02-03 ENCOUNTER — Encounter: Payer: Self-pay | Admitting: Nurse Practitioner

## 2013-02-28 ENCOUNTER — Ambulatory Visit (INDEPENDENT_AMBULATORY_CARE_PROVIDER_SITE_OTHER): Payer: Medicaid Other | Admitting: Obstetrics

## 2013-02-28 ENCOUNTER — Encounter: Payer: Self-pay | Admitting: Obstetrics

## 2013-02-28 ENCOUNTER — Other Ambulatory Visit: Payer: Self-pay | Admitting: Obstetrics

## 2013-02-28 VITALS — Temp 98.3°F | Ht 60.0 in | Wt 135.0 lb

## 2013-02-28 DIAGNOSIS — R102 Pelvic and perineal pain: Secondary | ICD-10-CM

## 2013-02-28 DIAGNOSIS — N949 Unspecified condition associated with female genital organs and menstrual cycle: Secondary | ICD-10-CM

## 2013-02-28 DIAGNOSIS — Z3202 Encounter for pregnancy test, result negative: Secondary | ICD-10-CM

## 2013-02-28 DIAGNOSIS — N760 Acute vaginitis: Secondary | ICD-10-CM

## 2013-02-28 LAB — CBC WITH DIFFERENTIAL/PLATELET
Basophils Absolute: 0 10*3/uL (ref 0.0–0.1)
Eosinophils Relative: 1 % (ref 0–5)
Lymphocytes Relative: 41 % (ref 12–46)
Lymphs Abs: 2.8 10*3/uL (ref 0.7–4.0)
MCV: 78.5 fL (ref 78.0–100.0)
Neutro Abs: 3.6 10*3/uL (ref 1.7–7.7)
Platelets: 281 10*3/uL (ref 150–400)
RBC: 4.69 MIL/uL (ref 3.87–5.11)
RDW: 14.8 % (ref 11.5–15.5)
WBC: 6.9 10*3/uL (ref 4.0–10.5)

## 2013-02-28 MED ORDER — TRAMADOL HCL 50 MG PO TABS: 50.0000 mg | ORAL_TABLET | Freq: Four times a day (QID) | ORAL | Status: DC | PRN

## 2013-02-28 NOTE — Progress Notes (Signed)
Patient is here today for a problem visit.  She has an abnormal discharge for since February.  Denies itching, burning, or odor.  She is also having pain during intercourse for about one month - every time.  Last pap smear 10/31/12 - normal.

## 2013-02-28 NOTE — Progress Notes (Signed)
  Subjective:     Julia [name] is a 27 y.o. female who presents for evaluation of abdominal pain. The pain is described as cramping, and is 8/10 in intensity. Pain is located in the LLQ area with radiation to the left back. Onset was gradual occurring 1 month ago. Symptoms have been gradually worsening since. Aggravating factors: activity and intercourse. Alleviating factors: NSAIDs. Associated symptoms: none. The patient denies fever. Risk factors for pelvic/abdominal pain include none.  Menstrual History: OB History   Grav Para Term Preterm Abortions TAB SAB Ect Mult Living   3 2   1  1   2       Menarche age: [place]  Patient's last menstrual period was 02/13/2013.    The following portions of the patient's history were reviewed and updated as appropriate: allergies, current medications, past family history, past medical history, past social history, past surgical history and problem list.   Review of Systems Gastrointestinal: positive for abdominal pain Genitourinary:negative except for pain    Objective:    Temp(Src) 98.3 F (36.8 C) (Oral)  Ht 5' (1.524 m)  Wt 135 lb (61.236 kg)  BMI 26.37 kg/m2  LMP 02/13/2013 General:   alert and no distress  Lungs:   Not performed  Heart:   Not performed.  Abdomen:  soft, non-tender; bowel sounds normal; no masses,  no organomegaly  CVA:   absent  Pelvis:  External genitalia: normal general appearance Adnexa: tenderness Uterus: normal single, nontender Clinical staff offered to be present for exam: yes  Initials: JR  Extremities:   extremities normal, atraumatic, no cyanosis or edema  Neurologic:   negative  Psychiatric:   normal mood, behavior, speech, dress, and thought processes   Lab Review Labs: GC/Chlamydia DNA probe of cervico/vaginal secretions and Wet mount of vaginal secretions   Imaging Ultrasound - Pelvic Vaginal    Assessment:    Functional: ovarian cyst rupture    Plan:    The diagnosis was  discussed with the patient and evaluation and treatment plans outlined.

## 2013-03-01 LAB — COMPREHENSIVE METABOLIC PANEL
ALT: 21 U/L (ref 0–35)
AST: 21 U/L (ref 0–37)
Albumin: 4.8 g/dL (ref 3.5–5.2)
Alkaline Phosphatase: 81 U/L (ref 39–117)
BUN: 11 mg/dL (ref 6–23)
Calcium: 10.3 mg/dL (ref 8.4–10.5)
Chloride: 104 mEq/L (ref 96–112)
Potassium: 4.3 mEq/L (ref 3.5–5.3)
Sodium: 138 mEq/L (ref 135–145)
Total Protein: 7.7 g/dL (ref 6.0–8.3)

## 2013-03-01 LAB — TSH: TSH: 1.161 u[IU]/mL (ref 0.350–4.500)

## 2013-03-01 LAB — WET PREP BY MOLECULAR PROBE
Candida species: NEGATIVE
Gardnerella vaginalis: NEGATIVE

## 2013-03-01 LAB — GC/CHLAMYDIA PROBE AMP: GC Probe RNA: NEGATIVE

## 2013-03-07 ENCOUNTER — Encounter: Payer: Self-pay | Admitting: Obstetrics

## 2013-03-13 ENCOUNTER — Other Ambulatory Visit: Payer: Self-pay | Admitting: Obstetrics

## 2013-03-13 ENCOUNTER — Ambulatory Visit (INDEPENDENT_AMBULATORY_CARE_PROVIDER_SITE_OTHER): Payer: Medicaid Other

## 2013-03-13 ENCOUNTER — Encounter: Payer: Self-pay | Admitting: Obstetrics

## 2013-03-13 DIAGNOSIS — R102 Pelvic and perineal pain: Secondary | ICD-10-CM

## 2013-03-13 DIAGNOSIS — N946 Dysmenorrhea, unspecified: Secondary | ICD-10-CM

## 2013-03-13 DIAGNOSIS — IMO0002 Reserved for concepts with insufficient information to code with codable children: Secondary | ICD-10-CM

## 2013-03-14 ENCOUNTER — Encounter (INDEPENDENT_AMBULATORY_CARE_PROVIDER_SITE_OTHER): Payer: Self-pay | Admitting: Surgery

## 2013-03-15 ENCOUNTER — Telehealth: Payer: Self-pay | Admitting: *Deleted

## 2013-03-15 NOTE — Telephone Encounter (Signed)
Pt reports that Dr Clearance Coots wrote her an Rx for Tramadol and it makes her nauseous and sleepy. She would like a different rx . Patient states she was using ibuprofen which made her nauseous also. Pt states dr Clearance Coots mentioned Vicoden in their conversation and she would like to try that instead. Pt uses the [facility]/ [address]. Told patient I would forward her message and ask dr Tamela Oddi what we could do for her.

## 2013-03-18 ENCOUNTER — Ambulatory Visit: Payer: Medicaid Other | Admitting: Obstetrics

## 2013-03-18 ENCOUNTER — Encounter (INDEPENDENT_AMBULATORY_CARE_PROVIDER_SITE_OTHER): Payer: Self-pay | Admitting: Surgery

## 2013-03-18 ENCOUNTER — Ambulatory Visit (INDEPENDENT_AMBULATORY_CARE_PROVIDER_SITE_OTHER): Payer: Medicaid Other | Admitting: Surgery

## 2013-03-18 VITALS — BP 110/64 | HR 75 | Temp 97.0°F | Resp 18 | Ht 60.0 in | Wt 134.2 lb

## 2013-03-18 DIAGNOSIS — K811 Chronic cholecystitis: Secondary | ICD-10-CM | POA: Insufficient documentation

## 2013-03-18 NOTE — Telephone Encounter (Signed)
OK to call in #60 Vicodin 5/325

## 2013-03-18 NOTE — Progress Notes (Signed)
Patient ID: Julia Julia [name], female   DOB: 10/31/1986, 27 y.o.   MRN: [id number]  Chief Complaint  Patient presents with  . New Evaluation    eval abd pain    HPI Julia Julia [name] is Julia [name] 27 y.o. female.   HPI This is Julia [name] 27 year old female who presents with Julia [name] several year history of epigastric and right upper quadrant abdominal pain hurting through to the back. This occurs during fatty meals. It now happens every day.  She also has nausea and vomiting with this. She has had an extensive workup including ultrasound and HIDA which are negative.  She does have mild chronic constipation. The pain is described as sharp and severe. Past Medical History  Diagnosis Date  . Chlamydia 2007  . Infection   . Anxiety   . Kidney stones   . GERD (gastroesophageal reflux disease)   . Back pain   . Arthritis   . Depression   . Migraine   . Dyspepsia   . Sinusitis     Past Surgical History  Procedure Laterality Date  . Dilation and curettage of uterus    . Colonoscopy  2012  . Upper gastrointestinal endoscopy  2012    Family History  Problem Relation Age of Onset  . Anesthesia problems Neg Hx   . Diabetes type II Mother   . Hypertension Mother   . Diabetes Mother   . Hyperlipidemia Father   . Hypertension Father     Social History History  Substance Use Topics  . Smoking status: Never Smoker   . Smokeless tobacco: Never Used  . Alcohol Use: No    No Known Allergies  Current Outpatient Prescriptions  Medication Sig Dispense Refill  . ondansetron (ZOFRAN) 4 MG tablet Take 1 tablet (4 mg total) by mouth every 6 (six) hours as needed for nausea.  20 tablet  0  . promethazine (PHENERGAN) 12.5 MG tablet Take 12.5 mg by mouth every 6 (six) hours as needed for nausea.      [name] senna-docusate (SENOKOT-S) 8.6-50 MG per tablet Take 1 tablet by mouth at bedtime.      . sertraline (ZOLOFT) 50 MG tablet Take 50 mg by mouth daily.      . sucralfate (CARAFATE) 1 GM/10ML suspension Take  1 g by mouth 2 (two) times daily.      . traMADol (ULTRAM) 50 MG tablet Take 1 tablet (50 mg total) by mouth every 6 (six) hours as needed for pain.  40 tablet  2   No current facility-administered medications for this visit.    Review of Systems Review of Systems  Constitutional: Negative for fever, chills and unexpected weight change.  HENT: Negative for hearing loss, congestion, sore throat, trouble swallowing and voice change.   Eyes: Negative for visual disturbance.  Respiratory: Negative for cough and wheezing.   Cardiovascular: Negative for chest pain, palpitations and leg swelling.  Gastrointestinal: Positive for nausea, vomiting, abdominal pain, constipation and abdominal distention. Negative for diarrhea, blood in stool and anal bleeding.  Genitourinary: Negative for hematuria, vaginal bleeding and difficulty urinating.  Musculoskeletal: Negative for arthralgias.  Skin: Negative for rash and wound.  Neurological: Negative for seizures, syncope and headaches.  Hematological: Negative for adenopathy. Does not bruise/bleed easily.  Psychiatric/Behavioral: Negative for confusion.    Blood pressure 110/64, pulse 75, temperature 97 F (36.1 C), temperature source Temporal, resp. rate 18, height 5' (1.524 m), weight 134 lb 3.2 oz (60.873 kg), last menstrual period 02/13/2013.  Physical Exam Physical Exam  Constitutional: She is oriented to person, place, and time. She appears well-developed and well-nourished. No distress.  HENT:  Head: Normocephalic and atraumatic.  Right Ear: External ear normal.  Left Ear: External ear normal.  Nose: Nose normal.  Mouth/Throat: Oropharynx is clear and moist. No oropharyngeal exudate.  Eyes: Conjunctivae are normal. Pupils are equal, round, and reactive to light. Right eye exhibits no discharge. Left eye exhibits no discharge. No scleral icterus.  Neck: Normal range of motion. Neck supple. No tracheal deviation present. No thyromegaly present.   Cardiovascular: Normal rate, regular rhythm, normal heart sounds and intact distal pulses.   No murmur heard. Pulmonary/Chest: Effort normal and breath sounds normal. No respiratory distress. She has no wheezes. She has no rales.  Abdominal: Soft. Bowel sounds are normal. There is tenderness. There is guarding.  There is moderate right upper quadrant abdominal tenderness with guarding  Musculoskeletal: Normal range of motion. She exhibits no edema and no tenderness.  Lymphadenopathy:    She has no cervical adenopathy.  Neurological: She is alert and oriented to person, place, and time. No cranial nerve deficit. Coordination normal.  Skin: Skin is warm and dry. No rash noted. She is not diaphoretic. No erythema.  Psychiatric: Her behavior is normal. Judgment normal.    Data Reviewed I have reviewed all her x-ray and laboratory data  Assessment    Chronic cholecystitis     Plan    I do believe given her symptoms and physical examination that she has chronic cholecystitis and I recommend laparoscopic cholecystectomy. I discussed this with her in detail.  I gave her literature regarding the surgical procedure. I discussed the risks which includes but is not limited to bleeding, infection, bile duct injury, bile leak, injury to surrounding structures, the chance this may not resolve all her symptoms, need for open surgery, et Karie Soda. I also discussed postop recovery. She understands and wishes to proceed. Likelihood of success is good        Julia Julia [name] 03/18/2013, 11:29 AM

## 2013-03-19 ENCOUNTER — Encounter: Payer: Self-pay | Admitting: *Deleted

## 2013-03-20 NOTE — Telephone Encounter (Signed)
Rx called to pharmacy- patient notified.

## 2013-03-22 ENCOUNTER — Encounter (INDEPENDENT_AMBULATORY_CARE_PROVIDER_SITE_OTHER): Payer: Self-pay

## 2013-03-28 ENCOUNTER — Ambulatory Visit: Payer: Medicaid Other | Admitting: Obstetrics

## 2013-04-04 ENCOUNTER — Encounter: Payer: Self-pay | Admitting: Obstetrics

## 2013-04-08 ENCOUNTER — Encounter: Payer: Self-pay | Admitting: Obstetrics

## 2013-04-08 ENCOUNTER — Ambulatory Visit (INDEPENDENT_AMBULATORY_CARE_PROVIDER_SITE_OTHER): Payer: Medicaid Other | Admitting: Obstetrics

## 2013-04-08 ENCOUNTER — Encounter (HOSPITAL_COMMUNITY): Payer: Self-pay | Admitting: Pharmacy Technician

## 2013-04-08 ENCOUNTER — Other Ambulatory Visit: Payer: Self-pay | Admitting: Obstetrics

## 2013-04-08 VITALS — BP 108/72 | HR 94 | Temp 98.5°F | Ht 60.0 in | Wt 135.0 lb

## 2013-04-08 DIAGNOSIS — N643 Galactorrhea not associated with childbirth: Secondary | ICD-10-CM

## 2013-04-08 DIAGNOSIS — N760 Acute vaginitis: Secondary | ICD-10-CM | POA: Insufficient documentation

## 2013-04-08 DIAGNOSIS — N946 Dysmenorrhea, unspecified: Secondary | ICD-10-CM

## 2013-04-08 NOTE — Progress Notes (Signed)
Subjective:     Julia [name] is a 27 y.o. female here for a routine exam.  Current complaints: Pt in office today for a follow up appointment.Pt states she had an ultrasound performed in our office about 3 weeks ago. Pt is here today to discuss results. Pt states she has also continued to have a yellow discharge. Pt denies any odor, itching, irritation or burning. Pt states she has also noticed a discharge coming from her left breast.  Personal health questionnaire reviewed: yes.   Gynecologic History Patient's last menstrual period was 03/26/2013. Contraception: none Last Pap:09/2012  Results were: normal Last mammogram: n/a. Results were: n/a  Obstetric History OB History   Grav Para Term Preterm Abortions TAB SAB Ect Mult Living   3 2   1  1   2      # Outc Date GA Lbr Len/2nd Wgt Sex Del Anes PTL Lv   1 PAR            2 PAR            3 SAB                The following portions of the patient's history were reviewed and updated as appropriate: allergies, current medications, past family history, past medical history, past social history, past surgical history and problem list.  Review of Systems Pertinent items are noted in HPI.    Objective:    General appearance: alert and no distress Breasts: normal appearance, no masses or tenderness, Inspection negative, Normal to palpation without dominant masses, positive findings: nipple discharge on the left Abdomen: normal findings: soft, non-tender Pelvic: cervix normal in appearance, external genitalia normal, no adnexal masses or tenderness, no cervical motion tenderness, uterus normal size, shape, and consistency and vagina normal without discharge    Assessment:    Healthy female exam.  Galactorrhea   Plan:    Education reviewed: self breast exams. Follow up in: prn months.   Prolactin and TSH ordered.

## 2013-04-09 LAB — WET PREP BY MOLECULAR PROBE
Candida species: NEGATIVE
Trichomonas vaginosis: NEGATIVE

## 2013-04-09 LAB — PROLACTIN: Prolactin: 11.3 ng/mL

## 2013-04-09 LAB — GC/CHLAMYDIA PROBE AMP: CT Probe RNA: NEGATIVE

## 2013-04-09 LAB — TSH: TSH: 1.019 u[IU]/mL (ref 0.350–4.500)

## 2013-04-09 NOTE — Patient Instructions (Signed)
Galactorrhea Dysmenorrhea

## 2013-04-10 ENCOUNTER — Encounter (HOSPITAL_COMMUNITY)
Admission: RE | Admit: 2013-04-10 | Discharge: 2013-04-10 | Disposition: A | Discharge status: Home / Self Care | Payer: Medicaid Other | Source: Ambulatory Visit | Attending: Surgery | Admitting: Surgery

## 2013-04-10 LAB — SURGICAL PCR SCREEN
MRSA, PCR: NEGATIVE
Staphylococcus aureus: NEGATIVE

## 2013-04-10 LAB — BASIC METABOLIC PANEL
Calcium: 9.9 mg/dL (ref 8.4–10.5)
Creatinine, Ser: 0.78 mg/dL (ref 0.50–1.10)
GFR calc Af Amer: >90 mL/min (ref >90)
GFR calc non Af Amer: >90 mL/min (ref >90)

## 2013-04-10 LAB — CBC
Platelets: 251 10*3/uL (ref 150–400)
RDW: 13.8 % (ref 11.5–15.5)
WBC: 6.9 10*3/uL (ref 4.0–10.5)

## 2013-04-14 MED ORDER — CEFAZOLIN SODIUM-DEXTROSE 2-3 GM-% IV SOLR
2.0000 g | INTRAVENOUS | Status: AC
  Administered 2013-04-15: 2 g via INTRAVENOUS

## 2013-04-14 NOTE — H&P (Signed)
Patient ID: Julia [name], female DOB: [birth year], 27 y.o. MRN: [id number]  Chief Complaint   Patient presents with   .  New Evaluation     eval abd pain   HPI  Julia [name] is a 27 y.o. female.  HPI  This is a 27 year old female who presents with a several year history of epigastric and right upper quadrant abdominal pain hurting through to the back. This occurs during fatty meals. It now happens every day. She also has nausea and vomiting with this. She has had an extensive workup including ultrasound and HIDA which are negative. She does have mild chronic constipation. The pain is described as sharp and severe.  Past Medical History   Diagnosis  Date   .  Chlamydia  2007   .  Infection    .  Anxiety    .  Kidney stones    .  GERD (gastroesophageal reflux disease)    .  Back pain    .  Arthritis    .  Depression    .  Migraine    .  Dyspepsia    .  Sinusitis     Past Surgical History   Procedure  Laterality  Date   .  Dilation and curettage of uterus     .  Colonoscopy   2012   .  Upper gastrointestinal endoscopy   2012    Family History   Problem  Relation  Age of Onset   .  Anesthesia problems  Neg Hx    .  Diabetes type II  Mother    .  Hypertension  Mother    .  Diabetes  Mother    .  Hyperlipidemia  Father    .  Hypertension  Father    Social History  History   Substance Use Topics   .  Smoking status:  Never Smoker   .  Smokeless tobacco:  Never Used   .  Alcohol Use:  No   No Known Allergies  Current Outpatient Prescriptions   Medication  Sig  Dispense  Refill   .  ondansetron (ZOFRAN) 4 MG tablet  Take 1 tablet (4 mg total) by mouth every 6 (six) hours as needed for nausea.  20 tablet  0   .  promethazine (PHENERGAN) 12.5 MG tablet  Take 12.5 mg by mouth every 6 (six) hours as needed for nausea.     [name]  senna-docusate (SENOKOT-S) 8.6-50 MG per tablet  Take 1 tablet by mouth at bedtime.     .  sertraline (ZOLOFT) 50 MG tablet  Take 50 mg by  mouth daily.     .  sucralfate (CARAFATE) 1 GM/10ML suspension  Take 1 g by mouth 2 (two) times daily.     .  traMADol (ULTRAM) 50 MG tablet  Take 1 tablet (50 mg total) by mouth every 6 (six) hours as needed for pain.  40 tablet  2    No current facility-administered medications for this visit.   Review of Systems  Review of Systems  Constitutional: Negative for fever, chills and unexpected weight change.  HENT: Negative for hearing loss, congestion, sore throat, trouble swallowing and voice change.  Eyes: Negative for visual disturbance.  Respiratory: Negative for cough and wheezing.  Cardiovascular: Negative for chest pain, palpitations and leg swelling.  Gastrointestinal: Positive for nausea, vomiting, abdominal pain, constipation and abdominal distention. Negative for diarrhea, blood in stool and anal bleeding.  Genitourinary: Negative for hematuria, vaginal  bleeding and difficulty urinating.  Musculoskeletal: Negative for arthralgias.  Skin: Negative for rash and wound.  Neurological: Negative for seizures, syncope and headaches.  Hematological: Negative for adenopathy. Does not bruise/bleed easily.  Psychiatric/Behavioral: Negative for confusion.  Blood pressure 110/64, pulse 75, temperature 97 F (36.1 C), temperature source Temporal, resp. rate 18, height 5' (1.524 m), weight 134 lb 3.2 oz (60.873 kg), last menstrual period 02/13/2013.  Physical Exam  Physical Exam  Constitutional: She is oriented to person, place, and time. She appears well-developed and well-nourished. No distress.  HENT:  Head: Normocephalic and atraumatic.  Right Ear: External ear normal.  Left Ear: External ear normal.  Nose: Nose normal.  Mouth/Throat: Oropharynx is clear and moist. No oropharyngeal exudate.  Eyes: Conjunctivae are normal. Pupils are equal, round, and reactive to light. Right eye exhibits no discharge. Left eye exhibits no discharge. No scleral icterus.  Neck: Normal range of motion.  Neck supple. No tracheal deviation present. No thyromegaly present.  Cardiovascular: Normal rate, regular rhythm, normal heart sounds and intact distal pulses.  No murmur heard.  Pulmonary/Chest: Effort normal and breath sounds normal. No respiratory distress. She has no wheezes. She has no rales.  Abdominal: Soft. Bowel sounds are normal. There is tenderness. There is guarding.  There is moderate right upper quadrant abdominal tenderness with guarding  Musculoskeletal: Normal range of motion. She exhibits no edema and no tenderness.  Lymphadenopathy:  She has no cervical adenopathy.  Neurological: She is alert and oriented to person, place, and time. No cranial nerve deficit. Coordination normal.  Skin: Skin is warm and dry. No rash noted. She is not diaphoretic. No erythema.  Psychiatric: Her behavior is normal. Judgment normal.  Data Reviewed  I have reviewed all her x-ray and laboratory data  Assessment  Chronic cholecystitis  Plan  I do believe given her symptoms and physical examination that she has chronic cholecystitis and I recommend laparoscopic cholecystectomy. I discussed this with her in detail. I gave her literature regarding the surgical procedure. I discussed the risks which includes but is not limited to bleeding, infection, bile duct injury, bile leak, injury to surrounding structures, the chance this may not resolve all her symptoms, need for open surgery, et Karie Soda. I also discussed postop recovery. She understands and wishes to proceed. Likelihood of success is good

## 2013-04-15 ENCOUNTER — Encounter (HOSPITAL_COMMUNITY): Payer: Self-pay | Admitting: *Deleted

## 2013-04-15 ENCOUNTER — Inpatient Hospital Stay (HOSPITAL_COMMUNITY)
Admission: RE | Admit: 2013-04-15 | Discharge: 2013-04-17 | DRG: 419 | Disposition: A | Discharge status: Home / Self Care | Payer: Medicaid Other | Source: Ambulatory Visit | Attending: Surgery | Admitting: Surgery

## 2013-04-15 ENCOUNTER — Encounter (HOSPITAL_COMMUNITY)
Admission: RE | Disposition: A | Discharge status: Home / Self Care | Payer: Self-pay | Source: Ambulatory Visit | Attending: Surgery

## 2013-04-15 ENCOUNTER — Telehealth (INDEPENDENT_AMBULATORY_CARE_PROVIDER_SITE_OTHER): Payer: Self-pay

## 2013-04-15 ENCOUNTER — Ambulatory Visit (HOSPITAL_COMMUNITY): Payer: Medicaid Other | Admitting: Anesthesiology

## 2013-04-15 ENCOUNTER — Encounter (HOSPITAL_COMMUNITY): Payer: Self-pay | Admitting: Anesthesiology

## 2013-04-15 DIAGNOSIS — K824 Cholesterolosis of gallbladder: Secondary | ICD-10-CM

## 2013-04-15 DIAGNOSIS — K811 Chronic cholecystitis: Secondary | ICD-10-CM

## 2013-04-15 DIAGNOSIS — R338 Other retention of urine: Secondary | ICD-10-CM | POA: Diagnosis not present

## 2013-04-15 DIAGNOSIS — Y849 Medical procedure, unspecified as the cause of abnormal reaction of the patient, or of later complication, without mention of misadventure at the time of the procedure: Secondary | ICD-10-CM | POA: Diagnosis not present

## 2013-04-15 DIAGNOSIS — IMO0002 Reserved for concepts with insufficient information to code with codable children: Secondary | ICD-10-CM | POA: Diagnosis not present

## 2013-04-15 DIAGNOSIS — N9989 Other postprocedural complications and disorders of genitourinary system: Secondary | ICD-10-CM | POA: Diagnosis not present

## 2013-04-15 DIAGNOSIS — K5900 Constipation, unspecified: Secondary | ICD-10-CM | POA: Diagnosis present

## 2013-04-15 HISTORY — PX: CHOLECYSTECTOMY: SHX55

## 2013-04-15 SURGERY — LAPAROSCOPIC CHOLECYSTECTOMY
Anesthesia: General | Site: Abdomen | Wound class: Contaminated

## 2013-04-15 MED ORDER — BUPIVACAINE-EPINEPHRINE 0.25% -1:200000 IJ SOLN
INTRAMUSCULAR | Status: DC | PRN
  Administered 2013-04-15: 20 mL

## 2013-04-15 MED ORDER — OXYCODONE-ACETAMINOPHEN 5-325 MG PO TABS: 2.0000 | ORAL_TABLET | ORAL | Status: DC | PRN

## 2013-04-15 MED ORDER — ONDANSETRON HCL 4 MG/2ML IJ SOLN
4.0000 mg | Freq: Four times a day (QID) | INTRAMUSCULAR | Status: DC | PRN
  Administered 2013-04-15 – 2013-04-17 (×4): 4 mg via INTRAVENOUS
  Filled 2013-04-15 (×4): qty 2

## 2013-04-15 MED ORDER — MORPHINE SULFATE 4 MG/ML IJ SOLN
4.0000 mg | INTRAMUSCULAR | Status: DC | PRN
  Administered 2013-04-15 – 2013-04-16 (×7): 4 mg via INTRAVENOUS
  Filled 2013-04-15 (×7): qty 1

## 2013-04-15 MED ORDER — GLYCOPYRROLATE 0.2 MG/ML IJ SOLN
INTRAMUSCULAR | Status: DC | PRN
  Administered 2013-04-15: 0.4 mg via INTRAVENOUS

## 2013-04-15 MED ORDER — LACTATED RINGERS IV SOLN
INTRAVENOUS | Status: DC | PRN
  Administered 2013-04-15: 07:00:00 via INTRAVENOUS

## 2013-04-15 MED ORDER — ONDANSETRON HCL 4 MG/2ML IJ SOLN
INTRAMUSCULAR | Status: AC
  Filled 2013-04-15: qty 2

## 2013-04-15 MED ORDER — FENTANYL CITRATE 0.05 MG/ML IJ SOLN
INTRAMUSCULAR | Status: DC | PRN
  Administered 2013-04-15: 25 ug via INTRAVENOUS
  Administered 2013-04-15: 50 ug via INTRAVENOUS
  Administered 2013-04-15: 75 ug via INTRAVENOUS

## 2013-04-15 MED ORDER — NEOSTIGMINE METHYLSULFATE 1 MG/ML IJ SOLN
INTRAMUSCULAR | Status: DC | PRN
  Administered 2013-04-15: 3 mg via INTRAVENOUS

## 2013-04-15 MED ORDER — PROMETHAZINE HCL 25 MG/ML IJ SOLN
6.2500 mg | INTRAMUSCULAR | Status: DC | PRN
  Administered 2013-04-15: 6.25 mg via INTRAVENOUS

## 2013-04-15 MED ORDER — BUPIVACAINE-EPINEPHRINE 0.25% -1:200000 IJ SOLN
INTRAMUSCULAR | Status: AC
  Filled 2013-04-15: qty 1

## 2013-04-15 MED ORDER — SODIUM CHLORIDE 0.9 % IJ SOLN
3.0000 mL | Freq: Two times a day (BID) | INTRAMUSCULAR | Status: DC
  Administered 2013-04-16: 3 mL via INTRAVENOUS

## 2013-04-15 MED ORDER — MORPHINE SULFATE 2 MG/ML IJ SOLN
INTRAMUSCULAR | Status: AC
  Administered 2013-04-15: 4 mg via INTRAVENOUS
  Filled 2013-04-15: qty 2

## 2013-04-15 MED ORDER — MIDAZOLAM HCL 5 MG/5ML IJ SOLN
INTRAMUSCULAR | Status: DC | PRN
  Administered 2013-04-15: 2 mg via INTRAVENOUS

## 2013-04-15 MED ORDER — ACETAMINOPHEN 650 MG RE SUPP: 650.0000 mg | RECTAL | Status: DC | PRN

## 2013-04-15 MED ORDER — HYDROMORPHONE HCL PF 1 MG/ML IJ SOLN
INTRAMUSCULAR | Status: AC
  Filled 2013-04-15: qty 1

## 2013-04-15 MED ORDER — MORPHINE SULFATE 2 MG/ML IJ SOLN
INTRAMUSCULAR | Status: AC
  Filled 2013-04-15: qty 1

## 2013-04-15 MED ORDER — ACETAMINOPHEN 325 MG PO TABS: 650.0000 mg | ORAL_TABLET | ORAL | Status: DC | PRN

## 2013-04-15 MED ORDER — SODIUM CHLORIDE 0.9 % IJ SOLN: 3.0000 mL | INTRAMUSCULAR | Status: DC | PRN

## 2013-04-15 MED ORDER — OXYCODONE HCL 5 MG PO TABS: 5.0000 mg | ORAL_TABLET | Freq: Once | ORAL | Status: DC | PRN

## 2013-04-15 MED ORDER — KETOROLAC TROMETHAMINE 30 MG/ML IJ SOLN
30.0000 mg | Freq: Once | INTRAMUSCULAR | Status: AC
  Administered 2013-04-15: 30 mg via INTRAVENOUS

## 2013-04-15 MED ORDER — PROMETHAZINE HCL 25 MG/ML IJ SOLN
INTRAMUSCULAR | Status: AC
  Filled 2013-04-15: qty 1

## 2013-04-15 MED ORDER — SODIUM CHLORIDE 0.9 % IV SOLN
INTRAVENOUS | Status: DC
  Administered 2013-04-15 – 2013-04-16 (×2): via INTRAVENOUS

## 2013-04-15 MED ORDER — HYDROMORPHONE HCL PF 1 MG/ML IJ SOLN
0.2500 mg | INTRAMUSCULAR | Status: DC | PRN
  Administered 2013-04-15 (×4): 0.5 mg via INTRAVENOUS

## 2013-04-15 MED ORDER — OXYCODONE HCL 5 MG/5ML PO SOLN: 5.0000 mg | Freq: Once | ORAL | Status: DC | PRN

## 2013-04-15 MED ORDER — SODIUM CHLORIDE 0.9 % IV SOLN: 250.0000 mL | INTRAVENOUS | Status: DC | PRN

## 2013-04-15 MED ORDER — LIDOCAINE HCL (CARDIAC) 20 MG/ML IV SOLN
INTRAVENOUS | Status: DC | PRN
  Administered 2013-04-15: 100 mg via INTRAVENOUS

## 2013-04-15 MED ORDER — OXYCODONE HCL 5 MG PO TABS
ORAL_TABLET | ORAL | Status: AC
  Administered 2013-04-15: 10 mg
  Filled 2013-04-15: qty 2

## 2013-04-15 MED ORDER — SODIUM CHLORIDE 0.9 % IR SOLN
Status: DC | PRN
  Administered 2013-04-15: 1000 mL

## 2013-04-15 MED ORDER — ROCURONIUM BROMIDE 100 MG/10ML IV SOLN
INTRAVENOUS | Status: DC | PRN
  Administered 2013-04-15: 25 mg via INTRAVENOUS

## 2013-04-15 MED ORDER — ONDANSETRON HCL 4 MG/2ML IJ SOLN
INTRAMUSCULAR | Status: DC | PRN
  Administered 2013-04-15: 4 mg via INTRAVENOUS

## 2013-04-15 MED ORDER — 0.9 % SODIUM CHLORIDE (POUR BTL) OPTIME
TOPICAL | Status: DC | PRN
  Administered 2013-04-15: 1000 mL

## 2013-04-15 MED ORDER — OXYCODONE HCL 5 MG PO TABS
5.0000 mg | ORAL_TABLET | ORAL | Status: DC | PRN
  Administered 2013-04-15 – 2013-04-17 (×7): 10 mg via ORAL
  Filled 2013-04-15 (×6): qty 2

## 2013-04-15 MED ORDER — PROPOFOL 10 MG/ML IV BOLUS
INTRAVENOUS | Status: DC | PRN
  Administered 2013-04-15: 200 mg via INTRAVENOUS

## 2013-04-15 SURGICAL SUPPLY — 41 items
APL SKNCLS STERI-STRIP NONHPOA (GAUZE/BANDAGES/DRESSINGS) ×1
APPLIER CLIP 5 13 M/L LIGAMAX5 (MISCELLANEOUS) ×2
APR CLP MED LRG 5 ANG JAW (MISCELLANEOUS) ×1
BAG SPEC RTRVL LRG 6X4 10 (ENDOMECHANICALS) ×1
BANDAGE ADHESIVE 1X3 (GAUZE/BANDAGES/DRESSINGS) ×9 IMPLANT
BENZOIN TINCTURE PRP APPL 2/3 (GAUZE/BANDAGES/DRESSINGS) ×2 IMPLANT
CANISTER SUCTION 2500CC (MISCELLANEOUS) ×2 IMPLANT
CHLORAPREP W/TINT 26ML (MISCELLANEOUS) ×2 IMPLANT
CLIP APPLIE 5 13 M/L LIGAMAX5 (MISCELLANEOUS) ×1 IMPLANT
CLOTH BEACON ORANGE TIMEOUT ST (SAFETY) ×2 IMPLANT
COVER MAYO STAND STRL (DRAPES) IMPLANT
COVER SURGICAL LIGHT HANDLE (MISCELLANEOUS) ×2 IMPLANT
DECANTER SPIKE VIAL GLASS SM (MISCELLANEOUS) ×2 IMPLANT
DRAPE C-ARM 42X72 X-RAY (DRAPES) IMPLANT
ELECT REM PT RETURN 9FT ADLT (ELECTROSURGICAL) ×2
ELECTRODE REM PT RTRN 9FT ADLT (ELECTROSURGICAL) ×1 IMPLANT
GLOVE BIO SURGEON STRL SZ 6.5 (GLOVE) ×1 IMPLANT
GLOVE BIOGEL PI IND STRL 7.5 (GLOVE) IMPLANT
GLOVE BIOGEL PI INDICATOR 7.5 (GLOVE) ×1
GLOVE ECLIPSE 7.5 STRL STRAW (GLOVE) ×1 IMPLANT
GLOVE SURG SIGNA 7.5 PF LTX (GLOVE) ×2 IMPLANT
GOWN PREVENTION PLUS XLARGE (GOWN DISPOSABLE) ×2 IMPLANT
GOWN STRL NON-REIN LRG LVL3 (GOWN DISPOSABLE) ×5 IMPLANT
KIT BASIN OR (CUSTOM PROCEDURE TRAY) ×2 IMPLANT
KIT ROOM TURNOVER OR (KITS) ×2 IMPLANT
NS IRRIG 1000ML POUR BTL (IV SOLUTION) ×2 IMPLANT
PAD ARMBOARD 7.5X6 YLW CONV (MISCELLANEOUS) ×4 IMPLANT
POUCH SPECIMEN RETRIEVAL 10MM (ENDOMECHANICALS) ×1 IMPLANT
SCISSORS LAP 5X35 DISP (ENDOMECHANICALS) IMPLANT
SET CHOLANGIOGRAPH 5 50 .035 (SET/KITS/TRAYS/PACK) IMPLANT
SET IRRIG TUBING LAPAROSCOPIC (IRRIGATION / IRRIGATOR) ×2 IMPLANT
SLEEVE ENDOPATH XCEL 5M (ENDOMECHANICALS) ×4 IMPLANT
SPECIMEN JAR SMALL (MISCELLANEOUS) ×2 IMPLANT
SUT MNCRL AB 4-0 PS2 18 (SUTURE) ×1 IMPLANT
SUT MON AB 4-0 PC3 18 (SUTURE) ×2 IMPLANT
TOWEL OR 17X24 6PK STRL BLUE (TOWEL DISPOSABLE) ×2 IMPLANT
TOWEL OR 17X26 10 PK STRL BLUE (TOWEL DISPOSABLE) ×2 IMPLANT
TRAY LAPAROSCOPIC (CUSTOM PROCEDURE TRAY) ×2 IMPLANT
TROCAR XCEL BLUNT TIP 100MML (ENDOMECHANICALS) ×2 IMPLANT
TROCAR XCEL NON-BLD 5MMX100MML (ENDOMECHANICALS) ×2 IMPLANT
WATER STERILE IRR 1000ML POUR (IV SOLUTION) IMPLANT

## 2013-04-15 NOTE — Progress Notes (Signed)
Rn notified that patient was having trouble urinating. Patient voided in the hat for 200 and patient was bladder scanned for 500. Orders placed to in and out. In and out performed at 1815 with 700 ml of clear urine.

## 2013-04-15 NOTE — Op Note (Signed)

## 2013-04-15 NOTE — Preoperative (Signed)
Beta Blockers   Reason not to administer Beta Blockers:Not Applicable 

## 2013-04-15 NOTE — Transfer of Care (Signed)
Immediate Anesthesia Transfer of Care Note  Patient: Julia [name]  Procedure(s) Performed: Procedure(s): LAPAROSCOPIC CHOLECYSTECTOMY (N/A)  Patient Location: PACU  Anesthesia Type:General  Level of Consciousness: awake, alert  and oriented  Airway & Oxygen Therapy: Patient Spontanous Breathing and Patient connected to nasal cannula oxygen  Post-op Assessment: Report given to PACU RN, Post -op Vital signs reviewed and stable and Patient moving all extremities X 4  Post vital signs: Reviewed and stable  Complications: No apparent anesthesia complications

## 2013-04-15 NOTE — Interval H&P Note (Signed)
History and Physical Interval Note: no change in H and P  04/15/2013 6:54 AM  Julia [name]  has presented today for surgery, with the diagnosis of chronic cholecystitis   The various methods of treatment have been discussed with the patient and family. After consideration of risks, benefits and other options for treatment, the patient has consented to  Procedure(s): LAPAROSCOPIC CHOLECYSTECTOMY possible IOC  (N/A) as a surgical intervention .  The patient's history has been reviewed, patient examined, no change in status, stable for surgery.  I have reviewed the patient's chart and labs.  Questions were answered to the patient's satisfaction.     Cypher Paule A

## 2013-04-15 NOTE — Telephone Encounter (Signed)
Shor stay - Juliette Alcide ask order for Over night stay for this patient. Dr Magnus Ivan ok'd ONS

## 2013-04-15 NOTE — Anesthesia Preprocedure Evaluation (Addendum)
Anesthesia Evaluation  Patient identified by MRN, date of birth, ID band Patient awake    Reviewed: Allergy & Precautions, H&P , NPO status , Patient's Chart, lab work & pertinent test results  Airway Mallampati: I      Dental  (+) Teeth Intact and Dental Advisory Given   Pulmonary neg pulmonary ROS,    Pulmonary exam normal       Cardiovascular negative cardio ROS      Neuro/Psych  Headaches,    GI/Hepatic GERD-  Medicated,  Endo/Other  negative endocrine ROS  Renal/GU negative Renal ROS  negative genitourinary   Musculoskeletal   Abdominal   Peds  Hematology negative hematology ROS (+)   Anesthesia Other Findings   Reproductive/Obstetrics negative OB ROS                        Anesthesia Physical Anesthesia Plan  ASA: II  Anesthesia Plan: General ETT   Post-op Pain Management:    Induction: Intravenous  Airway Management Planned: Oral ETT  Additional Equipment:   Intra-op Plan:   Post-operative Plan: Extubation in OR  Informed Consent: I have reviewed the patients History and Physical, chart, labs and discussed the procedure including the risks, benefits and alternatives for the proposed anesthesia with the patient or authorized representative who has indicated his/her understanding and acceptance.   Dental advisory given  Plan Discussed with: Anesthesiologist, CRNA and Surgeon  Anesthesia Plan Comments:        Anesthesia Quick Evaluation

## 2013-04-15 NOTE — Progress Notes (Signed)
C/O NAUSEA  AND PAIN AND STATES DOES FEEL LIKE GOING HOME

## 2013-04-15 NOTE — Progress Notes (Signed)
CALLED DR [facility] OFFICE AND NURSE TO CALL ME BACK

## 2013-04-15 NOTE — Anesthesia Postprocedure Evaluation (Signed)
Anesthesia Post Note  Patient: Julia [name]  Procedure(s) Performed: Procedure(s) (LRB): LAPAROSCOPIC CHOLECYSTECTOMY (N/A)  Anesthesia type: general  Patient location: PACU  Post pain: Pain level controlled  Post assessment: Patient's Cardiovascular Status Stable  Last Vitals:  Filed Vitals:   04/15/13 0920  BP: 105/75  Pulse:   Temp:   Resp:     Post vital signs: Reviewed and stable  Level of consciousness: sedated  Complications: No apparent anesthesia complications

## 2013-04-15 NOTE — Anesthesia Procedure Notes (Signed)
Procedure Name: Intubation Date/Time: 04/15/2013 7:31 AM Performed by: Quentin Ore Pre-anesthesia Checklist: Patient identified, Emergency Drugs available, Suction available, Patient being monitored and Timeout performed Patient Re-evaluated:Patient Re-evaluated prior to inductionOxygen Delivery Method: Circle system utilized Preoxygenation: Pre-oxygenation with 100% oxygen Intubation Type: IV induction Ventilation: Mask ventilation without difficulty Laryngoscope Size: Mac and 3 Grade View: Grade I Tube type: Oral Tube size: 7.0 mm Number of attempts: 1 Airway Equipment and Method: Stylet Placement Confirmation: ETT inserted through vocal cords under direct vision,  positive ETCO2 and breath sounds checked- equal and bilateral Secured at: 21 cm Tube secured with: Tape Dental Injury: Teeth and Oropharynx as per pre-operative assessment  Comments: AOI per Beola Cord, paramedic student with [employer] supervision.

## 2013-04-16 ENCOUNTER — Encounter (HOSPITAL_COMMUNITY): Payer: Self-pay | Admitting: Surgery

## 2013-04-16 ENCOUNTER — Other Ambulatory Visit: Payer: Self-pay | Admitting: *Deleted

## 2013-04-16 DIAGNOSIS — B9689 Other specified bacterial agents as the cause of diseases classified elsewhere: Secondary | ICD-10-CM

## 2013-04-16 LAB — COMPREHENSIVE METABOLIC PANEL
BUN: 6 mg/dL (ref 6–23)
CO2: 21 mEq/L (ref 19–32)
Chloride: 104 mEq/L (ref 96–112)
Creatinine, Ser: 0.81 mg/dL (ref 0.50–1.10)
GFR calc non Af Amer: >90 mL/min (ref >90)
Glucose, Bld: 78 mg/dL (ref 70–99)
Total Bilirubin: 0.5 mg/dL (ref 0.3–1.2)

## 2013-04-16 LAB — URINALYSIS, ROUTINE W REFLEX MICROSCOPIC
Glucose, UA: NEGATIVE mg/dL
Hgb urine dipstick: NEGATIVE
Protein, ur: NEGATIVE mg/dL
Specific Gravity, Urine: 1.011 (ref 1.005–1.030)

## 2013-04-16 LAB — CBC
HCT: 37.4 % (ref 36.0–46.0)
Hemoglobin: 12.9 g/dL (ref 12.0–15.0)
MCV: 78.1 fL (ref 78.0–100.0)
RBC: 4.79 MIL/uL (ref 3.87–5.11)
WBC: 7.2 10*3/uL (ref 4.0–10.5)

## 2013-04-16 MED ORDER — BETHANECHOL CHLORIDE 10 MG PO TABS
10.0000 mg | ORAL_TABLET | Freq: Three times a day (TID) | ORAL | Status: DC
  Administered 2013-04-16 – 2013-04-17 (×3): 10 mg via ORAL
  Filled 2013-04-16 (×6): qty 1

## 2013-04-16 MED ORDER — METRONIDAZOLE 500 MG PO TABS: 500.0000 mg | ORAL_TABLET | Freq: Two times a day (BID) | ORAL | Status: DC

## 2013-04-16 MED ORDER — PROMETHAZINE HCL 25 MG/ML IJ SOLN
12.5000 mg | Freq: Four times a day (QID) | INTRAMUSCULAR | Status: DC | PRN
  Administered 2013-04-16 (×2): 12.5 mg via INTRAVENOUS
  Filled 2013-04-16 (×2): qty 1

## 2013-04-16 NOTE — Progress Notes (Signed)
Patient complaining of abdominal pain stating she felt like her bladder is about to burst, patient urinated 100 cc , encourage the patient to ambulate and then patient went to the bathroom and urinated additional 50 cc, bladder scan done post void and noted 568 urine, MD paged.

## 2013-04-16 NOTE — Progress Notes (Signed)
1 Day Post-Op  Subjective: Foley inserted last night for postop urinary retention Pt complains of incisional pain this morning  Objective: Vital signs in last 24 hours: Temp:  [96.8 F (36 C)-98.8 F (37.1 C)] 98.8 F (37.1 C) (05/06 0550) Pulse Rate:  [56-98] 76 (05/06 0550) Resp:  [7-23] 18 (05/06 0550) BP: (87-127)/(53-84) 110/68 mmHg (05/06 0550) SpO2:  [95 %-100 %] 98 % (05/06 0550) Last BM Date: 04/14/13  Intake/Output from previous day: 05/05 0701 - 05/06 0700 In: 1749 [I.V.:1749] Out: 1301 [Urine:1301] Intake/Output this shift:    Abdomen soft, mildly tender Dressings dry  Lab Results:  No results found for this basename: WBC, HGB, HCT, PLT,  in the last 72 hours BMET No results found for this basename: NA, K, CL, CO2, GLUCOSE, BUN, CREATININE, CALCIUM,  in the last 72 hours PT/INR No results found for this basename: LABPROT, INR,  in the last 72 hours ABG No results found for this basename: PHART, PCO2, PO2, HCO3,  in the last 72 hours  Studies/Results: No results found.  Anti-infectives: Anti-infectives   Start     Dose/Rate Route Frequency Ordered Stop   04/15/13 0600  ceFAZolin (ANCEF) IVPB 2 g/50 mL premix     2 g 100 mL/hr over 30 Minutes Intravenous On call to O.R. 04/14/13 1236 04/15/13 0732      Assessment/Plan: s/p Procedure(s): LAPAROSCOPIC CHOLECYSTECTOMY (N/A)  Post op urinary retention.  Will check u/a and d/c foley later today.  Will check labs Make regular admit  LOS: 1 day    Neshawn Aird A 04/16/2013

## 2013-04-17 NOTE — Discharge Summary (Signed)
Physician Discharge Summary  Patient ID: Julia [name] MRN: [id number] DOB/AGE: 1986/06/15 27 y.o.  Admit date: 04/15/2013 Discharge date: 04/17/2013  Admission Diagnoses:  Discharge Diagnoses:  Active Problems:   * No active hospital problems. * chronic cholecystitis Post op urinary retention  Discharged Condition: good  Hospital Course: admitted post op lap chole.  Foley had to be placed twice for post op urinary retention.  Otherwise doing well.  U/A negative for UTI.  Discharged home POD#2 with foley in place  Consults: None  Significant Diagnostic Studies:   Treatments: surgery: lap chole  Discharge Exam: Blood pressure 102/57, pulse 82, temperature 98.2 F (36.8 C), temperature source Oral, resp. rate 20, height 5' (1.524 m), weight 131 lb 13.4 oz (59.8 kg), SpO2 99.00%. General appearance: alert, cooperative and no distress Resp: clear to auscultation bilaterally GI: soft, non-tender; bowel sounds normal; no masses,  no organomegaly Incision/Wound: clean  Disposition: 01-Home or Self Care   Future Appointments Provider Department Dept Phone   05/03/2013 3:10 PM Shelly Rubenstein, MD [facility], [place] [phone]       Medication List    STOP taking these medications       HYDROcodone-acetaminophen 5-325 MG per tablet  Commonly known as:  NORCO/VICODIN      TAKE these medications       clonazePAM 0.5 MG tablet  Commonly known as:  KLONOPIN  Take 0.5 mg by mouth at bedtime as needed for anxiety.     diphenhydrAMINE 25 mg capsule  Commonly known as:  BENADRYL  Take 25 mg by mouth every 6 (six) hours as needed for allergies.     ondansetron 4 MG tablet  Commonly known as:  ZOFRAN  Take 1 tablet (4 mg total) by mouth every 6 (six) hours as needed for nausea.     oxyCODONE-acetaminophen 5-325 MG per tablet  Commonly known as:  PERCOCET/ROXICET  Take 2 tablets by mouth every 4 (four) hours as needed for pain.     promethazine 12.5 MG  tablet  Commonly known as:  PHENERGAN  Take 12.5 mg by mouth every 6 (six) hours as needed for nausea.     senna-docusate 8.6-50 MG per tablet  Commonly known as:  Senokot-S  Take 1 tablet by mouth at bedtime.     sucralfate 1 GM/10ML suspension  Commonly known as:  CARAFATE  Take 1 g by mouth 2 (two) times daily.      ASK your doctor about these medications       metroNIDAZOLE 500 MG tablet  Commonly known as:  FLAGYL  Take 1 tablet (500 mg total) by mouth 2 (two) times daily.           Follow-up Information   Follow up with [facility] A, MD. Call on 04/18/2013. (needs foley removal)    Contact information:   [address] [place] [address] [phone]       Signed: Shelly Rubenstein 04/17/2013, 7:31 AM

## 2013-04-17 NOTE — Progress Notes (Signed)
Patient ID: Julia [name], female   DOB: 06-May-1986, 27 y.o.   MRN: [id number]  Foley had to be replaced for urinary retention She is otherwise doing well  U/A negative for UTI  Plan: Discharge home with foley and remove in office tomorrow

## 2013-04-18 ENCOUNTER — Telehealth (INDEPENDENT_AMBULATORY_CARE_PROVIDER_SITE_OTHER): Payer: Self-pay

## 2013-04-18 ENCOUNTER — Telehealth (INDEPENDENT_AMBULATORY_CARE_PROVIDER_SITE_OTHER): Payer: Self-pay | Admitting: *Deleted

## 2013-04-18 ENCOUNTER — Ambulatory Visit (INDEPENDENT_AMBULATORY_CARE_PROVIDER_SITE_OTHER): Payer: Medicaid Other

## 2013-04-18 DIAGNOSIS — K811 Chronic cholecystitis: Secondary | ICD-10-CM

## 2013-04-18 NOTE — Telephone Encounter (Signed)
Pt called stating she has been having itching with all narcotics. No rash,hives,redness or swelling. Pt states she has taken one dose of benadryl but still has some itching. Pt advised to continue benadryl and she can try oatmeal product shower. Pt advised itching will probably resolve when she is able to d/c narcotic meds. Pt will continue benadryl and call if above symptoms occur.

## 2013-04-18 NOTE — Progress Notes (Signed)
The pt came in for her nurse only visit today to have the foley cath removed but the pt stated she was still having a lot of pain since being home from the lap chole. The pt had surgery by Dr Magnus Ivan on 04/15/13. I advised pt that I would have to page Dr Magnus Ivan to run this by him to see what he would advise. Dr Magnus Ivan called me back and he advised pt to try something stronger for her pain medicine. Dr Magnus Ivan ordered the Oxycodone 7.5mg  #30 so I got Dr Johna Sheriff to sign the rx. I made a copy for the chart. I notified the pt. The pt then laid down on the table so I could remove the foley. I removed the fluid from the balloon with a 10cc syringe and then removed the foley cath. The pt tolerated this fine. I advised pt to go home a drink plenty of fluids b/c we want her urinating on her own by lunch time. I advised pt if she is not urinating by lunch time she needs to call our office. The pt understands.

## 2013-04-18 NOTE — Telephone Encounter (Signed)
Patient has been to multiple pharmacies however they do not care Oxycodone 7.5mg  so the prescription was returned to this RN and a new prescription given for Percocet 7.5/325mg  #30.

## 2013-04-23 ENCOUNTER — Telehealth (INDEPENDENT_AMBULATORY_CARE_PROVIDER_SITE_OTHER): Payer: Self-pay | Admitting: General Surgery

## 2013-04-23 DIAGNOSIS — M549 Dorsalgia, unspecified: Secondary | ICD-10-CM

## 2013-04-23 DIAGNOSIS — Z9049 Acquired absence of other specified parts of digestive tract: Secondary | ICD-10-CM

## 2013-04-23 NOTE — Telephone Encounter (Signed)
Spoke with patient. Made her aware everything sounds okay because she had normal labs but we will get a HIDA scan to make sure. Scan ordered and given to referral coordinator to set up. Patient aware she will be calling with appt.

## 2013-04-23 NOTE — Telephone Encounter (Signed)
Spoke with patient she is aware of  Test at [facility]  05/14/13 645

## 2013-04-23 NOTE — Telephone Encounter (Signed)
She has a very low pain tolerance.  I have already checked labs on her post op.  She didn't have stones. Regardless, for completeness sake, she needs a HIDA scan to r/o a bile leak

## 2013-04-23 NOTE — Telephone Encounter (Signed)
Patient calling one week status post lap chole complaining of middle back pain and shortness of breath that started approximately 3 days ago. She states the pain is constant. She is also having nausea with no vomiting. She feels when she eats or drinks anything she is having the sensation that it is getting stuck. She is having no fevers. She moved her bowels for the first time after surgery on Sunday 04/21/2013. She also had a bowel movement today. She is having general soreness, but no real pain in the abdomen. Please advise. [phone].

## 2013-04-23 NOTE — Telephone Encounter (Signed)
Left message with patient to call back for appt at [facility] on 05/14/13 6:45am

## 2013-04-23 NOTE — Addendum Note (Signed)
Addended byLiliana Cline on: 04/23/2013 11:22 AM   Modules accepted: Orders

## 2013-04-25 ENCOUNTER — Emergency Department (HOSPITAL_COMMUNITY)
Admission: EM | Admit: 2013-04-25 | Discharge: 2013-04-25 | Disposition: A | Discharge status: Home / Self Care | Payer: Medicaid Other | Attending: Emergency Medicine | Admitting: Emergency Medicine

## 2013-04-25 ENCOUNTER — Emergency Department (HOSPITAL_COMMUNITY): Payer: Medicaid Other

## 2013-04-25 ENCOUNTER — Telehealth (INDEPENDENT_AMBULATORY_CARE_PROVIDER_SITE_OTHER): Payer: Self-pay | Admitting: *Deleted

## 2013-04-25 ENCOUNTER — Encounter (HOSPITAL_COMMUNITY): Payer: Self-pay | Admitting: Emergency Medicine

## 2013-04-25 DIAGNOSIS — F411 Generalized anxiety disorder: Secondary | ICD-10-CM | POA: Insufficient documentation

## 2013-04-25 DIAGNOSIS — J069 Acute upper respiratory infection, unspecified: Secondary | ICD-10-CM | POA: Insufficient documentation

## 2013-04-25 DIAGNOSIS — Z79899 Other long term (current) drug therapy: Secondary | ICD-10-CM | POA: Insufficient documentation

## 2013-04-25 DIAGNOSIS — F3289 Other specified depressive episodes: Secondary | ICD-10-CM | POA: Insufficient documentation

## 2013-04-25 DIAGNOSIS — R61 Generalized hyperhidrosis: Secondary | ICD-10-CM | POA: Insufficient documentation

## 2013-04-25 DIAGNOSIS — Z9089 Acquired absence of other organs: Secondary | ICD-10-CM | POA: Insufficient documentation

## 2013-04-25 DIAGNOSIS — R112 Nausea with vomiting, unspecified: Secondary | ICD-10-CM | POA: Insufficient documentation

## 2013-04-25 DIAGNOSIS — J3489 Other specified disorders of nose and nasal sinuses: Secondary | ICD-10-CM | POA: Insufficient documentation

## 2013-04-25 DIAGNOSIS — R109 Unspecified abdominal pain: Secondary | ICD-10-CM | POA: Insufficient documentation

## 2013-04-25 DIAGNOSIS — J029 Acute pharyngitis, unspecified: Secondary | ICD-10-CM | POA: Insufficient documentation

## 2013-04-25 DIAGNOSIS — R6883 Chills (without fever): Secondary | ICD-10-CM | POA: Insufficient documentation

## 2013-04-25 DIAGNOSIS — Z8709 Personal history of other diseases of the respiratory system: Secondary | ICD-10-CM | POA: Insufficient documentation

## 2013-04-25 DIAGNOSIS — Z8719 Personal history of other diseases of the digestive system: Secondary | ICD-10-CM | POA: Insufficient documentation

## 2013-04-25 DIAGNOSIS — F329 Major depressive disorder, single episode, unspecified: Secondary | ICD-10-CM | POA: Insufficient documentation

## 2013-04-25 DIAGNOSIS — Z8619 Personal history of other infectious and parasitic diseases: Secondary | ICD-10-CM | POA: Insufficient documentation

## 2013-04-25 DIAGNOSIS — Z87442 Personal history of urinary calculi: Secondary | ICD-10-CM | POA: Insufficient documentation

## 2013-04-25 DIAGNOSIS — G8918 Other acute postprocedural pain: Secondary | ICD-10-CM | POA: Insufficient documentation

## 2013-04-25 DIAGNOSIS — Z8679 Personal history of other diseases of the circulatory system: Secondary | ICD-10-CM | POA: Insufficient documentation

## 2013-04-25 DIAGNOSIS — R197 Diarrhea, unspecified: Secondary | ICD-10-CM | POA: Insufficient documentation

## 2013-04-25 DIAGNOSIS — Z8739 Personal history of other diseases of the musculoskeletal system and connective tissue: Secondary | ICD-10-CM | POA: Insufficient documentation

## 2013-04-25 DIAGNOSIS — IMO0001 Reserved for inherently not codable concepts without codable children: Secondary | ICD-10-CM | POA: Insufficient documentation

## 2013-04-25 LAB — URINE MICROSCOPIC-ADD ON

## 2013-04-25 LAB — COMPREHENSIVE METABOLIC PANEL
ALT: 105 U/L — ABNORMAL HIGH (ref 0–35)
Alkaline Phosphatase: 102 U/L (ref 39–117)
BUN: 11 mg/dL (ref 6–23)
CO2: 20 mEq/L (ref 19–32)
Chloride: 104 mEq/L (ref 96–112)
GFR calc Af Amer: >90 mL/min (ref >90)
GFR calc non Af Amer: >90 mL/min (ref >90)
Glucose, Bld: 100 mg/dL — ABNORMAL HIGH (ref 70–99)
Potassium: 3.7 mEq/L (ref 3.5–5.1)
Sodium: 139 mEq/L (ref 135–145)
Total Bilirubin: 0.4 mg/dL (ref 0.3–1.2)
Total Protein: 7.5 g/dL (ref 6.0–8.3)

## 2013-04-25 LAB — URINALYSIS, ROUTINE W REFLEX MICROSCOPIC
Bilirubin Urine: NEGATIVE
Ketones, ur: NEGATIVE mg/dL
Leukocytes, UA: NEGATIVE
Nitrite: NEGATIVE
Protein, ur: NEGATIVE mg/dL
Urobilinogen, UA: 0.2 mg/dL (ref 0.0–1.0)
pH: 6 (ref 5.0–8.0)

## 2013-04-25 LAB — CBC WITH DIFFERENTIAL/PLATELET
Basophils Relative: 0 % (ref 0–1)
HCT: 36.4 % (ref 36.0–46.0)
Hemoglobin: 12 g/dL (ref 12.0–15.0)
Lymphs Abs: 1.8 10*3/uL (ref 0.7–4.0)
MCHC: 33 g/dL (ref 30.0–36.0)
Monocytes Absolute: 0.6 10*3/uL (ref 0.1–1.0)
Monocytes Relative: 7 % (ref 3–12)
Neutro Abs: 5.3 10*3/uL (ref 1.7–7.7)
Neutrophils Relative %: 68 % (ref 43–77)
RBC: 4.72 MIL/uL (ref 3.87–5.11)

## 2013-04-25 MED ORDER — IBUPROFEN 800 MG PO TABS
800.0000 mg | ORAL_TABLET | Freq: Once | ORAL | Status: AC
  Administered 2013-04-25: 800 mg via ORAL
  Filled 2013-04-25: qty 1

## 2013-04-25 MED ORDER — OXYCODONE-ACETAMINOPHEN 5-325 MG PO TABS: 2.0000 | ORAL_TABLET | Freq: Four times a day (QID) | ORAL | Status: DC | PRN

## 2013-04-25 MED ORDER — PROMETHAZINE HCL 25 MG PO TABS
25.0000 mg | ORAL_TABLET | Freq: Once | ORAL | Status: AC
  Administered 2013-04-25: 25 mg via ORAL
  Filled 2013-04-25: qty 1

## 2013-04-25 MED ORDER — PROMETHAZINE HCL 25 MG PO TABS: 25.0000 mg | ORAL_TABLET | Freq: Four times a day (QID) | ORAL | Status: DC | PRN

## 2013-04-25 MED ORDER — ONDANSETRON HCL 4 MG/2ML IJ SOLN
4.0000 mg | INTRAMUSCULAR | Status: AC
  Administered 2013-04-25: 4 mg via INTRAVENOUS
  Filled 2013-04-25: qty 2

## 2013-04-25 MED ORDER — TECHNETIUM TC 99M MEBROFENIN IV KIT
5.3000 | PACK | Freq: Once | INTRAVENOUS | Status: AC | PRN
  Administered 2013-04-25: 5.3 via INTRAVENOUS

## 2013-04-25 MED ORDER — SODIUM CHLORIDE 0.9 % IV BOLUS (SEPSIS)
1000.0000 mL | Freq: Once | INTRAVENOUS | Status: AC
  Administered 2013-04-25: 1000 mL via INTRAVENOUS

## 2013-04-25 NOTE — ED Provider Notes (Signed)
Transfer of patient from Antony Madura, PA-C at 6:10AM.  Julia [name] is a 27 y/o F presenting to the ED with abdominal pain, nausea, NB/NB emesis, watery diarrhea onset yesterday, with URI viral illness symptoms starting approximately 2 days ago. Patient had laparoscopic surgery performed on 04/15/2013 by Dr. Magnus Ivan, HIDA scan scheduled for 05/14/2013. No evidence of cholelithiasis post-op. Patient has been calling office numerous times per day regarding abdominal discomfort. On physical exam, abdomen: BS normoactive, soft, focal epigastric tenderness, negative peritoneal signs/guarding/masses. Patient hemodynamically stable. Liver enzymes elevated since one week ago, 1 day after surgery. Dr. Norlene Campbell spoke with General Surgery - recommended HIDA scan to be performed to rule out bile leak - imaging ordered. Will call General Surgery once results are in. Patient given Ibuprofen 800 mg tablet for pain, Zofran 4 mg for nausea; patient's discomfort will be managed in ED setting. HIDA scan - negative biliary leak observed.  Discussed findings with Dr. Fonnie Jarvis - Dr. Fonnie Jarvis to call general surgery for recommendations.  Dr. Fonnie Jarvis spoke with general surgery - patient to be discharged and follow-up as outpatient with Dr. Magnus Ivan. Patient discharged. Referred to follow-up with Dr. Magnus Ivan. Patient is discharged with pain medications and antiemetics. Monitor symptoms closely.    Raymon Mutton, PA-C 04/25/13 1715

## 2013-04-25 NOTE — ED Notes (Signed)
PT RETURNED FROM VASCULAR STUDY.

## 2013-04-25 NOTE — ED Provider Notes (Signed)
Medical screening examination/treatment/procedure(s) were conducted as a shared visit with non-physician practitioner(s) and myself.  I personally evaluated the patient during the encounter.  Pt with recent lap chole 10 days ago with Dr Magnus Ivan, has had persistent n/v/d, abd pain.  No fevers.  Was scheduled for outpatient hida, but reports pain is increasing.  D/w Dr Maisie Fus who recommends HIDA scan over CT abd/pelvis given no fevers or elevated wbc looking for bile leak.  Care to be passed to oncoming team for recheck of pain and HIDA results.  Olivia Mackie, MD 04/25/13 [phone]

## 2013-04-25 NOTE — ED Provider Notes (Signed)
History     CSN: [id number]  Arrival date & time 04/25/13  0435   First MD Initiated Contact with Patient 04/25/13 [phone]      Chief Complaint  Patient presents with  . Emesis  . Diarrhea  . Abdominal Pain    (Consider location/radiation/quality/duration/timing/severity/associated sxs/prior treatment) HPI Comments: Patient is a 27 year old female with a history of laparoscopic cholecystectomy on 04/15/2013 and anxiety who presents for nausea, NB/NB emesis x4, and watery diarrhea with onset last night. Patient states symptoms were preceded by myalgias and URI symptoms and she admits to associated generalized abdominal cramping, chills and diaphoresis. Patient denies any aggravating or alleviating factors of these symptoms. Patient denies fevers, CP, SOB, urinary symptoms, melena, hematochezia, foul smelling stool, vaginal complaints, and numbness or tingling in her extremities.  Patient is a 28 y.o. female presenting with vomiting, diarrhea, and abdominal pain. The history is provided by the patient. No language interpreter was used.  Emesis Associated symptoms: abdominal pain, chills, diarrhea, myalgias and sore throat   Diarrhea Associated symptoms: abdominal pain, chills, diaphoresis, myalgias and vomiting   Associated symptoms: no fever   Abdominal Pain Associated symptoms: chills, diarrhea, nausea, sore throat and vomiting   Associated symptoms: no chest pain, no constipation, no dysuria, no fever, no hematuria and no shortness of breath     Past Medical History  Diagnosis Date  . Chlamydia 2007  . Infection   . Anxiety   . Kidney stones   . GERD (gastroesophageal reflux disease)   . Back pain   . Arthritis   . Depression   . Migraine   . Dyspepsia   . Sinusitis     Past Surgical History  Procedure Laterality Date  . Dilation and curettage of uterus    . Colonoscopy  2012  . Upper gastrointestinal endoscopy  2012  . Cholecystectomy N/A 04/15/2013    Procedure:  LAPAROSCOPIC CHOLECYSTECTOMY;  Surgeon: Shelly Rubenstein, MD;  Location: [facility] OR;  Service: General;  Laterality: N/A;    Family History  Problem Relation Age of Onset  . Anesthesia problems Neg Hx   . Diabetes type II Mother   . Hypertension Mother   . Diabetes Mother   . Hyperlipidemia Father   . Hypertension Father     History  Substance Use Topics  . Smoking status: Never Smoker   . Smokeless tobacco: Never Used  . Alcohol Use: No    OB History   Grav Para Term Preterm Abortions TAB SAB Ect Mult Living   3 2   1  1   2       Review of Systems  Constitutional: Positive for chills and diaphoresis. Negative for fever.  HENT: Positive for congestion, sore throat and rhinorrhea. Negative for neck pain and neck stiffness.   Eyes: Negative for visual disturbance.  Respiratory: Negative for shortness of breath.   Cardiovascular: Negative for chest pain.  Gastrointestinal: Positive for nausea, vomiting, abdominal pain and diarrhea. Negative for constipation and blood in stool.  Genitourinary: Negative for dysuria and hematuria.  Musculoskeletal: Positive for myalgias.  Skin: Negative for color change, pallor and rash.  Neurological: Negative for weakness and numbness.  All other systems reviewed and are negative.    Allergies  Review of patient's allergies indicates no known allergies.  Home Medications   Current Outpatient Rx  Name  Route  Sig  Dispense  Refill  . FLUoxetine (PROZAC) 20 MG capsule   Oral   Take 20 mg by mouth daily.         [name]  ondansetron (ZOFRAN) 4 MG tablet   Oral   Take 1 tablet (4 mg total) by mouth every 6 (six) hours as needed for nausea.   20 tablet   0   . oxyCODONE-acetaminophen (PERCOCET/ROXICET) 5-325 MG per tablet   Oral   Take 2 tablets by mouth every 4 (four) hours as needed for pain.   40 tablet   0   . promethazine (PHENERGAN) 12.5 MG tablet   Oral   Take 12.5 mg by mouth every 6 (six) hours as needed for nausea.          [name] senna-docusate (SENOKOT-S) 8.6-50 MG per tablet   Oral   Take 1 tablet by mouth at bedtime.         . sertraline (ZOLOFT) 50 MG tablet   Oral   Take 50 mg by mouth daily.           BP 125/80  Pulse 84  Temp(Src) 97.9 F (36.6 C) (Oral)  Resp 18  SpO2 99%  LMP 03/26/2013  Physical Exam  Nursing note and vitals reviewed. Constitutional: She is oriented to person, place, and time. She appears well-developed and well-nourished. No distress.  HENT:  Head: Normocephalic and atraumatic.  Right Ear: External ear normal.  Left Ear: External ear normal.  Mouth/Throat: Oropharynx is clear and moist. No oropharyngeal exudate.  Eyes: Conjunctivae and EOM are normal. Pupils are equal, round, and reactive to light. Right eye exhibits no discharge. Left eye exhibits no discharge. No scleral icterus.  Neck: Normal range of motion. Neck supple.  Cardiovascular: Normal rate, regular rhythm, normal heart sounds and intact distal pulses.   Pulmonary/Chest: Effort normal and breath sounds normal. No respiratory distress. She has no wheezes. She has no rales.  Abdominal: Soft. There is tenderness (focal in epigastrium and supraumbilical without peritoneal signs).  Musculoskeletal: Normal range of motion. She exhibits no edema.  Lymphadenopathy:    She has no cervical adenopathy.  Neurological: She is alert and oriented to person, place, and time.  Skin: Skin is warm and dry. No rash noted. She is not diaphoretic. No erythema. No pallor.  Mild, resolving bruising around laparoscopic surgical sites; no erythema, swelling, or warmth to touch c/w infection  Psychiatric: She has a normal mood and affect. Her behavior is normal.    ED Course  Procedures (including critical care time)  Labs Reviewed  CBC WITH DIFFERENTIAL - Abnormal; Notable for the following:    MCV 77.1 (*)    MCH 25.4 (*)    All other components within normal limits  LIPASE, BLOOD - Abnormal; Notable for the following:     Lipase 73 (*)    All other components within normal limits  COMPREHENSIVE METABOLIC PANEL - Abnormal; Notable for the following:    Glucose, Bld 100 (*)    AST 82 (*)    ALT 105 (*)    All other components within normal limits  URINALYSIS, ROUTINE W REFLEX MICROSCOPIC - Abnormal; Notable for the following:    Specific Gravity, Urine 1.031 (*)    Hgb urine dipstick MODERATE (*)    All other components within normal limits  URINE MICROSCOPIC-ADD ON - Abnormal; Notable for the following:    Squamous Epithelial / LPF FEW (*)    Bacteria, UA FEW (*)    All other components within normal limits    No results found.   No diagnosis found.   MDM  Patient presents for epigastric pain with nausea, NB/NB emesis, and watery  diarrhea with onset last night. Patient recently had laparoscopic cholecystectomy 10 days ago by Dr. Magnus Ivan for pain; no evidence of cholelithiasis post-op. Physical exam today significant for TTP in epigastric region without peritoneal signs, guarding, or palpable masses. Patient's LFTs today elevated from 1 week ago, 1 day post op. Patient has called Dr. Eliberto Ivory office regarding pain who scheduled outpatient HIDA scan on 05/14/2013 to r/o bile leak. Dr. Norlene Campbell to consult Gen Surg regarding whether to proceed with HIDA scan today or CT abdomen/pelvis.   Gen Surg consulted who recommend HIDA scan. HIDA scan ordered. Patient signed out to [facility], PA-C at shift change for further eval and dispo.   Filed Vitals:   04/25/13 0451  BP: 125/80  Pulse: 84  Temp: 97.9 F (36.6 C)  TempSrc: Oral  Resp: 18  SpO2: 99%          Antony Madura, PA-C 04/25/13 [phone]

## 2013-04-25 NOTE — Telephone Encounter (Signed)
[facility] ED called to give Magnus Ivan MD an FYI that patient was seen in ED today, all tests and notes in Epic.

## 2013-04-25 NOTE — ED Notes (Signed)
Patient presents to ED with c/o generalized abdominal pain with N/V/D, onset last night at around 2300; pt also c/o sore throat, onset 2 days ago.

## 2013-04-25 NOTE — ED Provider Notes (Signed)
Pt states ever since her surgery she has had ongoing postoperative right upper quadrant pain which radiates towards her right shoulder 24 hours a day worse with palpation and deep breathing but she has not actually short of breath or having any chest pain no fever she has had vomiting and diarrhea now in the emergency department she is minimal tenderness to the right upper quadrant without peritonitis her lungs are clear to auscultation unlabored her legs are nontender she has run out of her Percocet and running low on her Phenergan tablets by a scan today showed no leak so she appears stable for outpatient followup with general surgery next week as scheduled; [facility] office notified.  Hurman Horn, MD 05/06/13 [phone]

## 2013-05-03 ENCOUNTER — Encounter (INDEPENDENT_AMBULATORY_CARE_PROVIDER_SITE_OTHER): Payer: Self-pay | Admitting: Surgery

## 2013-05-03 ENCOUNTER — Ambulatory Visit (INDEPENDENT_AMBULATORY_CARE_PROVIDER_SITE_OTHER): Payer: Medicaid Other | Admitting: Surgery

## 2013-05-03 VITALS — BP 120/77 | HR 68 | Temp 97.2°F | Resp 14 | Ht 60.0 in | Wt 130.2 lb

## 2013-05-03 DIAGNOSIS — Z09 Encounter for follow-up examination after completed treatment for conditions other than malignant neoplasm: Secondary | ICD-10-CM

## 2013-05-03 NOTE — Progress Notes (Signed)
Subjective:     Patient ID: Julia [name], female   DOB: [birth year], 27 y.o.   MRN: [id number]  HPI She is here for her first postop visit status post laparoscopic cholecystectomy. She had a rather rocky postoperative course and had to go to the emergency room where a CAT scan was performed which showed no abnormalities with no evidence of bile leak. Her liver function test were also normal. She is doing better except for some mild nausea and shoulder pain.  Review of Systems     Objective:   Physical Exam On exam, she is well her parents. Her abdomen is soft and nontender her incisions are well-healed.   The final pathology of the gallbladder showed Mild chronic cholecystitis with no evidence of gallstones    Assessment:     Patient stable postop    Plan:     I will remove her Zofran. She no longer needs narcotics. Hopefully her symptoms will continue to improve. She may need to see Dr. Dulce Sellar if her symptoms persist

## 2013-05-14 ENCOUNTER — Encounter (HOSPITAL_COMMUNITY): Payer: Medicaid Other

## 2013-05-30 ENCOUNTER — Encounter (HOSPITAL_COMMUNITY)
Admission: RE | Admit: 2013-05-30 | Discharge: 2013-05-30 | Disposition: A | Discharge status: Home / Self Care | Payer: Medicaid Other | Source: Ambulatory Visit | Attending: Surgery | Admitting: Surgery

## 2013-05-30 DIAGNOSIS — R112 Nausea with vomiting, unspecified: Secondary | ICD-10-CM | POA: Insufficient documentation

## 2013-05-30 DIAGNOSIS — Z9049 Acquired absence of other specified parts of digestive tract: Secondary | ICD-10-CM

## 2013-05-30 DIAGNOSIS — Z9089 Acquired absence of other organs: Secondary | ICD-10-CM | POA: Insufficient documentation

## 2013-05-30 DIAGNOSIS — R109 Unspecified abdominal pain: Secondary | ICD-10-CM | POA: Insufficient documentation

## 2013-05-30 DIAGNOSIS — M549 Dorsalgia, unspecified: Secondary | ICD-10-CM

## 2013-05-30 DIAGNOSIS — R7989 Other specified abnormal findings of blood chemistry: Secondary | ICD-10-CM | POA: Insufficient documentation

## 2013-05-30 MED ORDER — TECHNETIUM TC 99M MEBROFENIN IV KIT
5.4000 | PACK | Freq: Once | INTRAVENOUS | Status: AC | PRN
  Administered 2013-05-30: 5 via INTRAVENOUS

## 2013-06-03 ENCOUNTER — Telehealth: Payer: Self-pay | Admitting: *Deleted

## 2013-06-03 NOTE — Telephone Encounter (Signed)
Pt left message requesting refill on Hydrocodone.   Returned call to pt and LMOMTCB x 1.

## 2013-06-06 NOTE — Telephone Encounter (Signed)
Pt is requesting Hydrocodone 5/325 mg refills due to menstrual cramping. Pt states has tried Tramadol w/o relief and made her drowsy. Pt reports last refill was given by Dr. Tamela Oddi due to Dr. Clearance Coots being out of the office at the time.

## 2013-06-06 NOTE — Telephone Encounter (Signed)
Per Dr. Clearance Coots- Offer Mobic 7.5 mg, #60 sig: 1 p.o. Bid prn, 2 refills.   lmomtcb x 1 on [phone].

## 2013-06-07 ENCOUNTER — Encounter: Payer: Self-pay | Admitting: Obstetrics

## 2013-06-07 MED ORDER — MELOXICAM 7.5 MG PO TABS: 7.5000 mg | ORAL_TABLET | Freq: Two times a day (BID) | ORAL | Status: DC | PRN

## 2013-06-07 NOTE — Telephone Encounter (Signed)
Pt aware of Dr. Verdell Carmine recs and is okay with trying Mobic. Pt expressed understanding.

## 2013-08-01 ENCOUNTER — Encounter: Payer: Self-pay | Admitting: Obstetrics

## 2013-08-01 ENCOUNTER — Ambulatory Visit (INDEPENDENT_AMBULATORY_CARE_PROVIDER_SITE_OTHER): Payer: Medicaid Other | Admitting: Obstetrics

## 2013-08-01 VITALS — BP 105/73 | HR 77 | Temp 98.1°F | Ht 60.0 in | Wt 129.6 lb

## 2013-08-01 DIAGNOSIS — R300 Dysuria: Secondary | ICD-10-CM

## 2013-08-01 DIAGNOSIS — N760 Acute vaginitis: Secondary | ICD-10-CM

## 2013-08-01 LAB — POCT URINALYSIS DIPSTICK
Glucose, UA: NEGATIVE
Ketones, UA: NEGATIVE
Leukocytes, UA: NEGATIVE
Spec Grav, UA: 1.025

## 2013-08-01 NOTE — Progress Notes (Signed)
.   Subjective:     Julia [name] is a 27 y.o. female here for a problem exam.  Current complaint: abnormal discharge and burning during urination,.  Personal health questionnaire reviewed: yes.   Gynecologic History No LMP recorded. 07/18/2013 Contraception: none Last Pap: 2013. Results were: normal Last mammogram: N/A  Obstetric History OB History  Gravida Para Term Preterm AB SAB TAB Ectopic Multiple Living  3 2   1 1    2     # Outcome Date GA Lbr Len/2nd Weight Sex Delivery Anes PTL Lv  3 SAB           2 PAR           1 PAR                The following portions of the patient's history were reviewed and updated as appropriate: allergies, current medications, past family history, past medical history, past social history, past surgical history and problem list.  Review of Systems Pertinent items are noted in HPI.    Objective:    General appearance: alert and no distress Abdomen: normal findings: soft, non-tender Pelvic: cervix normal in appearance, external genitalia normal, no adnexal masses or tenderness, no cervical motion tenderness, uterus normal size, shape, and consistency and vagina normal without discharge    Assessment:    Healthy female exam.   H/O vaginitis   Plan:    Follow up in: several months.

## 2013-08-02 LAB — GC/CHLAMYDIA PROBE AMP
CT Probe RNA: NEGATIVE
GC Probe RNA: NEGATIVE

## 2013-08-02 LAB — WET PREP BY MOLECULAR PROBE: Trichomonas vaginosis: NEGATIVE

## 2013-08-03 LAB — URINE CULTURE

## 2013-08-09 ENCOUNTER — Other Ambulatory Visit: Payer: Self-pay | Admitting: *Deleted

## 2013-08-09 DIAGNOSIS — N390 Urinary tract infection, site not specified: Secondary | ICD-10-CM

## 2013-08-09 DIAGNOSIS — B9689 Other specified bacterial agents as the cause of diseases classified elsewhere: Secondary | ICD-10-CM

## 2013-08-09 MED ORDER — NITROFURANTOIN MONOHYD MACRO 100 MG PO CAPS: 100.0000 mg | ORAL_CAPSULE | Freq: Two times a day (BID) | ORAL | Status: DC

## 2013-08-09 MED ORDER — METRONIDAZOLE 500 MG PO TABS: 500.0000 mg | ORAL_TABLET | Freq: Two times a day (BID) | ORAL | Status: DC

## 2013-10-01 ENCOUNTER — Emergency Department (HOSPITAL_COMMUNITY)
Admission: EM | Admit: 2013-10-01 | Discharge: 2013-10-02 | Disposition: A | Discharge status: Home / Self Care | Payer: Medicaid Other | Attending: Emergency Medicine | Admitting: Emergency Medicine

## 2013-10-01 ENCOUNTER — Encounter (HOSPITAL_COMMUNITY): Payer: Self-pay | Admitting: Emergency Medicine

## 2013-10-01 DIAGNOSIS — Z8719 Personal history of other diseases of the digestive system: Secondary | ICD-10-CM | POA: Insufficient documentation

## 2013-10-01 DIAGNOSIS — Z8709 Personal history of other diseases of the respiratory system: Secondary | ICD-10-CM | POA: Insufficient documentation

## 2013-10-01 DIAGNOSIS — N83209 Unspecified ovarian cyst, unspecified side: Secondary | ICD-10-CM

## 2013-10-01 DIAGNOSIS — Z79899 Other long term (current) drug therapy: Secondary | ICD-10-CM | POA: Insufficient documentation

## 2013-10-01 DIAGNOSIS — R1013 Epigastric pain: Secondary | ICD-10-CM | POA: Insufficient documentation

## 2013-10-01 DIAGNOSIS — Z87442 Personal history of urinary calculi: Secondary | ICD-10-CM | POA: Insufficient documentation

## 2013-10-01 DIAGNOSIS — M129 Arthropathy, unspecified: Secondary | ICD-10-CM | POA: Insufficient documentation

## 2013-10-01 DIAGNOSIS — F411 Generalized anxiety disorder: Secondary | ICD-10-CM | POA: Insufficient documentation

## 2013-10-01 DIAGNOSIS — D7389 Other diseases of spleen: Secondary | ICD-10-CM | POA: Insufficient documentation

## 2013-10-01 DIAGNOSIS — R141 Gas pain: Secondary | ICD-10-CM | POA: Insufficient documentation

## 2013-10-01 DIAGNOSIS — Z3202 Encounter for pregnancy test, result negative: Secondary | ICD-10-CM | POA: Insufficient documentation

## 2013-10-01 DIAGNOSIS — F329 Major depressive disorder, single episode, unspecified: Secondary | ICD-10-CM | POA: Insufficient documentation

## 2013-10-01 DIAGNOSIS — D734 Cyst of spleen: Secondary | ICD-10-CM

## 2013-10-01 DIAGNOSIS — G43909 Migraine, unspecified, not intractable, without status migrainosus: Secondary | ICD-10-CM | POA: Insufficient documentation

## 2013-10-01 DIAGNOSIS — Z9889 Other specified postprocedural states: Secondary | ICD-10-CM | POA: Insufficient documentation

## 2013-10-01 DIAGNOSIS — Z9089 Acquired absence of other organs: Secondary | ICD-10-CM | POA: Insufficient documentation

## 2013-10-01 DIAGNOSIS — R142 Eructation: Secondary | ICD-10-CM | POA: Insufficient documentation

## 2013-10-01 DIAGNOSIS — Z8619 Personal history of other infectious and parasitic diseases: Secondary | ICD-10-CM | POA: Insufficient documentation

## 2013-10-01 DIAGNOSIS — F3289 Other specified depressive episodes: Secondary | ICD-10-CM | POA: Insufficient documentation

## 2013-10-01 DIAGNOSIS — R109 Unspecified abdominal pain: Secondary | ICD-10-CM

## 2013-10-01 LAB — URINALYSIS, ROUTINE W REFLEX MICROSCOPIC
Bilirubin Urine: NEGATIVE
Hgb urine dipstick: NEGATIVE
Ketones, ur: NEGATIVE mg/dL
Nitrite: NEGATIVE
Protein, ur: NEGATIVE mg/dL
Urobilinogen, UA: 0.2 mg/dL (ref 0.0–1.0)
pH: 7 (ref 5.0–8.0)

## 2013-10-01 LAB — COMPREHENSIVE METABOLIC PANEL
ALT: 22 U/L (ref 0–35)
Alkaline Phosphatase: 78 U/L (ref 39–117)
BUN: 14 mg/dL (ref 6–23)
CO2: 25 mEq/L (ref 19–32)
Creatinine, Ser: 1.05 mg/dL (ref 0.50–1.10)
GFR calc Af Amer: 82 mL/min — ABNORMAL LOW (ref >90)
GFR calc non Af Amer: 71 mL/min — ABNORMAL LOW (ref >90)
Glucose, Bld: 131 mg/dL — ABNORMAL HIGH (ref 70–99)
Potassium: 4 mEq/L (ref 3.5–5.1)
Sodium: 136 mEq/L (ref 135–145)
Total Bilirubin: 0.2 mg/dL — ABNORMAL LOW (ref 0.3–1.2)
Total Protein: 7.4 g/dL (ref 6.0–8.3)

## 2013-10-01 LAB — CBC WITH DIFFERENTIAL/PLATELET
Eosinophils Absolute: 0 10*3/uL (ref 0.0–0.7)
HCT: 34.9 % — ABNORMAL LOW (ref 36.0–46.0)
Hemoglobin: 11.8 g/dL — ABNORMAL LOW (ref 12.0–15.0)
Lymphocytes Relative: 40 % (ref 12–46)
Lymphs Abs: 3.4 10*3/uL (ref 0.7–4.0)
MCH: 26.9 pg (ref 26.0–34.0)
MCV: 79.5 fL (ref 78.0–100.0)
Monocytes Absolute: 0.7 10*3/uL (ref 0.1–1.0)
Monocytes Relative: 8 % (ref 3–12)
Neutrophils Relative %: 52 % (ref 43–77)
Platelets: 267 10*3/uL (ref 150–400)
RBC: 4.39 MIL/uL (ref 3.87–5.11)
WBC: 8.7 10*3/uL (ref 4.0–10.5)

## 2013-10-01 LAB — URINE MICROSCOPIC-ADD ON

## 2013-10-01 LAB — LIPASE, BLOOD: Lipase: 66 U/L — ABNORMAL HIGH (ref 11–59)

## 2013-10-01 MED ORDER — ONDANSETRON HCL 4 MG/2ML IJ SOLN
4.0000 mg | Freq: Once | INTRAMUSCULAR | Status: AC
  Administered 2013-10-01: 4 mg via INTRAVENOUS
  Filled 2013-10-01: qty 2

## 2013-10-01 MED ORDER — MORPHINE SULFATE 4 MG/ML IJ SOLN
4.0000 mg | Freq: Once | INTRAMUSCULAR | Status: AC
  Administered 2013-10-01: 4 mg via INTRAVENOUS
  Filled 2013-10-01: qty 1

## 2013-10-01 MED ORDER — SODIUM CHLORIDE 0.9 % IV BOLUS (SEPSIS)
1000.0000 mL | Freq: Once | INTRAVENOUS | Status: AC
  Administered 2013-10-01: 1000 mL via INTRAVENOUS

## 2013-10-01 NOTE — ED Provider Notes (Signed)
CSN: [id number]     Arrival date & time 10/01/13  2247 History   First MD Initiated Contact with Patient 10/01/13 2324     Chief Complaint  Patient presents with  . Abdominal Pain  . Nausea   (Consider location/radiation/quality/duration/timing/severity/associated sxs/prior Treatment) HPI Comments: Patient presents to the ED with a chief complaint of upper abdominal pain.  Patient states that the pain began 3 days ago.  She states that it came on suddenly.  She states that she had her gallbladder taken out in May.  She states that she has had persistent nausea since the surgery.  She denies fevers, chills, vomiting, diarrhea, constipation, dysuria, or any other symptoms.  She has not tried taking anything to alleviate her symptoms.  Nothing makes her symptoms better or worse.  The pain is constant and is moderate to severe.  The history is provided by the patient. No language interpreter was used.    Past Medical History  Diagnosis Date  . Chlamydia 2007  . Infection   . Anxiety   . Kidney stones   . GERD (gastroesophageal reflux disease)   . Back pain   . Arthritis   . Depression   . Migraine   . Dyspepsia   . Sinusitis    Past Surgical History  Procedure Laterality Date  . Dilation and curettage of uterus    . Colonoscopy  2012  . Upper gastrointestinal endoscopy  2012  . Cholecystectomy N/A 04/15/2013    Procedure: LAPAROSCOPIC CHOLECYSTECTOMY;  Surgeon: Shelly Rubenstein, MD;  Location: [facility] OR;  Service: General;  Laterality: N/A;   Family History  Problem Relation Age of Onset  . Anesthesia problems Neg Hx   . Diabetes type II Mother   . Hypertension Mother   . Diabetes Mother   . Hyperlipidemia Father   . Hypertension Father    History  Substance Use Topics  . Smoking status: Never Smoker   . Smokeless tobacco: Never Used  . Alcohol Use: No   OB History   Grav Para Term Preterm Abortions TAB SAB Ect Mult Living   3 2   1  1   2      Review of Systems   Gastrointestinal: Positive for abdominal distention.  All other systems reviewed and are negative.    Allergies  Review of patient's allergies indicates no known allergies.  Home Medications   Current Outpatient Rx  Name  Route  Sig  Dispense  Refill  . amitriptyline (ELAVIL) 25 MG tablet   Oral   Take 25 mg by mouth at bedtime.         [name] FLUoxetine (PROZAC) 20 MG capsule   Oral   Take 20 mg by mouth daily.         . meloxicam (MOBIC) 7.5 MG tablet   Oral   Take 1 tablet (7.5 mg total) by mouth 2 (two) times daily as needed for pain.   60 tablet   2   . metroNIDAZOLE (FLAGYL) 500 MG tablet   Oral   Take 1 tablet (500 mg total) by mouth 2 (two) times daily.   14 tablet   0   . nitrofurantoin, macrocrystal-monohydrate, (MACROBID) 100 MG capsule   Oral   Take 1 capsule (100 mg total) by mouth 2 (two) times daily. 1 po BID x 7days   14 capsule   0   . ondansetron (ZOFRAN) 4 MG tablet   Oral   Take 1 tablet (4 mg total) by  mouth every 6 (six) hours as needed for nausea.   20 tablet   0   . oxyCODONE-acetaminophen (PERCOCET/ROXICET) 5-325 MG per tablet   Oral   Take 2 tablets by mouth every 4 (four) hours as needed for pain.   40 tablet   0   . promethazine (PHENERGAN) 12.5 MG tablet   Oral   Take 12.5 mg by mouth every 6 (six) hours as needed for nausea.         . promethazine (PHENERGAN) 25 MG tablet   Oral   Take 1 tablet (25 mg total) by mouth every 6 (six) hours as needed for nausea.   12 tablet   0   . senna-docusate (SENOKOT-S) 8.6-50 MG per tablet   Oral   Take 1 tablet by mouth at bedtime.         . sertraline (ZOLOFT) 50 MG tablet   Oral   Take 50 mg by mouth daily.          BP 134/87  Pulse 94  Temp(Src) 98.7 F (37.1 C) (Oral)  Resp 16  SpO2 99%  LMP 09/17/2013 Physical Exam  Nursing note and vitals reviewed. Constitutional: She is oriented to person, place, and time. She appears well-developed and well-nourished.  HENT:   Head: Normocephalic and atraumatic.  Eyes: Conjunctivae and EOM are normal. Pupils are equal, round, and reactive to light.  Neck: Normal range of motion. Neck supple.  Cardiovascular: Normal rate and regular rhythm.  Exam reveals no gallop and no friction rub.   No murmur heard. Pulmonary/Chest: Effort normal and breath sounds normal. No respiratory distress. She has no wheezes. She has no rales. She exhibits no tenderness.  Abdominal: Soft. Bowel sounds are normal. She exhibits no mass. There is tenderness. There is no rebound and no guarding.  Epigastric abdominal tenderness, no other focal abdominal tenderness, no signs of surgical or acute abdomen.  Musculoskeletal: Normal range of motion. She exhibits no edema and no tenderness.  Neurological: She is alert and oriented to person, place, and time.  Skin: Skin is warm and dry.  Psychiatric: She has a normal mood and affect. Her behavior is normal. Judgment and thought content normal.    ED Course  Procedures (including critical care time) Labs Review Labs Reviewed  CBC WITH DIFFERENTIAL - Abnormal; Notable for the following:    Hemoglobin 11.8 (*)    HCT 34.9 (*)    All other components within normal limits  COMPREHENSIVE METABOLIC PANEL  LIPASE, BLOOD  URINALYSIS, ROUTINE W REFLEX MICROSCOPIC   Imaging Review No results found.  EKG Interpretation   None       MDM  No diagnosis found.  Patient with abdominal pain.  The pain is in the upper abdomen.  Recent surgery in may.  No alcohol use.  Will check basic labs and re-evaluate.  Will treat pain.  Consider CT.  The patient states that she has had some new abdominal distension.  Patient has elevated lipase.  CT is pending.  Patient signed out to [facility], who will continue care.  Plan: disposition pending CT.      Roxy Horseman, PA-C 10/02/13 [phone]

## 2013-10-01 NOTE — ED Notes (Signed)
Pt reports she has upper abdominal pain x3 days with nausea, no vomiting.

## 2013-10-02 ENCOUNTER — Emergency Department (HOSPITAL_COMMUNITY): Payer: Medicaid Other

## 2013-10-02 ENCOUNTER — Encounter (HOSPITAL_COMMUNITY): Payer: Self-pay

## 2013-10-02 MED ORDER — HYDROCODONE-ACETAMINOPHEN 5-325 MG PO TABS: 1.0000 | ORAL_TABLET | ORAL | Status: DC | PRN

## 2013-10-02 MED ORDER — HYDROMORPHONE HCL PF 1 MG/ML IJ SOLN
1.0000 mg | Freq: Once | INTRAMUSCULAR | Status: AC
  Administered 2013-10-02: 1 mg via INTRAVENOUS
  Filled 2013-10-02: qty 1

## 2013-10-02 MED ORDER — IOHEXOL 300 MG/ML  SOLN
50.0000 mL | Freq: Once | INTRAMUSCULAR | Status: AC | PRN
  Administered 2013-10-02: 50 mL via ORAL

## 2013-10-02 MED ORDER — OMEPRAZOLE 20 MG PO CPDR: 20.0000 mg | DELAYED_RELEASE_CAPSULE | Freq: Every day | ORAL | Status: DC

## 2013-10-02 MED ORDER — IOHEXOL 300 MG/ML  SOLN
100.0000 mL | Freq: Once | INTRAMUSCULAR | Status: AC | PRN
  Administered 2013-10-02: 100 mL via INTRAVENOUS

## 2013-10-02 MED ORDER — FAMOTIDINE 20 MG PO TABS: 20.0000 mg | ORAL_TABLET | Freq: Two times a day (BID) | ORAL | Status: DC

## 2013-10-02 MED ORDER — MORPHINE SULFATE 4 MG/ML IJ SOLN
4.0000 mg | Freq: Once | INTRAMUSCULAR | Status: AC
  Administered 2013-10-02: 4 mg via INTRAVENOUS
  Filled 2013-10-02: qty 1

## 2013-10-02 MED ORDER — ONDANSETRON 8 MG PO TBDP: ORAL_TABLET | ORAL | Status: DC

## 2013-10-02 NOTE — ED Provider Notes (Signed)
Medical screening examination/treatment/procedure(s) were performed by non-physician practitioner and as supervising physician I was immediately available for consultation/collaboration.    Rance Smithson, MD 10/02/13 0504 

## 2013-10-02 NOTE — ED Provider Notes (Signed)
Medical screening examination/treatment/procedure(s) were performed by non-physician practitioner and as supervising physician I was immediately available for consultation/collaboration.    Kiyra Slaubaugh, MD 10/02/13 0504 

## 2013-10-02 NOTE — ED Provider Notes (Signed)
Julia [name] S 1:00 AM patient discussed in sign out. Patient awaiting CT scan for further evaluations of her upper abdominal pain and discomfort.   Labs unremarkable aside from mild elevation of lipase. No inflammation of the pancreas on CT. Patient was stable cyst of the spleen. There is also some evidence of right ovarian cyst. Patient's pains are in the epigastric and upper abdomen.[name] at this time we discussed possibilities of gastritis, gastric ulcers, pancreatitis, IBS and muscular causes of her pain. I have recommended that the patient begin using an acid medications and will also provide referral for GI. Patient also instructed to have a simple clear liquid diet for the next few days to help with symptoms. She agrees with plan.          Results for orders placed during the hospital encounter of 10/01/13  CBC WITH DIFFERENTIAL      Result Value Range   WBC 8.7  4.0 - 10.5 K/uL   RBC 4.39  3.87 - 5.11 MIL/uL   Hemoglobin 11.8 (*) 12.0 - 15.0 g/dL   HCT 16.1 (*) 09.6 - 04.5 %   MCV 79.5  78.0 - 100.0 fL   MCH 26.9  26.0 - 34.0 pg   MCHC 33.8  30.0 - 36.0 g/dL   RDW 40.9  81.1 - 91.4 %   Platelets 267  150 - 400 K/uL   Neutrophils Relative % 52  43 - 77 %   Neutro Abs 4.5  1.7 - 7.7 K/uL   Lymphocytes Relative 40  12 - 46 %   Lymphs Abs 3.4  0.7 - 4.0 K/uL   Monocytes Relative 8  3 - 12 %   Monocytes Absolute 0.7  0.1 - 1.0 K/uL   Eosinophils Relative 1  0 - 5 %   Eosinophils Absolute 0.0  0.0 - 0.7 K/uL   Basophils Relative 0  0 - 1 %   Basophils Absolute 0.0  0.0 - 0.1 K/uL  COMPREHENSIVE METABOLIC PANEL      Result Value Range   Sodium 136  135 - 145 mEq/L   Potassium 4.0  3.5 - 5.1 mEq/L   Chloride 103  96 - 112 mEq/L   CO2 25  19 - 32 mEq/L   Glucose, Bld 131 (*) 70 - 99 mg/dL   BUN 14  6 - 23 mg/dL   Creatinine, Ser 7.82  0.50 - 1.10 mg/dL   Calcium 9.6  8.4 - 95.6 mg/dL   Total Protein 7.4  6.0 - 8.3 g/dL   Albumin 4.1  3.5 - 5.2 g/dL   AST 21  0 - 37 U/L   ALT 22  0 - 35 U/L   Alkaline Phosphatase 78  39 - 117 U/L   Total Bilirubin 0.2 (*) 0.3 - 1.2 mg/dL   GFR calc non Af Amer 71 (*) >90 mL/min   GFR calc Af Amer 82 (*) >90 mL/min  LIPASE, BLOOD      Result Value Range   Lipase 66 (*) 11 - 59 U/L  URINALYSIS, ROUTINE W REFLEX MICROSCOPIC      Result Value Range   Color, Urine YELLOW  YELLOW   APPearance CLOUDY (*) CLEAR   Specific Gravity, Urine 1.024  1.005 - 1.030   pH 7.0  5.0 - 8.0   Glucose, UA NEGATIVE  NEGATIVE mg/dL   Hgb urine dipstick NEGATIVE  NEGATIVE   Bilirubin Urine NEGATIVE  NEGATIVE   Ketones, ur NEGATIVE  NEGATIVE mg/dL   Protein,  ur NEGATIVE  NEGATIVE mg/dL   Urobilinogen, UA 0.2  0.0 - 1.0 mg/dL   Nitrite NEGATIVE  NEGATIVE   Leukocytes, UA SMALL (*) NEGATIVE  URINE MICROSCOPIC-ADD ON      Result Value Range   Squamous Epithelial / LPF FEW (*) RARE   WBC, UA 0-2  <3 WBC/hpf   Bacteria, UA FEW (*) RARE   Urine-Other AMORPHOUS URATES/PHOSPHATES    POCT PREGNANCY, URINE      Result Value Range   Preg Test, Ur NEGATIVE  NEGATIVE   Ct Abdomen Pelvis W Contrast  10/02/2013   CLINICAL DATA:  Upper abdominal pain and nausea for 3 days. White cells in the urine. White cell count 8.7. Elevated lipase of 66.  EXAM: CT ABDOMEN AND PELVIS WITH CONTRAST  TECHNIQUE: Multidetector CT imaging of the abdomen and pelvis was performed using the standard protocol following bolus administration of intravenous contrast.  CONTRAST:  OMNIPAQUE IOHEXOL 300 MG/ML  SOLN  COMPARISON:  01/22/2013  FINDINGS: The lung bases are clear.  Surgical absence of the gallbladder. Septated low attenuation lesion in the spleen measuring 11 mm diameter, likely representing a cyst. This is stable since previous study. The liver, pancreas, adrenal glands, abdominal aorta, inferior vena cava, and retroperitoneal lymph nodes are unremarkable. Two stones in the lower pole of the left kidney, largest measuring 5 mm diameter. No ureteral stones or  pyelocaliectasis. The stomach, small bowel, and colon are normal for degree of distention. No free air or free fluid in the abdomen. Abdominal wall musculature appears intact.  Pelvis: Uterus is not enlarged. There appears to be involuting cyst in the right ovary. Small amount of free fluid in the pelvis is likely physiologic. Bladder wall is not thickened. No loculated pelvic fluid collections. The appendix is normal. No evidence of diverticulitis. No significant pelvic lymphadenopathy. Normal alignment of the lumbar spine.  IMPRESSION: Stable appearance of probable cyst in the spleen and nonobstructing left lower pole renal calculi since previous study. Probable involuting cyst in the right ovary with small amount of fluid in the pelvis likely physiologic. No acute process demonstrated in the abdomen or pelvis.   Electronically Signed   By: Burman Nieves M.D.   On: 10/02/2013 01:40      Angus Seller, PA-C 10/02/13 [phone]

## 2013-10-08 ENCOUNTER — Other Ambulatory Visit (HOSPITAL_COMMUNITY): Payer: Self-pay | Admitting: Gastroenterology

## 2013-10-08 DIAGNOSIS — R112 Nausea with vomiting, unspecified: Secondary | ICD-10-CM

## 2013-10-16 ENCOUNTER — Ambulatory Visit (HOSPITAL_COMMUNITY)
Admission: RE | Admit: 2013-10-16 | Discharge: 2013-10-16 | Disposition: A | Discharge status: Home / Self Care | Payer: Medicaid Other | Source: Ambulatory Visit | Attending: Gastroenterology | Admitting: Gastroenterology

## 2013-10-16 DIAGNOSIS — R112 Nausea with vomiting, unspecified: Secondary | ICD-10-CM | POA: Insufficient documentation

## 2013-10-16 MED ORDER — TECHNETIUM TC 99M SULFUR COLLOID
2.2000 | Freq: Once | INTRAVENOUS | Status: AC | PRN
  Administered 2013-10-16: 2.2 via INTRAVENOUS

## 2013-10-17 ENCOUNTER — Other Ambulatory Visit: Payer: Self-pay

## 2013-11-25 ENCOUNTER — Encounter: Payer: Self-pay | Admitting: Obstetrics

## 2014-01-21 ENCOUNTER — Ambulatory Visit (INDEPENDENT_AMBULATORY_CARE_PROVIDER_SITE_OTHER): Payer: Medicaid Other | Admitting: Obstetrics

## 2014-01-21 ENCOUNTER — Encounter: Payer: Self-pay | Admitting: Obstetrics

## 2014-01-21 VITALS — BP 116/75 | HR 90 | Temp 98.1°F | Wt 136.0 lb

## 2014-01-21 DIAGNOSIS — Z124 Encounter for screening for malignant neoplasm of cervix: Secondary | ICD-10-CM

## 2014-01-21 DIAGNOSIS — Z113 Encounter for screening for infections with a predominantly sexual mode of transmission: Secondary | ICD-10-CM

## 2014-01-21 DIAGNOSIS — Z0000 Encounter for general adult medical examination without abnormal findings: Secondary | ICD-10-CM

## 2014-01-21 DIAGNOSIS — Z3202 Encounter for pregnancy test, result negative: Secondary | ICD-10-CM

## 2014-01-21 DIAGNOSIS — R102 Pelvic and perineal pain: Secondary | ICD-10-CM | POA: Insufficient documentation

## 2014-01-21 LAB — POCT URINE PREGNANCY: Preg Test, Ur: NEGATIVE

## 2014-01-21 LAB — POCT URINALYSIS DIPSTICK
Bilirubin, UA: NEGATIVE
Glucose, UA: NEGATIVE
Ketones, UA: NEGATIVE
Leukocytes, UA: NEGATIVE
Nitrite, UA: NEGATIVE
PROTEIN UA: NEGATIVE
RBC UA: NEGATIVE
SPEC GRAV UA: 1.015
UROBILINOGEN UA: NEGATIVE
pH, UA: 8

## 2014-01-21 NOTE — Progress Notes (Signed)
Subjective:     Julia [name] is a 28 y.o. female here for a routine exam.  Current complaints: pt states she has a change in d/c,mucousy.  Pt states that has pain in her lower left pelvic area.  Pt states that pain goes midline abdomen.  Pt states that the pain comes and goes.  Pt denies pain radiating.  Pt states that she does have some vaginal itchiness.  Pt states that her breast are more tender and sensitive.  Pt has noticed these symptoms for the past couple months.  Pt denies any other urinary symptoms.  Personal health questionnaire reviewed: yes.   Gynecologic History Patient's last menstrual period was 01/04/2014. Contraception: none Last Pap: 2 years ago. Results were: normal Last mammogram: n/a  Obstetric History OB History  Gravida Para Term Preterm AB SAB TAB Ectopic Multiple Living  3 2   1 1    2     # Outcome Date GA Lbr Len/2nd Weight Sex Delivery Anes PTL Lv  3 SAB           2 PAR           1 PAR                The following portions of the patient's history were reviewed and updated as appropriate: allergies, current medications, past family history, past medical history, past social history, past surgical history and problem list.  Review of Systems Pertinent items are noted in HPI.    Objective:    General appearance: alert and no distress Breasts: normal appearance, no masses or tenderness Abdomen: normal findings: soft, non-tender Pelvic: cervix normal in appearance, external genitalia normal, no adnexal masses or tenderness, no cervical motion tenderness, rectovaginal septum normal, uterus normal size, shape, and consistency and vagina normal without discharge    Assessment:    Healthy female exam.  Annual.  Pelvic pain.  Do not find a Gyn source of pain.   Plan:    Follow up in: 1 year.   Annual exam.

## 2014-01-22 ENCOUNTER — Other Ambulatory Visit: Payer: Self-pay | Admitting: *Deleted

## 2014-01-22 DIAGNOSIS — N760 Acute vaginitis: Principal | ICD-10-CM

## 2014-01-22 DIAGNOSIS — B9689 Other specified bacterial agents as the cause of diseases classified elsewhere: Secondary | ICD-10-CM

## 2014-01-22 LAB — WET PREP BY MOLECULAR PROBE
Candida species: NEGATIVE
Gardnerella vaginalis: POSITIVE — AB
TRICHOMONAS VAG: NEGATIVE

## 2014-01-22 LAB — PAP IG W/ RFLX HPV ASCU

## 2014-01-22 LAB — GC/CHLAMYDIA PROBE AMP
CT PROBE, AMP APTIMA: NEGATIVE
GC Probe RNA: NEGATIVE

## 2014-01-22 MED ORDER — METRONIDAZOLE 500 MG PO TABS: 500.0000 mg | ORAL_TABLET | Freq: Two times a day (BID) | ORAL | Status: DC

## 2014-03-26 ENCOUNTER — Encounter (HOSPITAL_COMMUNITY): Payer: Self-pay | Admitting: *Deleted

## 2014-03-28 ENCOUNTER — Encounter (HOSPITAL_COMMUNITY): Payer: Self-pay | Admitting: Pharmacy Technician

## 2014-03-31 ENCOUNTER — Other Ambulatory Visit: Payer: Self-pay | Admitting: Gastroenterology

## 2014-03-31 NOTE — Addendum Note (Signed)
Addended by: Dangelo Guzzetta on: 03/31/2014 05:26 PM   Modules accepted: Orders  

## 2014-04-02 ENCOUNTER — Encounter (HOSPITAL_COMMUNITY): Payer: Medicaid Other | Admitting: Anesthesiology

## 2014-04-02 ENCOUNTER — Encounter (HOSPITAL_COMMUNITY)
Admission: RE | Disposition: A | Discharge status: Home / Self Care | Payer: Self-pay | Source: Ambulatory Visit | Attending: Gastroenterology

## 2014-04-02 ENCOUNTER — Ambulatory Visit (HOSPITAL_COMMUNITY)
Admission: RE | Admit: 2014-04-02 | Discharge: 2014-04-02 | Disposition: A | Discharge status: Home / Self Care | Payer: Medicaid Other | Source: Ambulatory Visit | Attending: Gastroenterology | Admitting: Gastroenterology

## 2014-04-02 ENCOUNTER — Encounter (HOSPITAL_COMMUNITY): Payer: Self-pay | Admitting: *Deleted

## 2014-04-02 ENCOUNTER — Ambulatory Visit (HOSPITAL_COMMUNITY): Payer: Medicaid Other | Admitting: Anesthesiology

## 2014-04-02 DIAGNOSIS — N289 Disorder of kidney and ureter, unspecified: Secondary | ICD-10-CM | POA: Insufficient documentation

## 2014-04-02 DIAGNOSIS — F329 Major depressive disorder, single episode, unspecified: Secondary | ICD-10-CM | POA: Insufficient documentation

## 2014-04-02 DIAGNOSIS — F411 Generalized anxiety disorder: Secondary | ICD-10-CM | POA: Insufficient documentation

## 2014-04-02 DIAGNOSIS — F3289 Other specified depressive episodes: Secondary | ICD-10-CM | POA: Insufficient documentation

## 2014-04-02 DIAGNOSIS — Z9089 Acquired absence of other organs: Secondary | ICD-10-CM | POA: Insufficient documentation

## 2014-04-02 DIAGNOSIS — R112 Nausea with vomiting, unspecified: Secondary | ICD-10-CM | POA: Insufficient documentation

## 2014-04-02 DIAGNOSIS — R109 Unspecified abdominal pain: Secondary | ICD-10-CM | POA: Insufficient documentation

## 2014-04-02 DIAGNOSIS — K219 Gastro-esophageal reflux disease without esophagitis: Secondary | ICD-10-CM | POA: Insufficient documentation

## 2014-04-02 DIAGNOSIS — D649 Anemia, unspecified: Secondary | ICD-10-CM | POA: Insufficient documentation

## 2014-04-02 HISTORY — PX: ESOPHAGOGASTRODUODENOSCOPY (EGD) WITH PROPOFOL: SHX5813

## 2014-04-02 HISTORY — PX: EUS: SHX5427

## 2014-04-02 LAB — PREGNANCY, URINE: Preg Test, Ur: NEGATIVE

## 2014-04-02 SURGERY — ESOPHAGOGASTRODUODENOSCOPY (EGD) WITH PROPOFOL
Anesthesia: Monitor Anesthesia Care

## 2014-04-02 MED ORDER — KETAMINE HCL 10 MG/ML IJ SOLN
INTRAMUSCULAR | Status: DC | PRN
  Administered 2014-04-02: 20 mg via INTRAVENOUS
  Administered 2014-04-02: 10 mg via INTRAVENOUS
  Administered 2014-04-02: 20 mg via INTRAVENOUS

## 2014-04-02 MED ORDER — BUTAMBEN-TETRACAINE-BENZOCAINE 2-2-14 % EX AERO
INHALATION_SPRAY | CUTANEOUS | Status: DC | PRN
  Administered 2014-04-02: 2 via TOPICAL

## 2014-04-02 MED ORDER — LACTATED RINGERS IV SOLN
INTRAVENOUS | Status: DC
  Administered 2014-04-02: 09:00:00 via INTRAVENOUS

## 2014-04-02 MED ORDER — GLYCOPYRROLATE 0.2 MG/ML IJ SOLN
INTRAMUSCULAR | Status: DC | PRN
  Administered 2014-04-02 (×2): .02 mg via INTRAVENOUS

## 2014-04-02 MED ORDER — ONDANSETRON HCL 4 MG/2ML IJ SOLN
INTRAMUSCULAR | Status: AC
  Filled 2014-04-02: qty 2

## 2014-04-02 MED ORDER — PROPOFOL 10 MG/ML IV BOLUS
INTRAVENOUS | Status: AC
  Filled 2014-04-02: qty 20

## 2014-04-02 MED ORDER — GLYCOPYRROLATE 0.2 MG/ML IJ SOLN
INTRAMUSCULAR | Status: AC
  Filled 2014-04-02: qty 1

## 2014-04-02 MED ORDER — SODIUM CHLORIDE 0.9 % IV SOLN: INTRAVENOUS | Status: DC

## 2014-04-02 MED ORDER — SODIUM CHLORIDE 0.9 % IJ SOLN
INTRAMUSCULAR | Status: AC
  Filled 2014-04-02: qty 10

## 2014-04-02 MED ORDER — PROMETHAZINE HCL 25 MG/ML IJ SOLN
12.5000 mg | Freq: Once | INTRAMUSCULAR | Status: AC
  Administered 2014-04-02: 12.5 mg via INTRAVENOUS

## 2014-04-02 MED ORDER — MIDAZOLAM HCL 2 MG/2ML IJ SOLN
INTRAMUSCULAR | Status: AC
  Filled 2014-04-02: qty 2

## 2014-04-02 MED ORDER — PROPOFOL INFUSION 10 MG/ML OPTIME
INTRAVENOUS | Status: DC | PRN
  Administered 2014-04-02: 300 ug/kg/min via INTRAVENOUS

## 2014-04-02 MED ORDER — DEXAMETHASONE SODIUM PHOSPHATE 10 MG/ML IJ SOLN
INTRAMUSCULAR | Status: AC
  Filled 2014-04-02: qty 1

## 2014-04-02 MED ORDER — ONDANSETRON HCL 4 MG/2ML IJ SOLN
INTRAMUSCULAR | Status: DC | PRN
  Administered 2014-04-02: 4 mg via INTRAVENOUS

## 2014-04-02 MED ORDER — METOCLOPRAMIDE HCL 5 MG/ML IJ SOLN
INTRAMUSCULAR | Status: DC | PRN
  Administered 2014-04-02: 10 mg via INTRAVENOUS

## 2014-04-02 MED ORDER — PROMETHAZINE HCL 25 MG/ML IJ SOLN
INTRAMUSCULAR | Status: AC
  Filled 2014-04-02: qty 1

## 2014-04-02 MED ORDER — PROPOFOL 10 MG/ML IV BOLUS
INTRAVENOUS | Status: DC | PRN
  Administered 2014-04-02: 20 mg via INTRAVENOUS
  Administered 2014-04-02: 10 mg via INTRAVENOUS

## 2014-04-02 SURGICAL SUPPLY — 15 items

## 2014-04-02 NOTE — Transfer of Care (Signed)
Immediate Anesthesia Transfer of Care Note  Patient: Byrd HesselbachMaria ArguelloMartine  Procedure(s) Performed: Procedure(s) (LRB): ESOPHAGOGASTRODUODENOSCOPY (EGD) WITH PROPOFOL (N/A) ESOPHAGEAL ENDOSCOPIC ULTRASOUND (EUS) RADIAL (N/A)  Patient Location: PACU  Anesthesia Type: MAC  Level of Consciousness: sedated, patient cooperative and responds to stimulation  Airway & Oxygen Therapy: Patient Spontanous Breathing and Patient connected to face mask oxgen  Post-op Assessment: Report given to PACU RN and Post -op Vital signs reviewed and stable  Post vital signs: Reviewed and stable  Complications: No apparent anesthesia complications

## 2014-04-02 NOTE — Anesthesia Postprocedure Evaluation (Signed)
Anesthesia Post Note  Patient: Julia HesselbachMaria ArguelloMartine  Procedure(s) Performed: Procedure(s) (LRB): ESOPHAGOGASTRODUODENOSCOPY (EGD) WITH PROPOFOL (N/A) ESOPHAGEAL ENDOSCOPIC ULTRASOUND (EUS) RADIAL (N/A)  Anesthesia type: MAC  Patient location: PACU  Post pain: Pain level controlled  Post assessment: Post-op Vital signs reviewed  Last Vitals: BP 123/84  Pulse 76  Temp(Src) 36.6 C (Oral)  Resp 18  Ht 5' (1.524 m)  Wt 131 lb (59.421 kg)  BMI 25.58 kg/m2  SpO2 100%  LMP 02/26/2014  Post vital signs: Reviewed  Level of consciousness: awake  Complications: No apparent anesthesia complications

## 2014-04-02 NOTE — Anesthesia Preprocedure Evaluation (Signed)
Anesthesia Evaluation  Patient identified by MRN, date of birth, ID band Patient awake    Reviewed: Allergy & Precautions, H&P , NPO status , Patient's Chart, lab work & pertinent test results  Airway Mallampati: I      Dental  (+) Teeth Intact, Dental Advisory Given   Pulmonary neg pulmonary ROS,    Pulmonary exam normal       Cardiovascular negative cardio ROS      Neuro/Psych  Headaches, PSYCHIATRIC DISORDERS Anxiety Depression    GI/Hepatic Neg liver ROS, GERD-  Medicated,  Endo/Other  negative endocrine ROS  Renal/GU Renal disease     Musculoskeletal   Abdominal   Peds  Hematology  (+) anemia ,   Anesthesia Other Findings   Reproductive/Obstetrics negative OB ROS                           Anesthesia Physical  Anesthesia Plan  ASA: II  Anesthesia Plan: MAC   Post-op Pain Management:    Induction: Intravenous  Airway Management Planned:   Additional Equipment:   Intra-op Plan:   Post-operative Plan:   Informed Consent: I have reviewed the patients History and Physical, chart, labs and discussed the procedure including the risks, benefits and alternatives for the proposed anesthesia with the patient or authorized representative who has indicated his/her understanding and acceptance.   Dental advisory given  Plan Discussed with: CRNA  Anesthesia Plan Comments:         Anesthesia Quick Evaluation

## 2014-04-02 NOTE — Discharge Instructions (Signed)
Endoscopy °Care After °Please read the instructions outlined below and refer to this sheet in the next few weeks. These discharge instructions provide you with general information on caring for yourself after you leave the hospital. Your doctor may also give you specific instructions. While your treatment has been planned according to the most current medical practices available, unavoidable complications occasionally occur. If you have any problems or questions after discharge, please call Dr. Reuben Knoblock ([facility]) at [phone]. ° °HOME CARE INSTRUCTIONS °Activity °· You may resume your regular activity but move at a slower pace for the next 24 hours.  °· Take frequent rest periods for the next 24 hours.  °· Walking will help expel (get rid of) the air and reduce the bloated feeling in your abdomen.  °· No driving for 24 hours (because of the anesthesia (medicine) used during the test).  °· You may shower.  °· Do not sign any important legal documents or operate any machinery for 24 hours (because of the anesthesia used during the test).  °Nutrition °· Drink plenty of fluids.  °· You may resume your normal diet.  °· Begin with a light meal and progress to your normal diet.  °· Avoid alcoholic beverages for 24 hours or as instructed by your caregiver.  °Medications °You may resume your normal medications unless your caregiver tells you otherwise. °What you can expect today °· You may experience abdominal discomfort such as a feeling of fullness or "gas" pains.  °· You may experience a sore throat for 2 to 3 days. This is normal. Gargling with salt water may help this.  °·  °SEEK IMMEDIATE MEDICAL CARE IF: °· You have excessive nausea (feeling sick to your stomach) and/or vomiting.  °· You have severe abdominal pain and distention (swelling).  °· You have trouble swallowing.  °· You have a temperature over 100° F (37.8° C).  °· You have rectal bleeding or vomiting of blood.  °Document Released:  07/12/2004 Document Revised: 08/10/2011 Document Reviewed: 01/23/2008 °[employer]® Patient Information ©2012 [employer]. °

## 2014-04-02 NOTE — Op Note (Signed)
[facility] [address] [place], [id number]   ENDOSCOPIC ULTRASOUND PROCEDURE REPORT  PATIENT: Julia [name], Julia  MR#: [id number] BIRTHDATE: 1986-11-18  GENDER: Female ENDOSCOPIST: Willis ModenaWilliam Janelie Goltz, MD REFERRED BY: PROCEDURE DATE:  04/02/2014 PROCEDURE:   EGD with biopsies; Upper EUS ASA CLASS:      Class II INDICATIONS:   1.  upper abdominal pain, nausea, vomiting. MEDICATIONS: MAC sedation, administered by CRNA and Cetacaine spray x 2  DESCRIPTION OF PROCEDURE:   After the risks benefits and alternatives of the procedure were  explained, informed consent was obtained. The patient was then placed in the left, lateral, decubitus postion and IV sedation was administered. Throughout the procedure, the patients blood pressure, pulse and oxygen saturations were monitored continuously.  Under direct visualization, the     endoscope was introduced through the mouth and advanced to the second portion of the duodenum .  Water was used as necessary to provide an acoustic interface.  Upon completion of the imaging, water was removed and the patient was sent to the recovery room in satisfactory condition.    FINDINGS:      EGD:  Irregular Z-line, suspect anatomic variant, biopsied with cold forceps.  Could proximal esophageal gastric inlet patches.  Otherwise normal endoscopy to the second portion of the duodenum. EUS:  Bile duct non-dilated and otherwise normal; no bile duct wall thickening, mass, choledocholithiasis.  Pancreas with scattered hyperechoic strands and foci, otherwise normal; no pancreatic mass, cyst, pancreatic ductal dilatation, visible pancreatic ductal side branches, peripancreatic adenopathy.  Post-cholecystectomy. Patient does not meet criteria for chronic pancreatitis.  IMPRESSION:     As above.  RECOMMENDATIONS:     1.  Watch for potential complications of procedure. 2.  Await biopsy results. 3.  Trial of Dexilant 60 mg po qd; patient  can obtain samples from our office; patient has not improved with prior trials of omeprazole and famotidine. 4.  Conservative antireflux measures. 5.  If no improvement with Dexilant, consider trial of antispasmodics; if no improvement with antispasmodics, consider trial of pancreatic enzymes. 5.  If no improvement with Dexialnt   _______________________________ Rosalie DoctoreSignedWillis Modena:  Lawrencia Mauney, MD 04/02/2014 11:21 AM CC:

## 2014-04-02 NOTE — H&P (Signed)
Patient interval history reviewed.  Patient examined again.  There has been no change from documented H/P dated 03/25/14 (scanned into chart from our office) except as documented above.  Assessment:  1.  Upper abdominal pain. 2.  Nausea and vomiting.  Plan:  1.  Endoscopy. 2.  Endoscopic ultrasound. 3.  Risks (bleeding, infection, bowel perforation that could require surgery, sedation-related changes in cardiopulmonary systems), benefits (identification and possible treatment of source of symptoms, exclusion of certain causes of symptoms), and alternatives (watchful waiting, radiographic imaging studies, empiric medical treatment) of upper endoscopy as well as upper endoscopic ultrasound (EGD + EUS) were explained to patient/family in detail and patient wishes to proceed.

## 2014-04-03 ENCOUNTER — Encounter (HOSPITAL_COMMUNITY): Payer: Self-pay | Admitting: Gastroenterology

## 2014-08-28 ENCOUNTER — Ambulatory Visit (INDEPENDENT_AMBULATORY_CARE_PROVIDER_SITE_OTHER): Payer: Medicaid Other | Admitting: Neurology

## 2014-08-28 ENCOUNTER — Encounter: Payer: Self-pay | Admitting: Neurology

## 2014-08-28 VITALS — BP 100/62 | HR 78 | Temp 98.2°F | Ht 60.0 in | Wt 136.0 lb

## 2014-08-28 DIAGNOSIS — R531 Weakness: Secondary | ICD-10-CM

## 2014-08-28 DIAGNOSIS — R519 Headache, unspecified: Secondary | ICD-10-CM

## 2014-08-28 DIAGNOSIS — E663 Overweight: Secondary | ICD-10-CM

## 2014-08-28 DIAGNOSIS — R51 Headache: Secondary | ICD-10-CM

## 2014-08-28 DIAGNOSIS — M6281 Muscle weakness (generalized): Secondary | ICD-10-CM

## 2014-08-28 MED ORDER — NORTRIPTYLINE HCL 10 MG PO CAPS: 10.0000 mg | ORAL_CAPSULE | Freq: Every day | ORAL | Status: DC

## 2014-08-28 NOTE — Progress Notes (Signed)
Subjective:    Patient ID: Julia [name] is a 28 y.o. female.  HPI    Huston Foley, MD, PhD [facility] [address] [address] [place], [place] [address]  Dear Dr. Julio Sicks,   I saw your patient, Julia [name], upon your kind request in my neurologic clinic today for initial consultation of her recurrent headaches. The patient is unaccompanied today. As you know, Julia [name] is a 28 year old right-handed woman with an underlying medical history of anxiety, depression, insomnia, and obesity (on phentermine in the past, not in the last 2-3 months), who reports a history of recurrent headaches for the past 5-6 years. She originally had a headache 1 every week. She had seen a neurologist in the past and had a CT head, which per her was normal. She has associated nausea, no vomiting, no photophobia, no sonophobia, feels like a constant ache and is more in the R side and the top of her head and the bifrontal area. She is currently on promethazine, Norco as needed (1-2 per day), Fioricet (stopped d/t nausea), and Xanax as needed, not every day. She has been on Norco as needed for about a month. She mainly takes ibuprofen 800 mg, daily for the past month.  She has in the last month had a headache nearly daily and has noted a difference in the R side, almost like weakness, but has not dropped anything or tripped but feels heaviness on the R.  Her mother has a history of headache.  She was tried on topamax and amitriptyline, but felt sick and could not tolerate it.   The patient denies prior TIA or stroke symptoms, such as sudden onset of one sided weakness, numbness, tingling, slurring of speech or droopy face, hearing loss, tinnitus, diplopia or visual field cut or monocular loss of vision, and denies recurrent headaches.  Of note, the patient denies snoring, and there is no report of witnessed apneas or choking sensations while asleep.    Her Past  Medical History Is Significant For: Past Medical History  Diagnosis Date  . Chlamydia 2007  . Infection   . Anxiety   . Kidney stones   . GERD (gastroesophageal reflux disease)   . Back pain   . Arthritis   . Depression   . Migraine   . Dyspepsia   . Sinusitis     Her Past Surgical History Is Significant For: Past Surgical History  Procedure Laterality Date  . Dilation and curettage of uterus    . Colonoscopy  2012  . Upper gastrointestinal endoscopy  2012  . Cholecystectomy N/A 04/15/2013    Procedure: LAPAROSCOPIC CHOLECYSTECTOMY;  Surgeon: Shelly Rubenstein, MD;  Location: [facility] OR;  Service: General;  Laterality: N/A;  . Esophagogastroduodenoscopy (egd) with propofol N/A 04/02/2014    Procedure: ESOPHAGOGASTRODUODENOSCOPY (EGD) WITH PROPOFOL;  Surgeon: Willis Modena, MD;  Location: [facility];  Service: Endoscopy;  Laterality: N/A;  . Eus N/A 04/02/2014    Procedure: ESOPHAGEAL ENDOSCOPIC ULTRASOUND (EUS) RADIAL;  Surgeon: Willis Modena, MD;  Location: [facility];  Service: Endoscopy;  Laterality: N/A;    Her Family History Is Significant For: Family History  Problem Relation Age of Onset  . Anesthesia problems Neg Hx   . Diabetes type II Mother   . Hypertension Mother   . Diabetes Mother   . Hyperlipidemia Father   . Hypertension Father     Her Social History Is Significant For: History   Social History  . Marital Status: Married  Spouse Name: Trinna Post    Number of Children: 2  . Years of Education: 12   Occupational History  .      not employed   Social History Main Topics  . Smoking status: Never Smoker   . Smokeless tobacco: Never Used  . Alcohol Use: No  . Drug Use: No  . Sexual Activity: Yes    Birth Control/ Protection: None   Other Topics Concern  . None   Social History Narrative   Patient is right handed , consumes one soda daily    Her Allergies Are:  No Known Allergies:   Her Current Medications Are:  Outpatient Encounter  Prescriptions as of 08/28/2014  Medication Sig  . clonazePAM (KLONOPIN) 0.5 MG tablet Take 0.5 mg by mouth 2 (two) times daily as needed for anxiety.  [name] HYDROcodone-acetaminophen (NORCO/VICODIN) 5-325 MG per tablet Take 1 tablet by mouth every 4 (four) hours as needed for pain.  [name] ibuprofen (ADVIL,MOTRIN) 800 MG tablet Take 800 mg by mouth every 8 (eight) hours as needed.  . ondansetron (ZOFRAN) 4 MG tablet Take 1 tablet (4 mg total) by mouth every 6 (six) hours as needed for nausea.  . ondansetron (ZOFRAN-ODT) 8 MG disintegrating tablet Take 8 mg by mouth every 4 (four) hours as needed for nausea.   [name] topiramate (TOPAMAX) 50 MG tablet Take 50 mg by mouth 2 (two) times daily as needed (For migraine.).  :  Review of Systems:  Out of a complete 14 point review of systems, all are reviewed and negative with the exception of these symptoms as listed below:    Review of Systems  Constitutional: Positive for fatigue.  Eyes: Positive for pain.  Gastrointestinal: Positive for constipation.  Endocrine:       Increased thirst  Musculoskeletal:       Aching muscles  Neurological: Positive for dizziness, weakness and headaches.  Psychiatric/Behavioral:       Anxiety    Objective:  Neurologic Exam  Physical Exam Physical Examination:   Filed Vitals:   08/28/14 0935  BP: 100/62  Pulse: 78  Temp: 98.2 F (36.8 C)    General Examination: The patient is a very pleasant 28 y.o. female in no acute distress. She appears well-developed and well-nourished and well groomed. She is overweight.   HEENT: Normocephalic, atraumatic, pupils are equal, round and reactive to light and accommodation. Funduscopic exam is normal with sharp disc margins noted. Extraocular tracking is good without limitation to gaze excursion or nystagmus noted. Normal smooth pursuit is noted. Hearing is grossly intact. Tympanic membranes are clear bilaterally. Face is symmetric with normal facial animation and normal facial  sensation. Speech is clear with no dysarthria noted. There is no hypophonia. There is no lip, neck/head, jaw or voice tremor. Neck is supple with full range of passive and active motion. There are no carotid bruits on auscultation. Oropharynx exam reveals: mild mouth dryness, good dental hygiene and moderate airway crowding, due to redundant soft palate, tonsils in place, and elongated tone. Mallampati is class I. Tongue protrudes centrally and palate elevates symmetrically. Tonsils are 2+ in size.    Chest: Clear to auscultation without wheezing, rhonchi or crackles noted.  Heart: S1+S2+0, regular and normal without murmurs, rubs or gallops noted.   Abdomen: Soft, non-tender and non-distended with normal bowel sounds appreciated on auscultation.  Extremities: There is no pitting edema in the distal lower extremities bilaterally. Pedal pulses are intact.  Skin: Warm and dry without trophic changes noted. There  are no varicose veins.  Musculoskeletal: exam reveals no obvious joint deformities, tenderness or joint swelling or erythema.   Neurologically:  Mental status: The patient is awake, alert and oriented in all 4 spheres. Her immediate and remote memory, attention, language skills and fund of knowledge are appropriate. There is no evidence of aphasia, agnosia, apraxia or anomia. Speech is clear with normal prosody and enunciation. Thought process is linear. Mood is normal and affect is normal.  Cranial nerves II - XII are as described above under HEENT exam. In addition: shoulder shrug is normal with equal shoulder height noted. Motor exam: Normal bulk, strength and tone is noted, with the exception of a subtle degree of right-sided weakness. This is really mild, in the 5 minus out of 5 category but since she is right dominant it is noticeable. There is no drift, tremor or rebound. Romberg is negative. Reflexes are 2+ throughout. Babinski: Toes are flexor bilaterally. Fine motor skills and  coordination: intact with normal finger taps, normal hand movements, normal rapid alternating patting, normal foot taps and normal foot agility.  Cerebellar testing: No dysmetria or intention tremor on finger to nose testing. Heel to shin is unremarkable bilaterally. There is no truncal or gait ataxia.  Sensory exam: intact to light touch, pinprick, vibration, temperature sense in the upper and lower extremities.  Gait, station and balance: She stands easily. No veering to one side is noted. No leaning to one side is noted. Posture is age-appropriate and stance is narrow based. Gait shows normal stride length and normal pace. No problems turning are noted. She turns en bloc. Tandem walk is unremarkable. Intact toe and heel stance is noted.               Assessment and plan:   In summary, Lagretta Loseke is a very pleasant 28 y.o.-year old female with an underlying  fairly benign medical history who reports a 5 to six-year history of headaches. In the last month she has had daily headaches and has been taking high-dose ibuprofen daily as well as Norco at least once a day. She has associated nausea but no vomiting, no photophobia or sonophobia  as the description of the headache is constant, primarily on the right parietal and temporal area. On examination she has a fairly nonfocal exam with the exception of a subtle degree of right upper and right lower extremity weakness. I do believe this is noticeable even though it is very mild. Otherwise she has no numbness, no drift or change in reflexes on my examination. I would like to go ahead and proceed with a brain MRI with and without contrast. For her headache management I would like to suggest a small dose of nortriptyline. I am aware that she could not tolerate amitriptyline. I would like to go in with a very small dose of 10 mg each night. I talked her about potential side effects. After 2 weeks I would like for her to increase it to 2 pills each night.  I've asked her to reduce her ibuprofen intake and Norco as overusing or chronically using pain medication is not typically helpful for management of headache and can cause rebound or over use headaches as well. Her history is not really suggestive of migraines. This could be a chronic daily headache situation with underlying tension headaches.  I advised the patient about common headache triggers: sleep deprivation, dehydration, overheating, stress, hypoglycemia or skipping meals and blood sugar fluctuations, excessive pain medications or excessive alcohol use  or caffeine withdrawal. Some people have food triggers such as aged cheese, orange juice or chocolate, especially dark chocolate, or MSG (monosodium glutamate). She is to try to avoid these headache triggers as much possible. It may be helpful to keep a headache diary to figure out what makes Her headaches worse or brings them on and what alleviates them. Some people report headache onset after exercise but studies have shown that regular exercise may actually prevent headaches from coming. If She has exercise-induced headaches, She is advised to drink plenty of fluid before and after exercising and that to not overdo it and to not overheat.  We will call her with her MRI results. I would like for her to see our nurse practitioner in 3 months and I will see her back after that. If we need to change her appointment depending on the MRI findings we will take care of that at the time.  I answered all her questions today and the patient was in agreement with the above outlined plan.   Thank you very much for allowing me to participate in the care of this nice patient. If I can be of any further assistance to you please do not hesitate to call me at [phone].  Sincerely,   Huston Foley, MD, PhD

## 2014-08-28 NOTE — Patient Instructions (Addendum)
Please remember, common headache triggers are: sleep deprivation, dehydration, overheating, stress, hypoglycemia or skipping meals and blood sugar fluctuations, excessive pain medications or excessive alcohol use or caffeine withdrawal. Some people have food triggers such as aged cheese, orange juice or chocolate, especially dark chocolate, or MSG (monosodium glutamate). Try to avoid these headache triggers as much possible. It may be helpful to keep a headache diary to figure out what makes your headaches worse or brings them on and what alleviates them. Some people report headache onset after exercise but studies have shown that regular exercise may actually prevent headaches from coming. If you have exercise-induced headaches, please make sure that you drink plenty of fluid before and after exercising and that you do not over do it and do not overheat.  We will try you on low dose nortriptyline 10 mg: Take 1 pill daily at bedtime for 2 weeks, then 2 pills daily at bedtime thereafter. Common side effects reported are: mouth dryness, drowsiness, confusion, dizziness.   Please ask family and friends or your significant other or bed partner if you snore and if so, how loud it is, and if you have breathing related issues in your sleep, such as: snorting sounds, choking sounds, pauses in your breathing or shallow breathing events. These may be symptoms of obstructive sleep apnea (OSA).   Reduce your norco and ibuprofen if possible as overuse of pain meds can exacerbate headaches.

## 2014-09-09 ENCOUNTER — Ambulatory Visit
Admission: RE | Admit: 2014-09-09 | Discharge: 2014-09-09 | Disposition: A | Discharge status: Home / Self Care | Payer: Medicaid Other | Source: Ambulatory Visit | Attending: Neurology | Admitting: Neurology

## 2014-09-09 DIAGNOSIS — E663 Overweight: Secondary | ICD-10-CM

## 2014-09-09 DIAGNOSIS — R531 Weakness: Secondary | ICD-10-CM

## 2014-09-09 DIAGNOSIS — R519 Headache, unspecified: Secondary | ICD-10-CM

## 2014-09-09 DIAGNOSIS — R51 Headache: Principal | ICD-10-CM

## 2014-09-09 MED ORDER — GADOBENATE DIMEGLUMINE 529 MG/ML IV SOLN
13.0000 mL | Freq: Once | INTRAVENOUS | Status: AC | PRN
  Administered 2014-09-09: 13 mL via INTRAVENOUS

## 2014-09-09 NOTE — Progress Notes (Signed)
Quick Note:  Please call patient regarding the recent brain MRI: The brain scan showed a normal structure of the brain and no volume loss or atrophy. There were changes in the deeper structures of the brain, which we call white matter changes or microvascular changes. These were reported as very mild in Her case. These are tiny white spots, that occur with time and are seen in a variety of conditions, including with normal aging, chronic hypertension, chronic headaches, especially migraine HAs, chronic diabetes, chronic hyperlipidemia. These are not strokes and no mass or lesion or contrast enhancement was seen which is reassuring. Again, there were no acute findings, such as a stroke, or mass or blood products. No further action is required on this test at this time, other than re-enforcing the importance of good blood pressure control, good cholesterol control, good blood sugar control, and weight management. Please remind patient to keep any upcoming appointments or tests and to call us with any interim questions, concerns, problems or updates. Thanks,  Huston FoleySaima Carlin Attridge, MD, PhD    ______

## 2014-09-10 ENCOUNTER — Telehealth: Payer: Self-pay | Admitting: Neurology

## 2014-09-10 NOTE — Telephone Encounter (Signed)
Patient returning call regarding MRI results. °

## 2014-09-15 NOTE — Progress Notes (Signed)
Quick Note:  Shared MR Brain results with patient and she verbalized understanding ______ 

## 2014-09-16 NOTE — Telephone Encounter (Signed)
Called patient on 09/15/14 and shared MRI results, she verbalized understanding

## 2014-10-24 ENCOUNTER — Ambulatory Visit (INDEPENDENT_AMBULATORY_CARE_PROVIDER_SITE_OTHER): Payer: Medicaid Other | Admitting: Obstetrics

## 2014-10-24 ENCOUNTER — Encounter: Payer: Self-pay | Admitting: Obstetrics

## 2014-10-24 VITALS — BP 104/70 | HR 79 | Wt 138.0 lb

## 2014-10-24 DIAGNOSIS — N760 Acute vaginitis: Secondary | ICD-10-CM

## 2014-10-24 LAB — POCT URINALYSIS DIPSTICK
Bilirubin, UA: NEGATIVE
GLUCOSE UA: NEGATIVE
KETONES UA: NEGATIVE
Leukocytes, UA: NEGATIVE
Nitrite, UA: NEGATIVE
PH UA: 5
Protein, UA: NEGATIVE
SPEC GRAV UA: 1.025
Urobilinogen, UA: NEGATIVE

## 2014-10-24 NOTE — Progress Notes (Addendum)
Patient ID: Julia [name], female   DOB: [birth year], 28 y.o.   MRN: [id number]  No chief complaint on file.   HPI Julia [name] is a 28 y.o. female.  Vaginal itching.  No abnormal discharge.  HPI  Past Medical History  Diagnosis Date  . Chlamydia 2007  . Infection   . Anxiety   . Kidney stones   . GERD (gastroesophageal reflux disease)   . Back pain   . Arthritis   . Depression   . Migraine   . Dyspepsia   . Sinusitis   . Right sided weakness     Past Surgical History  Procedure Laterality Date  . Dilation and curettage of uterus    . Colonoscopy  2012  . Upper gastrointestinal endoscopy  2012  . Cholecystectomy N/A 04/15/2013    Procedure: LAPAROSCOPIC CHOLECYSTECTOMY;  Surgeon: Shelly Rubensteinouglas A Blackman, MD;  Location: [facility] OR;  Service: General;  Laterality: N/A;  . Esophagogastroduodenoscopy (egd) with propofol N/A 04/02/2014    Procedure: ESOPHAGOGASTRODUODENOSCOPY (EGD) WITH PROPOFOL;  Surgeon: Willis ModenaWilliam Outlaw, MD;  Location: [facility];  Service: Endoscopy;  Laterality: N/A;  . Eus N/A 04/02/2014    Procedure: ESOPHAGEAL ENDOSCOPIC ULTRASOUND (EUS) RADIAL;  Surgeon: Willis ModenaWilliam Outlaw, MD;  Location: [facility];  Service: Endoscopy;  Laterality: N/A;    Family History  Problem Relation Age of Onset  . Anesthesia problems Neg Hx   . Diabetes type II Mother   . Hypertension Mother   . Diabetes Mother   . Hyperlipidemia Father   . Hypertension Father     Social History History  Substance Use Topics  . Smoking status: Never Smoker   . Smokeless tobacco: Never Used  . Alcohol Use: No    No Known Allergies  Current Outpatient Prescriptions  Medication Sig Dispense Refill  . ibuprofen (ADVIL,MOTRIN) 800 MG tablet Take 800 mg by mouth every 8 (eight) hours as needed.    . ondansetron (ZOFRAN) 4 MG tablet Take 1 tablet (4 mg total) by mouth every 6 (six) hours as needed for nausea. 20 tablet 0  . clonazePAM (KLONOPIN) 0.5 MG tablet Take 0.5 mg by mouth 2  (two) times daily as needed for anxiety.    [name]. HYDROcodone-acetaminophen (NORCO/VICODIN) 5-325 MG per tablet Take 1 tablet by mouth every 4 (four) hours as needed for pain. 5 tablet 0  . nortriptyline (PAMELOR) 10 MG capsule Take 1 capsule (10 mg total) by mouth at bedtime. After 2 weeks increase to 2 pills at bedtime daily 60 capsule 3  . ondansetron (ZOFRAN-ODT) 8 MG disintegrating tablet Take 8 mg by mouth every 4 (four) hours as needed for nausea.     [name]. topiramate (TOPAMAX) 50 MG tablet Take 50 mg by mouth 2 (two) times daily as needed (For migraine.).     No current facility-administered medications for this visit.    Review of Systems Review of Systems Constitutional: negative for fatigue and weight loss Respiratory: negative for cough and wheezing Cardiovascular: negative for chest pain, fatigue and palpitations Gastrointestinal: negative for abdominal pain and change in bowel habits Genitourinary:negative Integument/breast: negative for nipple discharge Musculoskeletal:negative for myalgias Neurological: negative for gait problems and tremors Behavioral/Psych: negative for abusive relationship, depression Endocrine: negative for temperature intolerance     Blood pressure 104/70, pulse 79, weight 138 lb (62.596 kg), last menstrual period 09/17/2014.  Physical Exam Physical Exam General:   alert  Skin:   no rash or abnormalities  Lungs:   clear to auscultation bilaterally  Heart:  regular rate and rhythm, S1, S2 normal, no murmur, click, rub or gallop  Breasts:   normal without suspicious masses, skin or nipple changes or axillary nodes  Abdomen:  normal findings: no organomegaly, soft, non-tender and no hernia  Pelvis:  External genitalia: normal general appearance Urinary system: urethral meatus normal and bladder without fullness, nontender Vaginal: normal without tenderness, induration or masses Cervix: normal appearance Adnexa: normal bimanual exam Uterus: anteverted  and non-tender, normal size      Data Reviewed labs  Assessment    Vaginal irritation, probable candida vaginitis.    Plan    Wet prep and cultures sent.  Will treat clinically for presumptive yeast infection.  Orders Placed This Encounter  Procedures  . WET PREP BY MOLECULAR PROBE  . GC/Chlamydia Probe Amp  . POCT urinalysis dipstick   No orders of the defined types were placed in this encounter.       HARPER,CHARLES A 10/24/2014, 2:28 PM

## 2014-10-25 LAB — WET PREP BY MOLECULAR PROBE
Candida species: NEGATIVE
Gardnerella vaginalis: NEGATIVE
Trichomonas vaginosis: NEGATIVE

## 2014-10-25 LAB — GC/CHLAMYDIA PROBE AMP
CT Probe RNA: NEGATIVE
GC PROBE AMP APTIMA: NEGATIVE

## 2014-10-27 ENCOUNTER — Telehealth: Payer: Self-pay | Admitting: *Deleted

## 2014-10-27 NOTE — Telephone Encounter (Signed)
Patient states she was in the office on Friday with complaints of yeast symptoms. Patient states she never had a prescription sent to the pharmacy.  Advised patient would speak with the doctor and call back.  Wet Prep and Cultures are both negative.

## 2014-10-29 ENCOUNTER — Other Ambulatory Visit: Payer: Self-pay | Admitting: Obstetrics

## 2014-10-29 DIAGNOSIS — B373 Candidiasis of vulva and vagina: Secondary | ICD-10-CM

## 2014-10-29 DIAGNOSIS — B3731 Acute candidiasis of vulva and vagina: Secondary | ICD-10-CM

## 2014-10-29 MED ORDER — FLUCONAZOLE 150 MG PO TABS: 150.0000 mg | ORAL_TABLET | Freq: Once | ORAL | Status: DC

## 2014-10-29 NOTE — Telephone Encounter (Signed)
Diflucan Rx 

## 2014-10-30 NOTE — Telephone Encounter (Signed)
Patient notified. Rx sent to the pharmacy. 

## 2014-11-03 ENCOUNTER — Telehealth: Payer: Self-pay | Admitting: Neurology

## 2014-11-03 NOTE — Telephone Encounter (Signed)
Scheduled appt and confirmed with patient 

## 2014-11-27 ENCOUNTER — Ambulatory Visit: Payer: Medicaid Other | Admitting: Nurse Practitioner

## 2014-12-19 ENCOUNTER — Emergency Department (HOSPITAL_COMMUNITY)
Admission: EM | Admit: 2014-12-19 | Discharge: 2014-12-20 | Disposition: A | Discharge status: Home / Self Care | Payer: Medicaid Other | Attending: Emergency Medicine | Admitting: Emergency Medicine

## 2014-12-19 ENCOUNTER — Encounter (HOSPITAL_COMMUNITY): Payer: Self-pay | Admitting: Emergency Medicine

## 2014-12-19 DIAGNOSIS — Z8619 Personal history of other infectious and parasitic diseases: Secondary | ICD-10-CM | POA: Diagnosis not present

## 2014-12-19 DIAGNOSIS — K219 Gastro-esophageal reflux disease without esophagitis: Secondary | ICD-10-CM | POA: Diagnosis not present

## 2014-12-19 DIAGNOSIS — M1990 Unspecified osteoarthritis, unspecified site: Secondary | ICD-10-CM | POA: Diagnosis not present

## 2014-12-19 DIAGNOSIS — G43909 Migraine, unspecified, not intractable, without status migrainosus: Secondary | ICD-10-CM | POA: Insufficient documentation

## 2014-12-19 DIAGNOSIS — R1013 Epigastric pain: Secondary | ICD-10-CM | POA: Diagnosis not present

## 2014-12-19 DIAGNOSIS — R112 Nausea with vomiting, unspecified: Secondary | ICD-10-CM | POA: Diagnosis present

## 2014-12-19 DIAGNOSIS — Z3202 Encounter for pregnancy test, result negative: Secondary | ICD-10-CM | POA: Insufficient documentation

## 2014-12-19 DIAGNOSIS — N390 Urinary tract infection, site not specified: Secondary | ICD-10-CM | POA: Diagnosis not present

## 2014-12-19 DIAGNOSIS — F419 Anxiety disorder, unspecified: Secondary | ICD-10-CM | POA: Insufficient documentation

## 2014-12-19 DIAGNOSIS — Z79899 Other long term (current) drug therapy: Secondary | ICD-10-CM | POA: Diagnosis not present

## 2014-12-19 DIAGNOSIS — R109 Unspecified abdominal pain: Secondary | ICD-10-CM

## 2014-12-19 DIAGNOSIS — F329 Major depressive disorder, single episode, unspecified: Secondary | ICD-10-CM | POA: Diagnosis not present

## 2014-12-19 DIAGNOSIS — Z87442 Personal history of urinary calculi: Secondary | ICD-10-CM | POA: Diagnosis not present

## 2014-12-19 LAB — CBC WITH DIFFERENTIAL/PLATELET
Basophils Absolute: 0 10*3/uL (ref 0.0–0.1)
Basophils Relative: 0 % (ref 0–1)
Eosinophils Absolute: 0.1 10*3/uL (ref 0.0–0.7)
Eosinophils Relative: 1 % (ref 0–5)
HCT: 37.3 % (ref 36.0–46.0)
HEMOGLOBIN: 11.9 g/dL — AB (ref 12.0–15.0)
LYMPHS ABS: 2.8 10*3/uL (ref 0.7–4.0)
LYMPHS PCT: 34 % (ref 12–46)
MCH: 26.1 pg (ref 26.0–34.0)
MCHC: 31.9 g/dL (ref 30.0–36.0)
MCV: 81.8 fL (ref 78.0–100.0)
MONO ABS: 0.5 10*3/uL (ref 0.1–1.0)
Monocytes Relative: 7 % (ref 3–12)
NEUTROS ABS: 4.7 10*3/uL (ref 1.7–7.7)
Neutrophils Relative %: 58 % (ref 43–77)
Platelets: 298 10*3/uL (ref 150–400)
RBC: 4.56 MIL/uL (ref 3.87–5.11)
RDW: 13.8 % (ref 11.5–15.5)
WBC: 8.1 10*3/uL (ref 4.0–10.5)

## 2014-12-19 LAB — URINALYSIS, ROUTINE W REFLEX MICROSCOPIC
Bilirubin Urine: NEGATIVE
Glucose, UA: NEGATIVE mg/dL
Hgb urine dipstick: NEGATIVE
Ketones, ur: NEGATIVE mg/dL
Nitrite: NEGATIVE
Protein, ur: NEGATIVE mg/dL
Specific Gravity, Urine: 1.02 (ref 1.005–1.030)
Urobilinogen, UA: 1 mg/dL (ref 0.0–1.0)
pH: 6 (ref 5.0–8.0)

## 2014-12-19 LAB — COMPREHENSIVE METABOLIC PANEL
ALK PHOS: 84 U/L (ref 39–117)
ALT: 22 U/L (ref 0–35)
AST: 25 U/L (ref 0–37)
Albumin: 4.9 g/dL (ref 3.5–5.2)
Anion gap: 5 (ref 5–15)
BUN: 10 mg/dL (ref 6–23)
CALCIUM: 8.9 mg/dL (ref 8.4–10.5)
CHLORIDE: 106 meq/L (ref 96–112)
CO2: 22 mmol/L (ref 19–32)
CREATININE: 0.67 mg/dL (ref 0.50–1.10)
GFR calc Af Amer: >90 mL/min (ref >90)
Glucose, Bld: 98 mg/dL (ref 70–99)
Potassium: 3.6 mmol/L (ref 3.5–5.1)
Sodium: 133 mmol/L — ABNORMAL LOW (ref 135–145)
Total Bilirubin: 0.6 mg/dL (ref 0.3–1.2)
Total Protein: 8.3 g/dL (ref 6.0–8.3)

## 2014-12-19 LAB — I-STAT TROPONIN, ED: Troponin i, poc: 0 ng/mL (ref 0.00–0.08)

## 2014-12-19 LAB — URINE MICROSCOPIC-ADD ON

## 2014-12-19 LAB — LIPASE, BLOOD: Lipase: 40 U/L (ref 11–59)

## 2014-12-19 LAB — POC URINE PREG, ED: Preg Test, Ur: NEGATIVE

## 2014-12-19 MED ORDER — SODIUM CHLORIDE 0.9 % IV BOLUS (SEPSIS)
1000.0000 mL | Freq: Once | INTRAVENOUS | Status: AC
  Administered 2014-12-20: 1000 mL via INTRAVENOUS

## 2014-12-19 MED ORDER — CEFTRIAXONE SODIUM 1 G IJ SOLR
1.0000 g | Freq: Once | INTRAMUSCULAR | Status: AC
  Administered 2014-12-20: 1 g via INTRAVENOUS
  Filled 2014-12-19: qty 10

## 2014-12-19 MED ORDER — ONDANSETRON HCL 4 MG/2ML IJ SOLN
4.0000 mg | Freq: Once | INTRAMUSCULAR | Status: AC
  Administered 2014-12-20: 4 mg via INTRAVENOUS
  Filled 2014-12-19: qty 2

## 2014-12-19 MED ORDER — GI COCKTAIL ~~LOC~~
30.0000 mL | Freq: Once | ORAL | Status: AC
  Administered 2014-12-20: 30 mL via ORAL
  Filled 2014-12-19: qty 30

## 2014-12-19 NOTE — ED Provider Notes (Signed)
CSN: [id number]     Arrival date & time 12/19/14  1950 History   First MD Initiated Contact with Patient 12/19/14 2304     Chief Complaint  Patient presents with  . Emesis  . Nausea  . Abdominal Pain     (Consider location/radiation/quality/duration/timing/severity/associated sxs/prior Treatment) The history is provided by the patient and medical records. No language interpreter was used.     Julia [name] is a 29 y.o. female  with a hx of anxiety, kidney stones, GERD, back pain, depression, dyspepsia presents to the Emergency Department complaining of gradual, persistent, progressively worsening epigastric abd pain onset several months ago.  Pt seen by PCP and GI with normal endoscopy and pt was placed on omeprazole that did not seem to help.  Pt reports epigastric pain with eating and then she immediately vomits stomach contents.  Emesis is NBNB.  Pt with hx of cholecystectomy.  Pt reports her pain is 7/10, dull and achy.  Pt reports she had developed back pain from the vomiting and it is worst right after emesis.   Pt reports normal BMs but reports they are black.  Pt with normal colonoscopy 3 years ago which was normal.  Pt endorses urinary frequency without dysuria or hematuria.  LMP: 11/26/14  Nothing seems to make it better and eating makes it worse.  Pt denies fever, chills, headache, neck pain, chest pain, SOB, diarrhea, weakness, dizziness, syncope, dysuria.      GastroDulce Sellar  Past Medical History  Diagnosis Date  . Chlamydia 2007  . Infection   . Anxiety   . Kidney stones   . GERD (gastroesophageal reflux disease)   . Back pain   . Arthritis   . Depression   . Migraine   . Dyspepsia   . Sinusitis   . Right sided weakness    Past Surgical History  Procedure Laterality Date  . Dilation and curettage of uterus    . Colonoscopy  2012  . Upper gastrointestinal endoscopy  2012  . Cholecystectomy N/A 04/15/2013    Procedure: LAPAROSCOPIC CHOLECYSTECTOMY;  Surgeon:  Shelly Rubenstein, MD;  Location: [facility] OR;  Service: General;  Laterality: N/A;  . Esophagogastroduodenoscopy (egd) with propofol N/A 04/02/2014    Procedure: ESOPHAGOGASTRODUODENOSCOPY (EGD) WITH PROPOFOL;  Surgeon: Willis Modena, MD;  Location: [facility];  Service: Endoscopy;  Laterality: N/A;  . Eus N/A 04/02/2014    Procedure: ESOPHAGEAL ENDOSCOPIC ULTRASOUND (EUS) RADIAL;  Surgeon: Willis Modena, MD;  Location: [facility];  Service: Endoscopy;  Laterality: N/A;   Family History  Problem Relation Age of Onset  . Anesthesia problems Neg Hx   . Diabetes type II Mother   . Hypertension Mother   . Diabetes Mother   . Hyperlipidemia Father   . Hypertension Father    History  Substance Use Topics  . Smoking status: Never Smoker   . Smokeless tobacco: Never Used  . Alcohol Use: No   OB History    Gravida Para Term Preterm AB TAB SAB Ectopic Multiple Living   Review of Systems  Constitutional: Negative for fever, diaphoresis, appetite change, fatigue and unexpected weight change.  HENT: Negative for mouth sores.   Eyes: Negative for visual disturbance.  Respiratory: Negative for cough, chest tightness, shortness of breath and wheezing.   Cardiovascular: Negative for chest pain.  Gastrointestinal: Positive for nausea, vomiting and abdominal pain. Negative for diarrhea and constipation.  Endocrine: Negative for  polydipsia, polyphagia and polyuria.  Genitourinary: Positive for frequency. Negative for dysuria, urgency and hematuria.  Musculoskeletal: Positive for back pain (low). Negative for neck stiffness.  Skin: Negative for rash.  Allergic/Immunologic: Negative for immunocompromised state.  Neurological: Negative for syncope, light-headedness and headaches.  Hematological: Does not bruise/bleed easily.  Psychiatric/Behavioral: Negative for sleep disturbance. The patient is not nervous/anxious.       Allergies  Review of patient's allergies indicates no  known allergies.  Home Medications   Prior to Admission medications   Medication Sig Start Date End Date Taking? Authorizing Provider  acetaminophen (TYLENOL) 500 MG tablet Take 1,000 mg by mouth every 6 (six) hours as needed.   Yes Historical Provider, MD  ondansetron (ZOFRAN) 4 MG tablet Take 1 tablet (4 mg total) by mouth every 6 (six) hours as needed for nausea. 01/28/13  Yes Rodolph Bong, MD  clonazePAM (KLONOPIN) 0.5 MG tablet Take 0.5 mg by mouth 2 (two) times daily as needed for anxiety.    Historical Provider, MD  fluconazole (DIFLUCAN) 150 MG tablet Take 1 tablet (150 mg total) by mouth once. Patient not taking: Reported on 12/19/2014 10/29/14   Brock Bad, MD  HYDROcodone-acetaminophen (NORCO/VICODIN) 5-325 MG per tablet Take 1 tablet by mouth every 4 (four) hours as needed for pain. Patient not taking: Reported on 12/19/2014 10/02/13   Phill Mutter Dammen, PA-C  ibuprofen (ADVIL,MOTRIN) 800 MG tablet Take 800 mg by mouth every 8 (eight) hours as needed for moderate pain.     Historical Provider, MD  nortriptyline (PAMELOR) 10 MG capsule Take 1 capsule (10 mg total) by mouth at bedtime. After 2 weeks increase to 2 pills at bedtime daily Patient not taking: Reported on 12/19/2014 08/28/14   Huston Foley, MD  ondansetron (ZOFRAN-ODT) 8 MG disintegrating tablet Take 8 mg by mouth every 4 (four) hours as needed for nausea.     Historical Provider, MD  topiramate (TOPAMAX) 50 MG tablet Take 50 mg by mouth 2 (two) times daily as needed (For migraine.).    Historical Provider, MD   BP 156/122 mmHg  Pulse 120  Temp(Src) 98.1 F (36.7 C) (Oral)  Resp 20  SpO2 100%  LMP 11/19/2014 Physical Exam  Constitutional: She appears well-developed and well-nourished. No distress.  HENT:  Head: Normocephalic and atraumatic.  Mouth/Throat: Oropharynx is clear and moist. No oropharyngeal exudate.  Eyes: Conjunctivae are normal. No scleral icterus.  Neck: Normal range of motion. Neck supple.  Full  ROM without pain  Cardiovascular: Normal rate, regular rhythm, normal heart sounds and intact distal pulses.   No murmur heard. No tachycardia  Pulmonary/Chest: Effort normal and breath sounds normal. No respiratory distress. She has no wheezes.  Abdominal: Soft. Bowel sounds are normal. She exhibits no distension and no mass. There is tenderness in the epigastric area. There is CVA tenderness. There is no rebound and no guarding.    Mild epigastric tenderness Bilateral mild CVA tenderness  Musculoskeletal:  Full range of motion of the T-spine and L-spine No tenderness to palpation of the spinous processes of the T-spine or L-spine No tenderness to palpation of the paraspinous muscles of the L-spine  Lymphadenopathy:    She has no cervical adenopathy.  Neurological: She is alert. She has normal reflexes. She exhibits normal muscle tone. Coordination normal.  Reflex Scores:      Bicep reflexes are 2+ on the right side and 2+ on the left side.      Brachioradialis reflexes are 2+ on the  right side and 2+ on the left side.      Patellar reflexes are 2+ on the right side and 2+ on the left side.      Achilles reflexes are 2+ on the right side and 2+ on the left side. Speech is clear and goal oriented, follows commands Normal 5/5 strength in upper and lower extremities bilaterally including dorsiflexion and plantar flexion, strong and equal grip strength Sensation normal to light and sharp touch Moves extremities without ataxia, coordination intact Normal gait Normal balance No Clonus   Skin: Skin is warm and dry. No rash noted. She is not diaphoretic. No erythema.  Psychiatric: She has a normal mood and affect. Her behavior is normal.  Nursing note and vitals reviewed.   ED Course  Procedures (including critical care time) Labs Review Labs Reviewed  CBC WITH DIFFERENTIAL - Abnormal; Notable for the following:    Hemoglobin 11.9 (*)    All other components within normal limits    COMPREHENSIVE METABOLIC PANEL - Abnormal; Notable for the following:    Sodium 133 (*)    All other components within normal limits  URINALYSIS, ROUTINE W REFLEX MICROSCOPIC - Abnormal; Notable for the following:    Leukocytes, UA MODERATE (*)    All other components within normal limits  LIPASE, BLOOD  URINE MICROSCOPIC-ADD ON  I-STAT TROPOININ, ED  POC URINE PREG, ED  POC OCCULT BLOOD, ED    Imaging Review No results found.   EKG Interpretation None      MDM   Final diagnoses:  Abdominal pain  Abdominal pain   Byrd HesselbachMaria [name] presents with history and physical consistent with gastritis and flank pain from UTI. Patient gives some history concerning for possible esophageal stricture but reports that food gets stuck in her chest. She reports only several bites before she vomits. Concern for gastritis and perhaps cyclic vomiting syndrome. Patient with previous upper GI series without peptic ulcer. Will give antiemetics, fluids, GI cocktail, pain medicine and reassess with plan for by mouth trial and discharge home if she does well.   1:49 AM PO trial attempted and pt's pain became sharp, radiating into her back.  On reexam patient is writhing in the bed, tachycardic and hypertensive and significant pain unlike initial exam.  Will CT at an pelvis to rule out perforation of possible peptic ulcer.  The patient was discussed with and seen by Dr. Patria Maneampos who agrees with the treatment plan.  1:54 AM  Care transferred to Azalia BilisKevin Campos, MD awaiting results of CT scan.  BP 156/122 mmHg  Pulse 120  Temp(Src) 98.1 F (36.7 C) (Oral)  Resp 20  SpO2 100%  LMP 11/19/2014    Dahlia ClientHannah Elton Catalano, PA-C 12/20/14 0154  Lyanne CoKevin M Campos, MD 12/20/14 [phone]

## 2014-12-19 NOTE — ED Notes (Addendum)
Pt c/o mid upper abdominal pain x2 days. Says, "when I eat I feel like I need to throw it back up and now I'm having left lower back pain that radiates to the middle of my back." Patient has had gallbladder removed. Did not take anything for pain today. Denies chest pain/SOB. Takes Omeprazole for GERD. Denies being around anyone that is sick. Ambulatory, RR even-unlabored. No other questions/concerns.

## 2014-12-20 ENCOUNTER — Emergency Department (HOSPITAL_COMMUNITY): Payer: Medicaid Other

## 2014-12-20 LAB — POC OCCULT BLOOD, ED: Fecal Occult Bld: NEGATIVE

## 2014-12-20 MED ORDER — HYDROMORPHONE HCL 1 MG/ML IJ SOLN
1.0000 mg | INTRAMUSCULAR | Status: DC | PRN
  Administered 2014-12-20: 1 mg via INTRAVENOUS
  Filled 2014-12-20: qty 1

## 2014-12-20 MED ORDER — IOHEXOL 300 MG/ML  SOLN
100.0000 mL | Freq: Once | INTRAMUSCULAR | Status: AC | PRN
  Administered 2014-12-20: 100 mL via INTRAVENOUS

## 2014-12-20 MED ORDER — ONDANSETRON 8 MG PO TBDP: 8.0000 mg | ORAL_TABLET | Freq: Three times a day (TID) | ORAL | Status: DC | PRN

## 2014-12-20 MED ORDER — SUCRALFATE 1 GM/10ML PO SUSP: 1.0000 g | Freq: Two times a day (BID) | ORAL | Status: DC

## 2014-12-20 MED ORDER — IOHEXOL 300 MG/ML  SOLN
50.0000 mL | Freq: Once | INTRAMUSCULAR | Status: AC | PRN
  Administered 2014-12-20: 50 mL via ORAL

## 2014-12-20 MED ORDER — METOCLOPRAMIDE HCL 10 MG PO TABS: 10.0000 mg | ORAL_TABLET | Freq: Four times a day (QID) | ORAL | Status: DC | PRN

## 2014-12-20 MED ORDER — MORPHINE SULFATE 4 MG/ML IJ SOLN
4.0000 mg | Freq: Once | INTRAMUSCULAR | Status: AC
  Administered 2014-12-20: 4 mg via INTRAVENOUS
  Filled 2014-12-20: qty 1

## 2014-12-20 MED ORDER — METOCLOPRAMIDE HCL 5 MG/ML IJ SOLN
10.0000 mg | Freq: Once | INTRAMUSCULAR | Status: AC
  Administered 2014-12-20: 10 mg via INTRAVENOUS
  Filled 2014-12-20: qty 2

## 2014-12-20 MED ORDER — HYDROMORPHONE HCL 1 MG/ML IJ SOLN
1.0000 mg | Freq: Once | INTRAMUSCULAR | Status: AC
  Administered 2014-12-20: 1 mg via INTRAVENOUS
  Filled 2014-12-20: qty 1

## 2014-12-20 MED ORDER — KETOROLAC TROMETHAMINE 30 MG/ML IJ SOLN
30.0000 mg | Freq: Once | INTRAMUSCULAR | Status: AC
  Administered 2014-12-20: 30 mg via INTRAVENOUS
  Filled 2014-12-20: qty 1

## 2014-12-20 NOTE — ED Notes (Signed)
Pt calling husband for a ride home

## 2014-12-20 NOTE — Discharge Instructions (Signed)

## 2014-12-20 NOTE — ED Notes (Signed)
Two unsuccessful attempts at IV insertion. Another RN to attempt.

## 2014-12-20 NOTE — ED Notes (Signed)
MD Campos at bedside.  

## 2014-12-20 NOTE — ED Notes (Signed)
PA at bedside.

## 2014-12-20 NOTE — ED Notes (Addendum)
Pt tearful and states her pain is worse after trying to eat and drink.

## 2014-12-22 ENCOUNTER — Ambulatory Visit: Payer: Medicaid Other | Admitting: Obstetrics

## 2014-12-25 ENCOUNTER — Ambulatory Visit: Payer: Self-pay | Admitting: Neurology

## 2015-01-07 ENCOUNTER — Telehealth: Payer: Self-pay | Admitting: *Deleted

## 2015-01-07 NOTE — Telephone Encounter (Signed)
Spoke with patient - she c/o discharge after antibiotic treatment. Request appointment and scheduled.

## 2015-01-07 NOTE — Telephone Encounter (Signed)
Request call back- 1:55 LM on VM to CB

## 2015-01-08 ENCOUNTER — Other Ambulatory Visit: Payer: Self-pay | Admitting: *Deleted

## 2015-01-08 ENCOUNTER — Other Ambulatory Visit: Payer: Self-pay | Admitting: Gastroenterology

## 2015-01-08 ENCOUNTER — Encounter: Payer: Self-pay | Admitting: Obstetrics

## 2015-01-08 ENCOUNTER — Ambulatory Visit (INDEPENDENT_AMBULATORY_CARE_PROVIDER_SITE_OTHER): Payer: Medicaid Other | Admitting: Obstetrics

## 2015-01-08 VITALS — BP 107/73 | HR 76 | Temp 98.6°F | Wt 138.0 lb

## 2015-01-08 DIAGNOSIS — A499 Bacterial infection, unspecified: Secondary | ICD-10-CM

## 2015-01-08 DIAGNOSIS — Z0000 Encounter for general adult medical examination without abnormal findings: Secondary | ICD-10-CM

## 2015-01-08 DIAGNOSIS — B373 Candidiasis of vulva and vagina: Secondary | ICD-10-CM

## 2015-01-08 DIAGNOSIS — R1310 Dysphagia, unspecified: Secondary | ICD-10-CM

## 2015-01-08 DIAGNOSIS — B9689 Other specified bacterial agents as the cause of diseases classified elsewhere: Secondary | ICD-10-CM

## 2015-01-08 DIAGNOSIS — N760 Acute vaginitis: Secondary | ICD-10-CM

## 2015-01-08 DIAGNOSIS — R1013 Epigastric pain: Secondary | ICD-10-CM

## 2015-01-08 DIAGNOSIS — N946 Dysmenorrhea, unspecified: Secondary | ICD-10-CM

## 2015-01-08 DIAGNOSIS — B3731 Acute candidiasis of vulva and vagina: Secondary | ICD-10-CM

## 2015-01-08 MED ORDER — CITRANATAL HARMONY 27-1-260 MG PO CAPS: 1.0000 | ORAL_CAPSULE | Freq: Every day | ORAL | Status: DC

## 2015-01-08 MED ORDER — FLUCONAZOLE 150 MG PO TABS: 150.0000 mg | ORAL_TABLET | Freq: Once | ORAL | Status: DC

## 2015-01-08 MED ORDER — METRONIDAZOLE 0.75 % VA GEL: 1.0000 | Freq: Two times a day (BID) | VAGINAL | Status: AC

## 2015-01-08 MED ORDER — IBUPROFEN 800 MG PO TABS: 800.0000 mg | ORAL_TABLET | Freq: Three times a day (TID) | ORAL | Status: DC | PRN

## 2015-01-08 MED ORDER — PNV PRENATAL PLUS MULTIVITAMIN 27-1 MG PO TABS: 1.0000 | ORAL_TABLET | Freq: Every day | ORAL | Status: DC

## 2015-01-08 NOTE — Progress Notes (Signed)
Patient ID: Julia [name], female   DOB: 11-26-1986, 29 y.o.   MRN: [id number]  Chief Complaint  Patient presents with  . Gynecologic Exam    discharge    HPI Julia [name] is a 29 y.o. female.  Malodorous vaginal discharge with itching.  Also c/o painful and heavy periods. HPI  Past Medical History  Diagnosis Date  . Chlamydia 2007  . Infection   . Anxiety   . Kidney stones   . GERD (gastroesophageal reflux disease)   . Back pain   . Arthritis   . Depression   . Migraine   . Dyspepsia   . Sinusitis   . Right sided weakness     Past Surgical History  Procedure Laterality Date  . Dilation and curettage of uterus    . Colonoscopy  2012  . Upper gastrointestinal endoscopy  2012  . Cholecystectomy N/A 04/15/2013    Procedure: LAPAROSCOPIC CHOLECYSTECTOMY;  Surgeon: Shelly Rubensteinouglas A Blackman, MD;  Location: [facility] OR;  Service: General;  Laterality: N/A;  . Esophagogastroduodenoscopy (egd) with propofol N/A 04/02/2014    Procedure: ESOPHAGOGASTRODUODENOSCOPY (EGD) WITH PROPOFOL;  Surgeon: Willis ModenaWilliam Outlaw, MD;  Location: [facility];  Service: Endoscopy;  Laterality: N/A;  . Eus N/A 04/02/2014    Procedure: ESOPHAGEAL ENDOSCOPIC ULTRASOUND (EUS) RADIAL;  Surgeon: Willis ModenaWilliam Outlaw, MD;  Location: [facility];  Service: Endoscopy;  Laterality: N/A;    Family History  Problem Relation Age of Onset  . Anesthesia problems Neg Hx   . Diabetes type II Mother   . Hypertension Mother   . Diabetes Mother   . Hyperlipidemia Father   . Hypertension Father     Social History History  Substance Use Topics  . Smoking status: Never Smoker   . Smokeless tobacco: Never Used  . Alcohol Use: No    No Known Allergies  Current Outpatient Prescriptions  Medication Sig Dispense Refill  . acetaminophen (TYLENOL) 500 MG tablet Take 1,000 mg by mouth every 6 (six) hours as needed.    [name]. omeprazole (PRILOSEC) 20 MG capsule Take 20 mg by mouth 2 (two) times daily before a meal.    .  ondansetron (ZOFRAN ODT) 8 MG disintegrating tablet Take 1 tablet (8 mg total) by mouth every 8 (eight) hours as needed for nausea or vomiting. 12 tablet 0  . sucralfate (CARAFATE) 1 GM/10ML suspension Take 10 mLs (1 g total) by mouth 2 (two) times daily. 420 mL 0  . clonazePAM (KLONOPIN) 0.5 MG tablet Take 0.5 mg by mouth 2 (two) times daily as needed for anxiety.    . fluconazole (DIFLUCAN) 150 MG tablet Take 1 tablet (150 mg total) by mouth once. 1 tablet 2  . HYDROcodone-acetaminophen (NORCO/VICODIN) 5-325 MG per tablet Take 1 tablet by mouth every 4 (four) hours as needed for pain. (Patient not taking: Reported on 12/19/2014) 5 tablet 0  . ibuprofen (ADVIL,MOTRIN) 800 MG tablet Take 1 tablet (800 mg total) by mouth every 8 (eight) hours as needed for mild pain or moderate pain. 60 tablet 5  . metoCLOPramide (REGLAN) 10 MG tablet Take 1 tablet (10 mg total) by mouth every 6 (six) hours as needed for nausea or vomiting. (Patient not taking: Reported on 01/08/2015) 15 tablet 0  . metroNIDAZOLE (METROGEL VAGINAL) 0.75 % vaginal gel Place 1 Applicatorful vaginally 2 (two) times daily. 70 g 2  . nortriptyline (PAMELOR) 10 MG capsule Take 1 capsule (10 mg total) by mouth at bedtime. After 2 weeks increase to 2 pills at bedtime  daily (Patient not taking: Reported on 12/19/2014) 60 capsule 3  . ondansetron (ZOFRAN) 4 MG tablet Take 1 tablet (4 mg total) by mouth every 6 (six) hours as needed for nausea. (Patient not taking: Reported on 01/08/2015) 20 tablet 0  . Prenat-FeFmCb-DSS-FA-DHA w/o A (CITRANATAL HARMONY) 27-1-260 MG CAPS Take 1 capsule by mouth daily before breakfast. 30 capsule 11  . topiramate (TOPAMAX) 50 MG tablet Take 50 mg by mouth 2 (two) times daily as needed (For migraine.).     No current facility-administered medications for this visit.    Review of Systems Review of Systems Constitutional: negative for fatigue and weight loss Respiratory: negative for cough and wheezing Cardiovascular:  negative for chest pain, fatigue and palpitations Gastrointestinal: negative for abdominal pain and change in bowel habits Genitourinary:  Malodorous vaginal discharge.  Painful and heavy periods. Integument/breast: negative for nipple discharge Musculoskeletal:negative for myalgias Neurological: negative for gait problems and tremors Behavioral/Psych: negative for abusive relationship, depression Endocrine: negative for temperature intolerance     Blood pressure 107/73, pulse 76, temperature 98.6 F (37 C), weight 138 lb (62.596 kg), last menstrual period 12/25/2014.  Physical Exam Physical Exam General:   alert  Skin:   no rash or abnormalities  Lungs:   clear to auscultation bilaterally  Heart:   regular rate and rhythm, S1, S2 normal, no murmur, click, rub or gallop  Breasts:   normal without suspicious masses, skin or nipple changes or axillary nodes  Abdomen:  normal findings: no organomegaly, soft, non-tender and no hernia  Pelvis:  External genitalia: normal general appearance Urinary system: urethral meatus normal and bladder without fullness, nontender Vaginal: normal without tenderness, induration or masses Cervix: normal appearance Adnexa: normal bimanual exam Uterus: anteverted and non-tender, normal size      Data Reviewed Labs  Assessment    BV. Dysmenorrhea     Plan  Metro Gel Vaginal Rx.   Orders Placed This Encounter  Procedures  . SureSwab, Vaginosis/Vaginitis Plus   Meds ordered this encounter  Medications  . omeprazole (PRILOSEC) 20 MG capsule    Sig: Take 20 mg by mouth 2 (two) times daily before a meal.  . metroNIDAZOLE (METROGEL VAGINAL) 0.75 % vaginal gel    Sig: Place 1 Applicatorful vaginally 2 (two) times daily.    Dispense:  70 g    Refill:  2  . fluconazole (DIFLUCAN) 150 MG tablet    Sig: Take 1 tablet (150 mg total) by mouth once.    Dispense:  1 tablet    Refill:  2  . ibuprofen (ADVIL,MOTRIN) 800 MG tablet    Sig: Take 1  tablet (800 mg total) by mouth every 8 (eight) hours as needed for mild pain or moderate pain.    Dispense:  60 tablet    Refill:  5  . Prenat-FeFmCb-DSS-FA-DHA w/o A (CITRANATAL HARMONY) 27-1-260 MG CAPS    Sig: Take 1 capsule by mouth daily before breakfast.    Dispense:  30 capsule    Refill:  11

## 2015-01-12 LAB — SURESWAB, VAGINOSIS/VAGINITIS PLUS
ATOPOBIUM VAGINAE: DETECTED Log (cells/mL)
BV CATEGORY: UNDETERMINED — AB
C. albicans, DNA: NOT DETECTED
C. glabrata, DNA: NOT DETECTED
C. parapsilosis, DNA: NOT DETECTED
C. trachomatis RNA, TMA: NOT DETECTED
C. tropicalis, DNA: NOT DETECTED
Gardnerella vaginalis: <4.7 Log (cells/mL)
LACTOBACILLUS SPECIES: 7.2 Log (cells/mL)
MEGASPHAERA SPECIES: NOT DETECTED Log (cells/mL)
N. gonorrhoeae RNA, TMA: NOT DETECTED
T. vaginalis RNA, QL TMA: NOT DETECTED

## 2015-01-13 ENCOUNTER — Other Ambulatory Visit: Payer: Self-pay | Admitting: Obstetrics

## 2015-01-14 ENCOUNTER — Ambulatory Visit
Admission: RE | Admit: 2015-01-14 | Discharge: 2015-01-14 | Disposition: A | Discharge status: Home / Self Care | Payer: Medicaid Other | Source: Ambulatory Visit | Attending: Gastroenterology | Admitting: Gastroenterology

## 2015-01-14 DIAGNOSIS — R1013 Epigastric pain: Secondary | ICD-10-CM

## 2015-01-14 DIAGNOSIS — R1310 Dysphagia, unspecified: Secondary | ICD-10-CM

## 2015-02-11 ENCOUNTER — Telehealth: Payer: Self-pay | Admitting: Neurology

## 2015-02-11 NOTE — Telephone Encounter (Signed)
Patient has rescheduled /confirmed time change on 03/02/15 with Dr Frances FurbishAthar

## 2015-03-02 ENCOUNTER — Encounter (HOSPITAL_COMMUNITY)
Admission: RE | Disposition: A | Discharge status: Home / Self Care | Payer: Self-pay | Source: Ambulatory Visit | Attending: Gastroenterology

## 2015-03-02 ENCOUNTER — Ambulatory Visit (HOSPITAL_COMMUNITY)
Admission: RE | Admit: 2015-03-02 | Discharge: 2015-03-02 | Disposition: A | Discharge status: Home / Self Care | Payer: Medicaid Other | Source: Ambulatory Visit | Attending: Gastroenterology | Admitting: Gastroenterology

## 2015-03-02 ENCOUNTER — Ambulatory Visit: Payer: Medicaid Other | Admitting: Neurology

## 2015-03-02 DIAGNOSIS — M1990 Unspecified osteoarthritis, unspecified site: Secondary | ICD-10-CM | POA: Insufficient documentation

## 2015-03-02 DIAGNOSIS — K219 Gastro-esophageal reflux disease without esophagitis: Secondary | ICD-10-CM | POA: Diagnosis not present

## 2015-03-02 DIAGNOSIS — F419 Anxiety disorder, unspecified: Secondary | ICD-10-CM | POA: Insufficient documentation

## 2015-03-02 DIAGNOSIS — R1310 Dysphagia, unspecified: Secondary | ICD-10-CM | POA: Insufficient documentation

## 2015-03-02 DIAGNOSIS — Z79899 Other long term (current) drug therapy: Secondary | ICD-10-CM | POA: Insufficient documentation

## 2015-03-02 DIAGNOSIS — F329 Major depressive disorder, single episode, unspecified: Secondary | ICD-10-CM | POA: Diagnosis not present

## 2015-03-02 HISTORY — PX: ESOPHAGEAL MANOMETRY: SHX5429

## 2015-03-02 SURGERY — MANOMETRY, ESOPHAGUS

## 2015-03-02 MED ORDER — LIDOCAINE VISCOUS 2 % MT SOLN
OROMUCOSAL | Status: AC
  Filled 2015-03-02: qty 15

## 2015-03-02 SURGICAL SUPPLY — 2 items
FACESHIELD LNG OPTICON STERILE (SAFETY) IMPLANT
GLOVE BIO SURGEON STRL SZ8 (GLOVE) ×6 IMPLANT

## 2015-03-03 ENCOUNTER — Encounter (HOSPITAL_COMMUNITY): Payer: Self-pay | Admitting: Gastroenterology

## 2015-04-20 ENCOUNTER — Ambulatory Visit (INDEPENDENT_AMBULATORY_CARE_PROVIDER_SITE_OTHER): Payer: Medicaid Other | Admitting: Obstetrics

## 2015-04-20 ENCOUNTER — Encounter: Payer: Self-pay | Admitting: Obstetrics

## 2015-04-20 VITALS — BP 113/79 | HR 77 | Temp 98.0°F | Ht 60.0 in | Wt 136.0 lb

## 2015-04-20 DIAGNOSIS — Z0000 Encounter for general adult medical examination without abnormal findings: Secondary | ICD-10-CM | POA: Diagnosis not present

## 2015-04-20 DIAGNOSIS — Z01419 Encounter for gynecological examination (general) (routine) without abnormal findings: Secondary | ICD-10-CM

## 2015-04-20 DIAGNOSIS — R300 Dysuria: Secondary | ICD-10-CM

## 2015-04-20 DIAGNOSIS — Z124 Encounter for screening for malignant neoplasm of cervix: Secondary | ICD-10-CM

## 2015-04-20 LAB — POCT URINALYSIS DIPSTICK
Bilirubin, UA: NEGATIVE
Blood, UA: NEGATIVE
Glucose, UA: NEGATIVE
KETONES UA: NEGATIVE
Nitrite, UA: NEGATIVE
PROTEIN UA: NEGATIVE
SPEC GRAV UA: 1.02
Urobilinogen, UA: NEGATIVE
pH, UA: 5

## 2015-04-20 MED ORDER — NITROFURANTOIN MONOHYD MACRO 100 MG PO CAPS: 100.0000 mg | ORAL_CAPSULE | Freq: Two times a day (BID) | ORAL | Status: DC

## 2015-04-20 NOTE — Progress Notes (Signed)
Subjective:        Julia [name] is a 29 y.o. female here for a routine exam.  Current complaints: Burning with urination.    Personal health questionnaire:  Is patient [name], have a family history of breast and/or ovarian cancer: no Is there a family history of uterine cancer diagnosed at age < 2850, gastrointestinal cancer, urinary tract cancer, family member who is a Personnel officerLynch syndrome-associated carrier: no Is the patient overweight and hypertensive, family history of diabetes, personal history of gestational diabetes, preeclampsia or PCOS: no Is patient over 2755, have PCOS,  family history of premature CHD under age 31, diabetes, smoke, have hypertension or peripheral artery disease:  no At any time, has a partner hit, kicked or otherwise hurt or frightened you?: no Over the past 2 weeks, have you felt down, depressed or hopeless?: no Over the past 2 weeks, have you felt little interest or pleasure in doing things?:no   Gynecologic History Patient's last menstrual period was 03/29/2015. Contraception: condoms Last Pap: 2 / 2015. Results were: normal Last mammogram: n/a. Results were: n/a  Obstetric History OB History  Gravida Para Term Preterm AB SAB TAB Ectopic Multiple Living  3 2   1 1    2     # Outcome Date GA Lbr Len/2nd Weight Sex Delivery Anes PTL Lv  3 SAB           2 Para           1 Para               Past Medical History  Diagnosis Date  . Chlamydia 2007  . Infection   . Anxiety   . Kidney stones   . GERD (gastroesophageal reflux disease)   . Back pain   . Arthritis   . Depression   . Migraine   . Dyspepsia   . Sinusitis   . Right sided weakness     Past Surgical History  Procedure Laterality Date  . Dilation and curettage of uterus    . Colonoscopy  2012  . Upper gastrointestinal endoscopy  2012  . Cholecystectomy N/A 04/15/2013    Procedure: LAPAROSCOPIC CHOLECYSTECTOMY;  Surgeon: Shelly Rubensteinouglas A Blackman, MD;  Location: [facility];   Service: General;  Laterality: N/A;  . Esophagogastroduodenoscopy (egd) with propofol N/A 04/02/2014    Procedure: ESOPHAGOGASTRODUODENOSCOPY (EGD) WITH PROPOFOL;  Surgeon: Willis ModenaWilliam Outlaw, MD;  Location: [facility];  Service: Endoscopy;  Laterality: N/A;  . Eus N/A 04/02/2014    Procedure: ESOPHAGEAL ENDOSCOPIC ULTRASOUND (EUS) RADIAL;  Surgeon: Willis ModenaWilliam Outlaw, MD;  Location: [facility];  Service: Endoscopy;  Laterality: N/A;  . Esophageal manometry N/A 03/02/2015    Procedure: ESOPHAGEAL MANOMETRY (EM);  Surgeon: Willis ModenaWilliam Outlaw, MD;  Location: [facility];  Service: Endoscopy;  Laterality: N/A;     Current outpatient prescriptions:  .  omeprazole (PRILOSEC) 20 MG capsule, Take 20 mg by mouth 2 (two) times daily before a meal., Disp: , Rfl:  .  Terbinafine HCl 125 MG PACK, Take by mouth., Disp: , Rfl:  .  nitrofurantoin, macrocrystal-monohydrate, (MACROBID) 100 MG capsule, Take 1 capsule (100 mg total) by mouth 2 (two) times daily. 1 po BID x 7days, Disp: 14 capsule, Rfl: 2 No Known Allergies  History  Substance Use Topics  . Smoking status: Never Smoker   . Smokeless tobacco: Never Used  . Alcohol Use: No    Family History  Problem Relation Age of Onset  . Anesthesia problems Neg Hx   .  Diabetes type II Mother   . Hypertension Mother   . Diabetes Mother   . Hyperlipidemia Father   . Hypertension Father       Review of Systems  Constitutional: negative for fatigue and weight loss Respiratory: negative for cough and wheezing Cardiovascular: negative for chest pain, fatigue and palpitations Gastrointestinal: negative for abdominal pain and change in bowel habits Musculoskeletal:negative for myalgias Neurological: negative for gait problems and tremors Behavioral/Psych: negative for abusive relationship, depression Endocrine: negative for temperature intolerance   Genitourinary:negative for abnormal menstrual periods, genital lesions, hot flashes, sexual problems and vaginal  discharge Integument/breast: negative for breast lump, breast tenderness, nipple discharge and skin lesion(s)    Objective:       BP 113/79 mmHg  Pulse 77  Temp(Src) 98 F (36.7 C)  Ht 5' (1.524 m)  Wt 136 lb (61.689 kg)  BMI 26.56 kg/m2  LMP 03/29/2015 General:   alert  Skin:   no rash or abnormalities  Lungs:   clear to auscultation bilaterally  Heart:   regular rate and rhythm, S1, S2 normal, no murmur, click, rub or gallop  Breasts:   normal without suspicious masses, skin or nipple changes or axillary nodes  Abdomen:  normal findings: no organomegaly, soft, non-tender and no hernia  Pelvis:  External genitalia: normal general appearance Urinary system: urethral meatus normal and bladder without fullness, nontender Vaginal: normal without tenderness, induration or masses Cervix: normal appearance Adnexa: normal bimanual exam Uterus: anteverted and non-tender, normal size   Lab Review Urine pregnancy test Labs reviewed yes Radiologic studies reviewed yes    Assessment:    Healthy female exam.    Dysuria   Plan:    Education reviewed: calcium supplements, depression evaluation, low fat, low cholesterol diet, self breast exams and weight bearing exercise. Contraception: condoms. Follow up in: 1 year.  Macrobid Rx  Meds ordered this encounter  Medications  . Terbinafine HCl 125 MG PACK    Sig: Take by mouth.  . nitrofurantoin, macrocrystal-monohydrate, (MACROBID) 100 MG capsule    Sig: Take 1 capsule (100 mg total) by mouth 2 (two) times daily. 1 po BID x 7days    Dispense:  14 capsule    Refill:  2   Orders Placed This Encounter  Procedures  . SureSwab, Vaginosis/Vaginitis Plus  . POCT urinalysis dipstick

## 2015-04-22 LAB — PAP IG AND HPV HIGH-RISK: HPV DNA High Risk: NOT DETECTED

## 2015-04-22 LAB — URINE CULTURE: Colony Count: >=100000

## 2015-04-23 LAB — SURESWAB, VAGINOSIS/VAGINITIS PLUS
Atopobium vaginae: NOT DETECTED Log (cells/mL)
C. ALBICANS, DNA: NOT DETECTED
C. GLABRATA, DNA: NOT DETECTED
C. PARAPSILOSIS, DNA: NOT DETECTED
C. trachomatis RNA, TMA: NOT DETECTED
C. tropicalis, DNA: NOT DETECTED
Gardnerella vaginalis: <4.7 Log (cells/mL)
LACTOBACILLUS SPECIES: 7.6 Log (cells/mL)
MEGASPHAERA SPECIES: NOT DETECTED Log (cells/mL)
N. gonorrhoeae RNA, TMA: NOT DETECTED
T. vaginalis RNA, QL TMA: NOT DETECTED

## 2015-05-28 ENCOUNTER — Encounter: Payer: Self-pay | Admitting: Obstetrics

## 2015-05-28 ENCOUNTER — Other Ambulatory Visit (INDEPENDENT_AMBULATORY_CARE_PROVIDER_SITE_OTHER): Payer: Medicaid Other

## 2015-05-28 VITALS — BP 117/76 | HR 99 | Temp 98.8°F | Ht 60.0 in | Wt 139.0 lb

## 2015-05-28 DIAGNOSIS — N926 Irregular menstruation, unspecified: Secondary | ICD-10-CM | POA: Diagnosis not present

## 2015-05-28 LAB — POCT URINE PREGNANCY: PREG TEST UR: POSITIVE — AB

## 2015-05-28 NOTE — Progress Notes (Signed)
Patient in office for a confirmation of pregnancy. Patient states she had a positive home pregnancy test. Pregnancy Test in office is positive. Patient states this is an intended and wanted pregnancy. Patient encouraged to continue her prenatal vitamins, drink plenty of fluids, and to schedule a NOB appointment.  Patient verbalized understanding.  BP 117/76 mmHg  Pulse 99  Temp(Src) 98.8 F (37.1 C)  Ht 5' (1.524 m)  Wt 139 lb (63.05 kg)  BMI 27.15 kg/m2  LMP 04/21/2015

## 2015-06-03 ENCOUNTER — Inpatient Hospital Stay (HOSPITAL_COMMUNITY)
Admission: AD | Admit: 2015-06-03 | Discharge: 2015-06-04 | Disposition: A | Discharge status: Home / Self Care | Payer: Medicaid Other | Source: Ambulatory Visit | Attending: Obstetrics | Admitting: Obstetrics

## 2015-06-03 ENCOUNTER — Encounter (HOSPITAL_COMMUNITY): Payer: Self-pay

## 2015-06-03 ENCOUNTER — Telehealth: Payer: Self-pay | Admitting: *Deleted

## 2015-06-03 ENCOUNTER — Inpatient Hospital Stay (HOSPITAL_COMMUNITY): Payer: Medicaid Other

## 2015-06-03 DIAGNOSIS — O99341 Other mental disorders complicating pregnancy, first trimester: Secondary | ICD-10-CM | POA: Insufficient documentation

## 2015-06-03 DIAGNOSIS — O210 Mild hyperemesis gravidarum: Secondary | ICD-10-CM | POA: Insufficient documentation

## 2015-06-03 DIAGNOSIS — R109 Unspecified abdominal pain: Secondary | ICD-10-CM | POA: Insufficient documentation

## 2015-06-03 DIAGNOSIS — F419 Anxiety disorder, unspecified: Secondary | ICD-10-CM | POA: Insufficient documentation

## 2015-06-03 DIAGNOSIS — O219 Vomiting of pregnancy, unspecified: Secondary | ICD-10-CM

## 2015-06-03 DIAGNOSIS — Z87442 Personal history of urinary calculi: Secondary | ICD-10-CM | POA: Insufficient documentation

## 2015-06-03 DIAGNOSIS — O26899 Other specified pregnancy related conditions, unspecified trimester: Secondary | ICD-10-CM

## 2015-06-03 DIAGNOSIS — Z3A01 Less than 8 weeks gestation of pregnancy: Secondary | ICD-10-CM | POA: Insufficient documentation

## 2015-06-03 LAB — URINALYSIS, ROUTINE W REFLEX MICROSCOPIC
Bilirubin Urine: NEGATIVE
Glucose, UA: NEGATIVE mg/dL
Hgb urine dipstick: NEGATIVE
Ketones, ur: 15 mg/dL — AB
Leukocytes, UA: NEGATIVE
NITRITE: NEGATIVE
Protein, ur: NEGATIVE mg/dL
Specific Gravity, Urine: >1.03 — ABNORMAL HIGH (ref 1.005–1.030)
UROBILINOGEN UA: 2 mg/dL — AB (ref 0.0–1.0)
pH: 6 (ref 5.0–8.0)

## 2015-06-03 LAB — CBC WITH DIFFERENTIAL/PLATELET
BASOS PCT: 0 % (ref 0–1)
Basophils Absolute: 0 10*3/uL (ref 0.0–0.1)
EOS ABS: 0.1 10*3/uL (ref 0.0–0.7)
Eosinophils Relative: 1 % (ref 0–5)
HCT: 32.6 % — ABNORMAL LOW (ref 36.0–46.0)
Hemoglobin: 10.9 g/dL — ABNORMAL LOW (ref 12.0–15.0)
Lymphocytes Relative: 34 % (ref 12–46)
Lymphs Abs: 3.1 10*3/uL (ref 0.7–4.0)
MCH: 26.7 pg (ref 26.0–34.0)
MCHC: 33.4 g/dL (ref 30.0–36.0)
MCV: 79.9 fL (ref 78.0–100.0)
Monocytes Absolute: 0.7 10*3/uL (ref 0.1–1.0)
Monocytes Relative: 7 % (ref 3–12)
NEUTROS PCT: 58 % (ref 43–77)
Neutro Abs: 5.2 10*3/uL (ref 1.7–7.7)
PLATELETS: 270 10*3/uL (ref 150–400)
RBC: 4.08 MIL/uL (ref 3.87–5.11)
RDW: 14.4 % (ref 11.5–15.5)
WBC: 9.1 10*3/uL (ref 4.0–10.5)

## 2015-06-03 LAB — OB RESULTS CONSOLE GC/CHLAMYDIA: Gonorrhea: NEGATIVE

## 2015-06-03 LAB — COMPREHENSIVE METABOLIC PANEL
ALK PHOS: 68 U/L (ref 38–126)
ALT: 25 U/L (ref 14–54)
ANION GAP: 6 (ref 5–15)
AST: 28 U/L (ref 15–41)
Albumin: 4 g/dL (ref 3.5–5.0)
BILIRUBIN TOTAL: 0.4 mg/dL (ref 0.3–1.2)
BUN: 9 mg/dL (ref 6–20)
CO2: 22 mmol/L (ref 22–32)
Calcium: 9.2 mg/dL (ref 8.9–10.3)
Chloride: 108 mmol/L (ref 101–111)
Creatinine, Ser: 0.78 mg/dL (ref 0.44–1.00)
GFR calc non Af Amer: >60 mL/min (ref >60)
Glucose, Bld: 127 mg/dL — ABNORMAL HIGH (ref 65–99)
POTASSIUM: 3.4 mmol/L — AB (ref 3.5–5.1)
SODIUM: 136 mmol/L (ref 135–145)
TOTAL PROTEIN: 6.8 g/dL (ref 6.5–8.1)

## 2015-06-03 LAB — HCG, QUANTITATIVE, PREGNANCY: hCG, Beta Chain, Quant, S: 11596 m[IU]/mL — ABNORMAL HIGH (ref <5)

## 2015-06-03 LAB — ABO/RH: ABO/RH(D): A POS

## 2015-06-03 LAB — WET PREP, GENITAL
Clue Cells Wet Prep HPF POC: NONE SEEN
Trich, Wet Prep: NONE SEEN
Yeast Wet Prep HPF POC: NONE SEEN

## 2015-06-03 MED ORDER — METOCLOPRAMIDE HCL 5 MG/ML IJ SOLN
10.0000 mg | Freq: Once | INTRAMUSCULAR | Status: AC
  Administered 2015-06-03: 10 mg via INTRAVENOUS
  Filled 2015-06-03: qty 2

## 2015-06-03 MED ORDER — LACTATED RINGERS IV BOLUS (SEPSIS)
1000.0000 mL | Freq: Once | INTRAVENOUS | Status: AC
  Administered 2015-06-03: 1000 mL via INTRAVENOUS

## 2015-06-03 MED ORDER — LORAZEPAM 2 MG/ML IJ SOLN
1.0000 mg | Freq: Once | INTRAMUSCULAR | Status: AC
  Administered 2015-06-03: 1 mg via INTRAVENOUS
  Filled 2015-06-03: qty 0.5

## 2015-06-03 MED ORDER — FAMOTIDINE IN NACL 20-0.9 MG/50ML-% IV SOLN
20.0000 mg | Freq: Once | INTRAVENOUS | Status: AC
  Administered 2015-06-03: 20 mg via INTRAVENOUS
  Filled 2015-06-03: qty 50

## 2015-06-03 NOTE — MAU Provider Note (Signed)
History     CSN: [id number]  Arrival date and time: 06/03/15 2135   First Provider Initiated Contact with Patient 06/03/15 2207      Chief Complaint  Patient presents with  . Emesis During Pregnancy  . Abdominal Cramping   HPI  Ms. Julia [name] is a 29 y.o. [phone]  at [redacted]w[redacted]d who presents to MAU today with complaint of N/V and abdominal cramping. The patient states N/V throughout the pregnancy but worse x 1 week. She states limited intake and emesis after most intake today. She denies diarrhea, fever, UTI symptoms or vaginal bleeding. She also has complaint of mild lower abdominal cramping over the last few days. She rates pain at 4/10 now. She has not taken anything for nausea or pain.   OB History    Gravida Para Term Preterm AB TAB SAB Ectopic Multiple Living   4 2 2  0 1 0 1 0 0 2      Past Medical History  Diagnosis Date  . Chlamydia 2007  . Infection   . Anxiety   . Kidney stones   . GERD (gastroesophageal reflux disease)   . Back pain   . Arthritis   . Depression   . Migraine   . Dyspepsia   . Sinusitis   . Right sided weakness     Past Surgical History  Procedure Laterality Date  . Dilation and curettage of uterus    . Colonoscopy  2012  . Upper gastrointestinal endoscopy  2012  . Cholecystectomy N/A 04/15/2013    Procedure: LAPAROSCOPIC CHOLECYSTECTOMY;  Surgeon: Shelly Rubenstein, MD;  Location: [facility] OR;  Service: General;  Laterality: N/A;  . Esophagogastroduodenoscopy (egd) with propofol N/A 04/02/2014    Procedure: ESOPHAGOGASTRODUODENOSCOPY (EGD) WITH PROPOFOL;  Surgeon: Willis Modena, MD;  Location: [facility];  Service: Endoscopy;  Laterality: N/A;  . Eus N/A 04/02/2014    Procedure: ESOPHAGEAL ENDOSCOPIC ULTRASOUND (EUS) RADIAL;  Surgeon: Willis Modena, MD;  Location: [facility];  Service: Endoscopy;  Laterality: N/A;  . Esophageal manometry N/A 03/02/2015    Procedure: ESOPHAGEAL MANOMETRY (EM);  Surgeon: Willis Modena, MD;  Location: [facility];  Service: Endoscopy;  Laterality: N/A;    Family History  Problem Relation Age of Onset  . Anesthesia problems Neg Hx   . Diabetes type II Mother   . Hypertension Mother   . Diabetes Mother   . Hyperlipidemia Father   . Hypertension Father     History  Substance Use Topics  . Smoking status: Never Smoker   . Smokeless tobacco: Never Used  . Alcohol Use: No    Allergies: No Known Allergies  Prescriptions prior to admission  Medication Sig Dispense Refill Last Dose  . acetaminophen (TYLENOL) 500 MG tablet Take 1,000 mg by mouth every 6 (six) hours as needed for headache.   Past Month at Unknown time  . omeprazole (PRILOSEC) 20 MG capsule Take 20 mg by mouth daily as needed (For heartburn.).   Past Month at Unknown time  . ondansetron (ZOFRAN) 4 MG tablet Take 1 tablet by mouth every 6 (six) hours as needed for nausea.   0 Past Month at Unknown time    Review of Systems  Constitutional: Negative for fever and malaise/fatigue.  Gastrointestinal: Positive for nausea, vomiting and abdominal pain. Negative for diarrhea and constipation.  Genitourinary: Negative for dysuria, urgency and frequency.       Neg - vaginal bleeding, abnormal discharge   Physical Exam   Blood pressure 115/80, pulse 94,  temperature 98.4 F (36.9 C), temperature source Oral, resp. rate 16, height 5' (1.524 m), weight 138 lb 6.4 oz (62.778 kg), last menstrual period 04/21/2015, SpO2 100 %.  Physical Exam  Nursing note and vitals reviewed. Constitutional: She is oriented to person, place, and time. She appears well-developed and well-nourished. No distress.  HENT:  Head: Normocephalic and atraumatic.  Cardiovascular: Normal rate.   Respiratory: Effort normal.  GI: Soft. Bowel sounds are normal. She exhibits no distension and no mass. There is no tenderness. There is no rebound and no guarding.  Genitourinary: Uterus is enlarged (slightly) and tender (mild). Cervix exhibits no motion  tenderness, no discharge and no friability. Right adnexum displays tenderness (mild). Right adnexum displays no mass. Left adnexum displays tenderness (mild). Left adnexum displays no mass. No bleeding in the vagina. Vaginal discharge (scant mucus discharge noted) found.  Neurological: She is alert and oriented to person, place, and time.  Skin: Skin is warm and dry. No erythema.  Psychiatric: She has a normal mood and affect.   Results for orders placed or performed during the hospital encounter of 06/03/15 (from the past 24 hour(s))  Urinalysis, Routine w reflex microscopic (not at [facility])     Status: Abnormal   Collection Time: 06/03/15  9:51 PM  Result Value Ref Range   Color, Urine YELLOW YELLOW   APPearance CLEAR CLEAR   Specific Gravity, Urine >1.030 (H) 1.005 - 1.030   pH 6.0 5.0 - 8.0   Glucose, UA NEGATIVE NEGATIVE mg/dL   Hgb urine dipstick NEGATIVE NEGATIVE   Bilirubin Urine NEGATIVE NEGATIVE   Ketones, ur 15 (A) NEGATIVE mg/dL   Protein, ur NEGATIVE NEGATIVE mg/dL   Urobilinogen, UA 2.0 (H) 0.0 - 1.0 mg/dL   Nitrite NEGATIVE NEGATIVE   Leukocytes, UA NEGATIVE NEGATIVE  Wet prep, genital     Status: Abnormal   Collection Time: 06/03/15 10:15 PM  Result Value Ref Range   Yeast Wet Prep HPF POC NONE SEEN NONE SEEN   Trich, Wet Prep NONE SEEN NONE SEEN   Clue Cells Wet Prep HPF POC NONE SEEN NONE SEEN   WBC, Wet Prep HPF POC FEW (A) NONE SEEN  CBC with Differential/Platelet     Status: Abnormal   Collection Time: 06/03/15 10:25 PM  Result Value Ref Range   WBC 9.1 4.0 - 10.5 K/uL   RBC 4.08 3.87 - 5.11 MIL/uL   Hemoglobin 10.9 (L) 12.0 - 15.0 g/dL   HCT 16.1 (L) 09.6 - 04.5 %   MCV 79.9 78.0 - 100.0 fL   MCH 26.7 26.0 - 34.0 pg   MCHC 33.4 30.0 - 36.0 g/dL   RDW 40.9 81.1 - 91.4 %   Platelets 270 150 - 400 K/uL   Neutrophils Relative % 58 43 - 77 %   Neutro Abs 5.2 1.7 - 7.7 K/uL   Lymphocytes Relative 34 12 - 46 %   Lymphs Abs 3.1 0.7 - 4.0 K/uL   Monocytes  Relative 7 3 - 12 %   Monocytes Absolute 0.7 0.1 - 1.0 K/uL   Eosinophils Relative 1 0 - 5 %   Eosinophils Absolute 0.1 0.0 - 0.7 K/uL   Basophils Relative 0 0 - 1 %   Basophils Absolute 0.0 0.0 - 0.1 K/uL  ABO/Rh     Status: None   Collection Time: 06/03/15 10:25 PM  Result Value Ref Range   ABO/RH(D) A POS   hCG, quantitative, pregnancy     Status: Abnormal   Collection  Time: 06/03/15 10:25 PM  Result Value Ref Range   hCG, Beta Chain, Quant, S 11596 (H) <5 mIU/mL  Comprehensive metabolic panel     Status: Abnormal   Collection Time: 06/03/15 10:25 PM  Result Value Ref Range   Sodium 136 135 - 145 mmol/L   Potassium 3.4 (L) 3.5 - 5.1 mmol/L   Chloride 108 101 - 111 mmol/L   CO2 22 22 - 32 mmol/L   Glucose, Bld 127 (H) 65 - 99 mg/dL   BUN 9 6 - 20 mg/dL   Creatinine, Ser 9.60 0.44 - 1.00 mg/dL   Calcium 9.2 8.9 - 45.4 mg/dL   Total Protein 6.8 6.5 - 8.1 g/dL   Albumin 4.0 3.5 - 5.0 g/dL   AST 28 15 - 41 U/L   ALT 25 14 - 54 U/L   Alkaline Phosphatase 68 38 - 126 U/L   Total Bilirubin 0.4 0.3 - 1.2 mg/dL   GFR calc non Af Amer >60 >60 mL/min   GFR calc Af Amer >60 >60 mL/min   Anion gap 6 5 - 15   US Ob Comp Less 14 Wks  06/03/2015   CLINICAL DATA:  Patient with abdominal cramping.  EXAM: OBSTETRIC <14 WK [place] AND TRANSVAGINAL OB US  TECHNIQUE: Both transabdominal and transvaginal ultrasound examinations were performed for complete evaluation of the gestation as well as the maternal uterus, adnexal regions, and pelvic cul-de-sac. Transvaginal technique was performed to assess early pregnancy.  COMPARISON:  None.  FINDINGS: Intrauterine gestational sac: Visualized/normal in shape.  Yolk sac:  Present  Embryo:  Not present  Cardiac Activity: Not present  MSD: 8 mm  mm   5 w   3  d  Maternal uterus/adnexae: The right ovary is unremarkable. No subchorionic hemorrhage. The left ovary is not visualized. Trace free fluid in the pelvis.  IMPRESSION: Probable early intrauterine gestational  sac and yolk sac with no fetal pole or cardiac activity yet visualized. Recommend follow-up quantitative B-HCG levels and follow-up US in 14 days to confirm and assess viability. This recommendation follows SRU consensus guidelines: Diagnostic Criteria for Nonviable Pregnancy Early in the First Trimester. Malva Limes Med 2013; 098:1191-47.   Electronically Signed   By: Annia Belt M.D.   On: 06/03/2015 23:34   US Ob Transvaginal  06/03/2015   CLINICAL DATA:  Patient with abdominal cramping.  EXAM: OBSTETRIC <14 WK [place] AND TRANSVAGINAL OB US  TECHNIQUE: Both transabdominal and transvaginal ultrasound examinations were performed for complete evaluation of the gestation as well as the maternal uterus, adnexal regions, and pelvic cul-de-sac. Transvaginal technique was performed to assess early pregnancy.  COMPARISON:  None.  FINDINGS: Intrauterine gestational sac: Visualized/normal in shape.  Yolk sac:  Present  Embryo:  Not present  Cardiac Activity: Not present  MSD: 8 mm  mm   5 w   3  d  Maternal uterus/adnexae: The right ovary is unremarkable. No subchorionic hemorrhage. The left ovary is not visualized. Trace free fluid in the pelvis.  IMPRESSION: Probable early intrauterine gestational sac and yolk sac with no fetal pole or cardiac activity yet visualized. Recommend follow-up quantitative B-HCG levels and follow-up US in 14 days to confirm and assess viability. This recommendation follows SRU consensus guidelines: Diagnostic Criteria for Nonviable Pregnancy Early in the First Trimester. Malva Limes Med 2013; 829:5621-30.   Electronically Signed   By: Annia Belt M.D.   On: 06/03/2015 23:34    MAU Course  Procedures None  MDM +  UPT UA, wet prep, GC/chlamydia, CBC, ABO/Rh, quant hCG, HIV, RPR and [place] today to rule out ectopic pregnancy IV LR with Reglan and pepcid Patient states Phenergan gives her anxiety attacks Patient returned from [place] stating that she felt like she was going to have an anxiety attack. She  states that she usually takes Lorazepam for anxiety attacks Discussed patient with Dr. Clearance Coots. He states that he is ok with patient having Lorazepam.  1 mg Lorazepam given  Patient reports improvement in anxiety and resolution of nausea. No emesis while in MAU.  Discussed risk vs benefits of Zofran in the first trimester. Patient states that Zofran gives her anxiety as well. Declines Rx at this time.  Assessment and Plan  A: IUGS and YS at [redacted]w[redacted]d Nausea and vomiting in pregnancy prior to [redacted] weeks gestation Anxiety   P: Discharge home Instructions for Unisom and B6 for N/V given  First trimester precautions discussed Patient advised to follow-up with Dr. Clearance Coots as scheduled for routine prenatal care Patient may return to MAU as needed or if her condition were to change or worsen   Marny Lowenstein, PA-C  06/04/2015, 12:55 AM

## 2015-06-03 NOTE — Telephone Encounter (Signed)
Patient has recently found out she is pregnant. Patient states she is having a lot of nausea and vomiting. Patient states she is unable to keep anything down. Patient advised to be evaluated at MAU. Patient verbalized understanding. Patient also asked if it was okay for her to continue taking her omeprazole. Patient advised that was okay.

## 2015-06-03 NOTE — MAU Note (Signed)
Lower abdominal cramping since yesterday. Denies vaginal bleeding. Increased urinary frequency.  Vomited 6x in the last 24 hours.

## 2015-06-03 NOTE — MAU Note (Signed)
1mg  of Ativan given. 1mg  of ativan wasted in sink by Robbie Lis, RN; waste witnessed by Judeth Horn RN

## 2015-06-04 ENCOUNTER — Telehealth: Payer: Self-pay | Admitting: *Deleted

## 2015-06-04 ENCOUNTER — Other Ambulatory Visit: Payer: Self-pay | Admitting: Obstetrics

## 2015-06-04 ENCOUNTER — Encounter (HOSPITAL_COMMUNITY): Payer: Self-pay | Admitting: Medical

## 2015-06-04 DIAGNOSIS — O219 Vomiting of pregnancy, unspecified: Secondary | ICD-10-CM

## 2015-06-04 DIAGNOSIS — F419 Anxiety disorder, unspecified: Secondary | ICD-10-CM

## 2015-06-04 LAB — HIV ANTIBODY (ROUTINE TESTING W REFLEX): HIV Screen 4th Generation wRfx: NONREACTIVE

## 2015-06-04 LAB — RPR: RPR: NONREACTIVE

## 2015-06-04 MED ORDER — ALPRAZOLAM 0.25 MG PO TABS: 0.2500 mg | ORAL_TABLET | Freq: Two times a day (BID) | ORAL | Status: DC

## 2015-06-04 NOTE — Telephone Encounter (Signed)
Patient states she went to MAU yesterday due to nausea and vomiting. Patient was given Reglan. Patient states she started having a panic attack. Patient states she is still anxious. Patient states she was given some ativan in the emergency room last night. Patient wants to know if there is anything she can have for her anxiety.

## 2015-06-04 NOTE — Telephone Encounter (Signed)
Per Dr. Clearance Coots patient okay to come by and pick up prescription for Xanax. Patient notified and will com by office to pick up prescription.

## 2015-06-04 NOTE — Discharge Instructions (Signed)
Morning Sickness °Morning sickness is when you feel sick to your stomach (nauseous) during pregnancy. You may feel sick to your stomach and throw up (vomit). You may feel sick in the morning, but you can feel this way any time of day. Some women feel very sick to their stomach and cannot stop throwing up (hyperemesis gravidarum). °HOME CARE °· Only take medicines as told by your doctor. °· Take multivitamins as told by your doctor. Taking multivitamins before getting pregnant can stop or lessen the harshness of morning sickness. °· Eat dry toast or unsalted crackers before getting out of bed. °· Eat 5 to 6 small meals a day. °· Eat dry and bland foods like rice and baked potatoes. °· Do not drink liquids with meals. Drink between meals. °· Do not eat greasy, fatty, or spicy foods. °· Have someone cook for you if the smell of food causes you to feel sick or throw up. °· If you feel sick to your stomach after taking prenatal vitamins, take them at night or with a snack. °· Eat protein when you need a snack (nuts, yogurt, cheese). °· Eat unsweetened gelatins for dessert. °· Wear a bracelet used for sea sickness (acupressure wristband). °· Go to a doctor that puts thin needles into certain body points (acupuncture) to improve how you feel. °· Do not smoke. °· Use a humidifier to keep the air in your house free of odors. °· Get lots of fresh air. °GET HELP IF: °· You need medicine to feel better. °· You feel dizzy or lightheaded. °· You are losing weight. °GET HELP RIGHT AWAY IF:  °· You feel very sick to your stomach and cannot stop throwing up. °· You pass out (faint). °MAKE SURE YOU: °· Understand these instructions. °· Will watch your condition. °· Will get help right away if you are not doing well or get worse. °Document Released: 01/05/2005 Document Revised: 12/03/2013 Document Reviewed: 05/15/2013 °Exit[employer]® Patient Information ©2015 [employer]. This information is not intended to replace advice given to you by  your health care provider. Make sure you discuss any questions you have with your health care provider. ° °Eating Plan for Hyperemesis Gravidarum °Severe cases of hyperemesis gravidarum can lead to dehydration and malnutrition. The hyperemesis eating plan is one way to lessen the symptoms of nausea and vomiting. It is often used with prescribed medicines to control your symptoms.  °WHAT CAN I DO TO RELIEVE MY SYMPTOMS? °Listen to your body. Everyone is different and has different preferences. Find what works best for you. Some of the following things may help: °· Eat and drink slowly. °· Eat 5-6 small meals daily instead of 3 large meals.   °· Eat crackers before you get out of bed in the morning.   °· Starchy foods are usually well tolerated (such as cereal, toast, bread, potatoes, pasta, rice, and pretzels).   °· Ginger may help with nausea. Add ¼ tsp ground ginger to hot tea or choose ginger tea.   °· Try drinking 100% fruit juice or an electrolyte drink. °· Continue to take your prenatal vitamins as directed by your health care provider. If you are having trouble taking your prenatal vitamins, talk with your health care provider about different options. °· Include at least 1 serving of protein with your meals and snacks (such as meats or poultry, beans, nuts, eggs, or yogurt). Try eating a protein-rich snack before bed (such as cheese and crackers or a half turkey or peanut butter sandwich). °WHAT THINGS SHOULD I   TO REDUCE MY SYMPTOMS? The following things may help reduce your symptoms:  Avoid foods with strong smells. Try eating meals in well-ventilated areas that are free of odors.  Avoid drinking water or other beverages with meals. Try not to drink anything less than 30 minutes before and after meals.  Avoid drinking more than 1 cup of fluid at a time.  Avoid fried or high-fat foods, such as butter and cream sauces.  Avoid spicy foods.  Avoid skipping meals the best you can. Nausea can be  more intense on an empty stomach. If you cannot tolerate food at that time, do not force it. Try sucking on ice chips or other frozen items and make up the calories later.  Avoid lying down within 2 hours after eating. Document Released: 09/25/2007 Document Revised: 12/03/2013 Document Reviewed: 10/02/2013 [facility] Patient Information 2015 [place], [place]. This information is not intended to replace advice given to you by your health care provider. Make sure you discuss any questions you have with your health care provider.  For the nausea you can take an over the counter medication known as UNISOM in combination with VITAMIN B6 50mg  twice a day. This is the same as a prescription medication known as Diclegis, and is the only known safe medication to take in early pregnancy. There are other medications available if this does now work. However, those medications are not known to be as safe.

## 2015-06-05 LAB — GC/CHLAMYDIA PROBE AMP (~~LOC~~) NOT AT ARMC
Chlamydia: NEGATIVE
Neisseria Gonorrhea: NEGATIVE

## 2015-06-12 ENCOUNTER — Other Ambulatory Visit: Payer: Self-pay | Admitting: Obstetrics

## 2015-06-12 DIAGNOSIS — O219 Vomiting of pregnancy, unspecified: Secondary | ICD-10-CM

## 2015-06-12 MED ORDER — DOXYLAMINE-PYRIDOXINE 10-10 MG PO TBEC: 1.0000 | DELAYED_RELEASE_TABLET | ORAL | Status: DC

## 2015-06-30 ENCOUNTER — Encounter (HOSPITAL_COMMUNITY): Payer: Self-pay | Admitting: *Deleted

## 2015-06-30 ENCOUNTER — Inpatient Hospital Stay (HOSPITAL_COMMUNITY)
Admission: AD | Admit: 2015-06-30 | Discharge: 2015-06-30 | Disposition: A | Discharge status: Home / Self Care | Payer: Medicaid Other | Source: Ambulatory Visit | Attending: Obstetrics | Admitting: Obstetrics

## 2015-06-30 ENCOUNTER — Telehealth: Payer: Self-pay | Admitting: *Deleted

## 2015-06-30 DIAGNOSIS — R1030 Lower abdominal pain, unspecified: Secondary | ICD-10-CM | POA: Diagnosis not present

## 2015-06-30 DIAGNOSIS — O9989 Other specified diseases and conditions complicating pregnancy, childbirth and the puerperium: Secondary | ICD-10-CM | POA: Diagnosis not present

## 2015-06-30 DIAGNOSIS — M545 Low back pain: Secondary | ICD-10-CM | POA: Diagnosis not present

## 2015-06-30 DIAGNOSIS — W1830XA Fall on same level, unspecified, initial encounter: Secondary | ICD-10-CM | POA: Diagnosis not present

## 2015-06-30 DIAGNOSIS — W19XXXA Unspecified fall, initial encounter: Secondary | ICD-10-CM

## 2015-06-30 DIAGNOSIS — O9A211 Injury, poisoning and certain other consequences of external causes complicating pregnancy, first trimester: Secondary | ICD-10-CM

## 2015-06-30 DIAGNOSIS — W010XXA Fall on same level from slipping, tripping and stumbling without subsequent striking against object, initial encounter: Secondary | ICD-10-CM | POA: Diagnosis not present

## 2015-06-30 DIAGNOSIS — Z3A16 16 weeks gestation of pregnancy: Secondary | ICD-10-CM | POA: Diagnosis not present

## 2015-06-30 DIAGNOSIS — S3991XA Unspecified injury of abdomen, initial encounter: Secondary | ICD-10-CM

## 2015-06-30 DIAGNOSIS — S3992XA Unspecified injury of lower back, initial encounter: Secondary | ICD-10-CM

## 2015-06-30 HISTORY — DX: Other complications of anesthesia, initial encounter: T88.59XA

## 2015-06-30 HISTORY — DX: Other specified postprocedural states: Z98.890

## 2015-06-30 HISTORY — DX: Adverse effect of unspecified anesthetic, initial encounter: T41.45XA

## 2015-06-30 HISTORY — DX: Nausea with vomiting, unspecified: R11.2

## 2015-06-30 LAB — URINALYSIS, ROUTINE W REFLEX MICROSCOPIC
Bilirubin Urine: NEGATIVE
Glucose, UA: NEGATIVE mg/dL
Hgb urine dipstick: NEGATIVE
Ketones, ur: NEGATIVE mg/dL
Leukocytes, UA: NEGATIVE
NITRITE: NEGATIVE
Protein, ur: NEGATIVE mg/dL
SPECIFIC GRAVITY, URINE: 1.025 (ref 1.005–1.030)
Urobilinogen, UA: 0.2 mg/dL (ref 0.0–1.0)
pH: 6 (ref 5.0–8.0)

## 2015-06-30 NOTE — MAU Provider Note (Signed)
History     CSN: [id number]  Arrival date and time: 06/30/15 2011   First Provider Initiated Contact with Patient 06/30/15 2037      No chief complaint on file.  HPI  Ms. Julia HesselbachMaria [name] is a 29 y.o. [phone] at 3448w3d who presents to MAU today with complaint of a fall at 0800 this morning. She states that she slipped at home and fell on her lower back. She now has low back pain rated at 6/10 and mild lower abdominal pain. She denies vaginal bleeding, LOF or head trauma. She has not taken anything for pain.   OB History    Gravida Para Term Preterm AB TAB SAB Ectopic Multiple Living   4 2 2  0 1 0 1 0 0 2      Past Medical History  Diagnosis Date  . Chlamydia 2007  . Infection   . Anxiety   . Kidney stones   . GERD (gastroesophageal reflux disease)   . Back pain   . Arthritis   . Depression   . Migraine   . Dyspepsia   . Sinusitis   . Right sided weakness   . Complication of anesthesia   . PONV (postoperative nausea and vomiting)     Past Surgical History  Procedure Laterality Date  . Dilation and curettage of uterus    . Colonoscopy  2012  . Upper gastrointestinal endoscopy  2012  . Cholecystectomy N/A 04/15/2013    Procedure: LAPAROSCOPIC CHOLECYSTECTOMY;  Surgeon: Shelly Rubensteinouglas A Blackman, MD;  Location: [facility] OR;  Service: General;  Laterality: N/A;  . Esophagogastroduodenoscopy (egd) with propofol N/A 04/02/2014    Procedure: ESOPHAGOGASTRODUODENOSCOPY (EGD) WITH PROPOFOL;  Surgeon: Willis ModenaWilliam Outlaw, MD;  Location: [facility];  Service: Endoscopy;  Laterality: N/A;  . Eus N/A 04/02/2014    Procedure: ESOPHAGEAL ENDOSCOPIC ULTRASOUND (EUS) RADIAL;  Surgeon: Willis ModenaWilliam Outlaw, MD;  Location: [facility];  Service: Endoscopy;  Laterality: N/A;  . Esophageal manometry N/A 03/02/2015    Procedure: ESOPHAGEAL MANOMETRY (EM);  Surgeon: Willis ModenaWilliam Outlaw, MD;  Location: [facility];  Service: Endoscopy;  Laterality: N/A;    Family History  Problem Relation Age of Onset  .  Anesthesia problems Neg Hx   . Diabetes type II Mother   . Hypertension Mother   . Diabetes Mother   . Hyperlipidemia Father   . Hypertension Father     History  Substance Use Topics  . Smoking status: Never Smoker   . Smokeless tobacco: Never Used  . Alcohol Use: No    Allergies: No Known Allergies  Prescriptions prior to admission  Medication Sig Dispense Refill Last Dose  . acetaminophen (TYLENOL) 500 MG tablet Take 1,000 mg by mouth every 6 (six) hours as needed for headache.   Past Week at Unknown time  . ALPRAZolam (XANAX) 0.25 MG tablet Take 1 tablet (0.25 mg total) by mouth 2 (two) times daily. 30 tablet 0 More than a month at Unknown time  . Doxylamine-Pyridoxine (DICLEGIS) 10-10 MG TBEC Take 1 tablet by mouth as directed. 1 tab in AM, 1 tab mid afternoon 2 tabs at bedtime. Max dose 4 tabs daily. 100 tablet 8 More than a month at Unknown time  . omeprazole (PRILOSEC) 20 MG capsule Take 20 mg by mouth daily as needed (For heartburn.).   More than a month at Unknown time  . ondansetron (ZOFRAN) 4 MG tablet Take 1 tablet by mouth every 6 (six) hours as needed for nausea.   0 More than a month at  Unknown time    Review of Systems  Constitutional: Negative for fever and malaise/fatigue.  Gastrointestinal: Positive for abdominal pain. Negative for nausea, vomiting, diarrhea and constipation.  Genitourinary: Negative for dysuria, urgency and frequency.       Neg - vaginal bleeding   Physical Exam   Blood pressure 104/60, pulse 85, temperature 98.3 F (36.8 C), temperature source Oral, resp. rate 18, height 4' 10.2" (1.478 m), weight 138 lb (62.596 kg), last menstrual period 04/21/2015, SpO2 99 %.  Physical Exam  Nursing note and vitals reviewed. Constitutional: She is oriented to person, place, and time. She appears well-developed and well-nourished. No distress.  HENT:  Head: Normocephalic and atraumatic.  Cardiovascular: Normal rate.   Respiratory: Effort normal.  GI:  Soft. She exhibits no distension and no mass. There is no tenderness. There is no rebound and no guarding.  Musculoskeletal:       Lumbar back: She exhibits tenderness. She exhibits no bony tenderness, no swelling, no edema, no pain and no spasm.  Neurological: She is alert and oriented to person, place, and time.  Skin: Skin is warm and dry. No erythema.  Psychiatric: She has a normal mood and affect.   Results for orders placed or performed during the hospital encounter of 06/30/15 (from the past 24 hour(s))  Urinalysis, Routine w reflex microscopic (not at [facility])     Status: None   Collection Time: 06/30/15  8:21 PM  Result Value Ref Range   Color, Urine YELLOW YELLOW   APPearance CLEAR CLEAR   Specific Gravity, Urine 1.025 1.005 - 1.030   pH 6.0 5.0 - 8.0   Glucose, UA NEGATIVE NEGATIVE mg/dL   Hgb urine dipstick NEGATIVE NEGATIVE   Bilirubin Urine NEGATIVE NEGATIVE   Ketones, ur NEGATIVE NEGATIVE mg/dL   Protein, ur NEGATIVE NEGATIVE mg/dL   Urobilinogen, UA 0.2 0.0 - 1.0 mg/dL   Nitrite NEGATIVE NEGATIVE   Leukocytes, UA NEGATIVE NEGATIVE    MAU Course  Procedures None  MDM UA today Patient request attempt to obtain FHTs with doppler. Attempted without success. Patient aware that this does not indicate any concern for the pregnancy as she is too early for doppler  Assessment and Plan  A: SIUP at [redacted]w[redacted]d Fall at home Back pain  P: Discharge home Recommended Tylenol PRN for pain Ice to affected area Increased rest Warning signs for worsening condition discussed Patient advised to follow-up with Dr. Clearance Coots as scheduled for routine prenatal care Patient may return to MAU as needed or if her condition were to change or worsen   Marny Lowenstein, PA-C  06/30/2015, 8:37 PM

## 2015-06-30 NOTE — Discharge Instructions (Signed)
What Do I Need to Know About Injuries During Pregnancy? °Injuries can happen during pregnancy. Minor falls and accidents usually do not harm you or your baby. However, any injury should be reported to your doctor. °WHAT CAN I DO TO PROTECT MYSELF FROM INJURIES? °· Remove rugs and loose objects on the floor. °· Wear comfortable shoes that have a good grip. Do not wear high-heeled shoes. °· Always wear your seat belt. The lap belt should be below your belly. Always practice safe driving. °· Do not ride on a motorcycle. °· Do not participate in high-impact activities or sports. °· Avoid: °¨ Walking on wet or slippery floors. °¨ Fires. °¨ Starting fires. °¨ Lifting heavy pots of boiling or hot liquids. °¨ Fixing electrical problems. °· Only take medicine as told by your doctor. °· Know your blood type and the blood type of the baby's father. °· Call your local emergency services (911 in the [place] if you are a victim of domestic violence or assault. For help and support, contact the National Domestic Violence Hotline. °WHEN SHOULD I GET HELP RIGHT AWAY? °· You fall on your belly or have any high-impact accident or injury. °· You have been a victim of domestic violence or any kind of violence. °· You have been in a car accident. °· You have bleeding from your vagina. °· Fluid is leaking from your vagina. °· You start to have belly cramping (contractions) or pain. °· You feel weak or pass out (faint). °· You start to throw up (vomit) after an injury. °· You have been burned. °· You have a stiff neck or neck pain. °· You get a headache or have vision problems after an injury. °· You do not feel the baby move or the baby is not moving as much as normal. °Document Released: 12/31/2010 Document Revised: 04/14/2014 Document Reviewed: 09/04/2013 °ExitCare® Patient Information ©2015 [employer]. This information is not intended to replace advice given to you by your health care provider. Make sure you discuss any questions you  have with your health care provider. ° °

## 2015-06-30 NOTE — MAU Note (Signed)
Pt slipped on some water that was on the floor and fell on her back. Having some lower abdominal cramping and back pain. Denies vaginal bleeding. Did not take anything for pain

## 2015-06-30 NOTE — Telephone Encounter (Signed)
Patient is pregnant and is having a problem with acid reflux. What can she do? 06/30/2015 1:26 Call to patient- advised she can take OTC- Mylanta,TUMS, Zantac or Pepcid.

## 2015-07-09 ENCOUNTER — Ambulatory Visit (INDEPENDENT_AMBULATORY_CARE_PROVIDER_SITE_OTHER): Payer: Medicaid Other

## 2015-07-09 ENCOUNTER — Encounter: Payer: Self-pay | Admitting: Certified Nurse Midwife

## 2015-07-09 ENCOUNTER — Other Ambulatory Visit: Payer: Self-pay | Admitting: Certified Nurse Midwife

## 2015-07-09 ENCOUNTER — Ambulatory Visit (INDEPENDENT_AMBULATORY_CARE_PROVIDER_SITE_OTHER): Payer: Medicaid Other | Admitting: Certified Nurse Midwife

## 2015-07-09 VITALS — BP 116/74 | HR 90 | Temp 98.3°F | Wt 136.0 lb

## 2015-07-09 DIAGNOSIS — O219 Vomiting of pregnancy, unspecified: Secondary | ICD-10-CM

## 2015-07-09 DIAGNOSIS — Z3481 Encounter for supervision of other normal pregnancy, first trimester: Secondary | ICD-10-CM | POA: Diagnosis not present

## 2015-07-09 DIAGNOSIS — O3680X1 Pregnancy with inconclusive fetal viability, fetus 1: Secondary | ICD-10-CM

## 2015-07-09 DIAGNOSIS — O2690 Pregnancy related conditions, unspecified, unspecified trimester: Secondary | ICD-10-CM | POA: Diagnosis not present

## 2015-07-09 DIAGNOSIS — Z8719 Personal history of other diseases of the digestive system: Secondary | ICD-10-CM

## 2015-07-09 LAB — POCT URINALYSIS DIPSTICK
Bilirubin, UA: NEGATIVE
Blood, UA: NEGATIVE
GLUCOSE UA: NEGATIVE
KETONES UA: NEGATIVE
Leukocytes, UA: NEGATIVE
Nitrite, UA: NEGATIVE
PH UA: 5
Protein, UA: NEGATIVE
Spec Grav, UA: 1.025
Urobilinogen, UA: NEGATIVE

## 2015-07-09 LAB — US OB TRANSVAGINAL

## 2015-07-09 LAB — TSH: TSH: 0.757 u[IU]/mL (ref 0.350–4.500)

## 2015-07-09 MED ORDER — RANITIDINE HCL 150 MG PO TABS: 150.0000 mg | ORAL_TABLET | Freq: Two times a day (BID) | ORAL | Status: DC

## 2015-07-09 MED ORDER — DOXYLAMINE-PYRIDOXINE 10-10 MG PO TBEC: DELAYED_RELEASE_TABLET | ORAL | Status: DC

## 2015-07-09 NOTE — Progress Notes (Signed)
Subjective:    Julia [name] is being seen today for her first obstetrical visit.  This is not a planned pregnancy. She is at [redacted]w[redacted]d gestation. Her obstetrical history is significant for none. Relationship with FOB: significant other, living together. Patient does intend to breast feed. Pregnancy history fully reviewed.  Has problems with gastritis, N&V, and reflux.    The information documented in the HPI was reviewed and verified.  Menstrual History: OB History    Gravida Para Term Preterm AB TAB SAB Ectopic Multiple Living   4 2 2  0 1 0 1 0 0 2     Proven to 7#6oz.  Menarche age: 28  Patient's last menstrual period was 04/21/2015.  Has had ultrasound for dating.     Past Medical History  Diagnosis Date  . Chlamydia 2007  . Infection   . Anxiety   . Kidney stones   . GERD (gastroesophageal reflux disease)   . Back pain   . Arthritis   . Depression   . Migraine   . Dyspepsia   . Sinusitis   . Right sided weakness   . Complication of anesthesia   . PONV (postoperative nausea and vomiting)     Past Surgical History  Procedure Laterality Date  . Dilation and curettage of uterus    . Colonoscopy  2012  . Upper gastrointestinal endoscopy  2012  . Cholecystectomy N/A 04/15/2013    Procedure: LAPAROSCOPIC CHOLECYSTECTOMY;  Surgeon: Shelly Rubenstein, MD;  Location: [facility] OR;  Service: General;  Laterality: N/A;  . Esophagogastroduodenoscopy (egd) with propofol N/A 04/02/2014    Procedure: ESOPHAGOGASTRODUODENOSCOPY (EGD) WITH PROPOFOL;  Surgeon: Willis Modena, MD;  Location: [facility];  Service: Endoscopy;  Laterality: N/A;  . Eus N/A 04/02/2014    Procedure: ESOPHAGEAL ENDOSCOPIC ULTRASOUND (EUS) RADIAL;  Surgeon: Willis Modena, MD;  Location: [facility];  Service: Endoscopy;  Laterality: N/A;  . Esophageal manometry N/A 03/02/2015    Procedure: ESOPHAGEAL MANOMETRY (EM);  Surgeon: Willis Modena, MD;  Location: [facility];  Service: Endoscopy;  Laterality: N/A;      (Not in a hospital admission) No Known Allergies  History  Substance Use Topics  . Smoking status: Never Smoker   . Smokeless tobacco: Never Used  . Alcohol Use: No    Family History  Problem Relation Age of Onset  . Anesthesia problems Neg Hx   . Diabetes type II Mother   . Hypertension Mother   . Diabetes Mother   . Hyperlipidemia Father   . Hypertension Father      Review of Systems Constitutional: negative for weight loss Gastrointestinal: + for vomiting Genitourinary:negative for genital lesions and vaginal discharge and dysuria, + vaginal spotting reported for the last few days Musculoskeletal:negative for back pain Behavioral/Psych: negative for abusive relationship, depression, illegal drug usage and tobacco use    Objective:    BP 116/74 mmHg  Pulse 90  Temp(Src) 98.3 F (36.8 C)  Wt 136 lb (61.689 kg)  LMP 04/21/2015 General Appearance:    Alert, cooperative, no distress, appears stated age  Head:    Normocephalic, without obvious abnormality, atraumatic  Eyes:    PERRL, conjunctiva/corneas clear, EOM's intact, fundi    benign, both eyes  Ears:    Normal TM's and external ear canals, both ears  Nose:   Nares normal, septum midline, mucosa normal, no drainage    or sinus tenderness  Throat:   Lips, mucosa, and tongue normal; teeth and gums normal  Neck:   Supple, symmetrical,  trachea midline, no adenopathy;    thyroid:  no enlargement/tenderness/nodules; no carotid   bruit or JVD  Back:     Symmetric, no curvature, ROM normal, no CVA tenderness  Lungs:     Clear to auscultation bilaterally, respirations unlabored  Chest Wall:    No tenderness or deformity   Heart:    Regular rate and rhythm, S1 and S2 normal, no murmur, rub   or gallop  Breast Exam:    No tenderness, masses, or nipple abnormality  Abdomen:     Soft, non-tender, bowel sounds active all four quadrants,    no masses, no organomegaly  Genitalia:    Normal female without lesion, discharge or  tenderness  Extremities:   Extremities normal, atraumatic, no cyanosis or edema  Pulses:   2+ and symmetric all extremities  Skin:   Skin color, texture, turgor normal, no rashes or lesions  Lymph nodes:   Cervical, supraclavicular, and axillary nodes normal  Neurologic:   CNII-XII intact, normal strength, sensation and reflexes    throughout      Cervix:  Long, thick, closed posterior.  Fundus below symphysis pubis.   Lab Review Urine pregnancy test Labs reviewed yes Radiologic studies reviewed yes Assessment:    Pregnancy at [redacted]w[redacted]d weeks   Nausea and vomiting in early pregnancy GERD Plan:      Prenatal vitamins.  Counseling provided regarding continued use of seat belts, cessation of alcohol consumption, smoking or use of illicit drugs; infection precautions i.e., influenza/TDAP immunizations, toxoplasmosis,CMV, parvovirus, listeria and varicella; workplace safety, exercise during pregnancy; routine dental care, safe medications, sexual activity, hot tubs, saunas, pools, travel, caffeine use, fish and methlymercury, potential toxins, hair treatments, varicose veins Weight gain recommendations per IOM guidelines reviewed: underweight/BMI< 18.5--> gain 28 - 40 lbs; normal weight/BMI 18.5 - 24.9--> gain 25 - 35 lbs; overweight/BMI 25 - 29.9--> gain 15 - 25 lbs; obese/BMI >30->gain  11 - 20 lbs Problem list reviewed and updated. FIRST/CF mutation testing/NIPT/QUAD SCREEN/fragile X/[name] population testing/Spinal muscular atrophy discussed: requested. Role of ultrasound in pregnancy discussed; fetal survey: requested. Amniocentesis discussed: not indicated. VBAC calculator score: VBAC consent form provided No orders of the defined types were placed in this encounter.   Orders Placed This Encounter  Procedures  . Culture, OB Urine  . US OB Comp Less 14 Wks    Standing Status: Future     Number of Occurrences:      Standing Expiration Date: 09/08/2016    Order Specific  Question:  Reason for Exam (SYMPTOM  OR DIAGNOSIS REQUIRED)    Answer:  f/u viability, early bleeding in pregnancy    Order Specific Question:  Preferred imaging location?    Answer:  Internal  . Obstetric panel  . Hemoglobinopathy evaluation  . HIV antibody  . Varicella zoster antibody, IgG  . Vit D  25 hydroxy (rtn osteoporosis monitoring)  . TSH  . POCT urinalysis dipstick    Follow up in 4 weeks. 50% of 30 min visit spent on counseling and coordination of care.

## 2015-07-10 ENCOUNTER — Other Ambulatory Visit: Payer: Self-pay | Admitting: Certified Nurse Midwife

## 2015-07-10 DIAGNOSIS — Z3481 Encounter for supervision of other normal pregnancy, first trimester: Secondary | ICD-10-CM

## 2015-07-10 LAB — OBSTETRIC PANEL
ANTIBODY SCREEN: NEGATIVE
Basophils Absolute: 0 10*3/uL (ref 0.0–0.1)
Basophils Relative: 0 % (ref 0–1)
Eosinophils Absolute: 0 10*3/uL (ref 0.0–0.7)
Eosinophils Relative: 0 % (ref 0–5)
HEMATOCRIT: 33.8 % — AB (ref 36.0–46.0)
Hemoglobin: 11.1 g/dL — ABNORMAL LOW (ref 12.0–15.0)
Hepatitis B Surface Ag: NEGATIVE
LYMPHS PCT: 26 % (ref 12–46)
Lymphs Abs: 1.5 10*3/uL (ref 0.7–4.0)
MCH: 26.6 pg (ref 26.0–34.0)
MCHC: 32.8 g/dL (ref 30.0–36.0)
MCV: 80.9 fL (ref 78.0–100.0)
MPV: 10 fL (ref 8.6–12.4)
Monocytes Absolute: 0.4 10*3/uL (ref 0.1–1.0)
Monocytes Relative: 6 % (ref 3–12)
NEUTROS ABS: 4 10*3/uL (ref 1.7–7.7)
Neutrophils Relative %: 68 % (ref 43–77)
Platelets: 296 10*3/uL (ref 150–400)
RBC: 4.18 MIL/uL (ref 3.87–5.11)
RDW: 14.8 % (ref 11.5–15.5)
RUBELLA: 2.11 {index} — AB (ref <0.90)
Rh Type: POSITIVE
WBC: 5.9 10*3/uL (ref 4.0–10.5)

## 2015-07-10 LAB — CULTURE, OB URINE
COLONY COUNT: NO GROWTH
ORGANISM ID, BACTERIA: NO GROWTH

## 2015-07-10 LAB — HIV ANTIBODY (ROUTINE TESTING W REFLEX): HIV 1&2 Ab, 4th Generation: NONREACTIVE

## 2015-07-10 LAB — VITAMIN D 25 HYDROXY (VIT D DEFICIENCY, FRACTURES): VIT D 25 HYDROXY: 24 ng/mL — AB (ref 30–100)

## 2015-07-10 LAB — VARICELLA ZOSTER ANTIBODY, IGG: VARICELLA IGG: 535.2 {index} — AB (ref <135.00)

## 2015-07-13 LAB — HEMOGLOBINOPATHY EVALUATION
HEMOGLOBIN OTHER: 0 %
HGB A2 QUANT: 2.3 % (ref 2.2–3.2)
Hgb A: 97.7 % (ref 96.8–97.8)
Hgb F Quant: 0 % (ref 0.0–2.0)
Hgb S Quant: 0 %

## 2015-08-06 ENCOUNTER — Other Ambulatory Visit: Payer: Self-pay | Admitting: Certified Nurse Midwife

## 2015-08-06 ENCOUNTER — Ambulatory Visit (INDEPENDENT_AMBULATORY_CARE_PROVIDER_SITE_OTHER): Payer: Medicaid Other | Admitting: Certified Nurse Midwife

## 2015-08-06 ENCOUNTER — Ambulatory Visit (INDEPENDENT_AMBULATORY_CARE_PROVIDER_SITE_OTHER): Payer: Medicaid Other

## 2015-08-06 VITALS — BP 101/69 | HR 84 | Temp 98.3°F | Wt 136.0 lb

## 2015-08-06 DIAGNOSIS — Z3481 Encounter for supervision of other normal pregnancy, first trimester: Secondary | ICD-10-CM

## 2015-08-06 DIAGNOSIS — Z3482 Encounter for supervision of other normal pregnancy, second trimester: Secondary | ICD-10-CM

## 2015-08-06 LAB — POCT URINALYSIS DIPSTICK
BILIRUBIN UA: NEGATIVE
Glucose, UA: NORMAL
KETONES UA: NEGATIVE
Leukocytes, UA: NEGATIVE
NITRITE UA: NEGATIVE
PH UA: 6
Protein, UA: NEGATIVE
RBC UA: NEGATIVE
SPEC GRAV UA: 1.015
Urobilinogen, UA: NEGATIVE

## 2015-08-06 MED ORDER — ONDANSETRON HCL 4 MG PO TABS: 4.0000 mg | ORAL_TABLET | Freq: Every day | ORAL | Status: DC | PRN

## 2015-08-06 NOTE — Progress Notes (Signed)
  Subjective:    Julia [name] is a 29 y.o. female being seen today for her obstetrical visit. She is at [redacted]w[redacted]d gestation. Patient reports: no complaints.  Problem List Items Addressed This Visit    None    Visit Diagnoses    Encounter for supervision of other normal pregnancy in second trimester    -  Primary    Relevant Orders    AFP, Quad Screen    POCT urinalysis dipstick (Completed)      Patient Active Problem List   Diagnosis Date Noted  . Pelvic pain 01/21/2014  . Galactorrhea 04/08/2013  . Dysmenorrhea 04/08/2013  . Vaginitis and vulvovaginitis, unspecified 04/08/2013  . Chronic cholecystitis 03/18/2013  . Unspecified constipation 01/27/2013  . Anemia 01/23/2013  . Abdominal pain 01/22/2013  . Nausea and vomiting 01/22/2013  . Pyelonephritis 01/22/2013    Objective:     BP 101/69 mmHg  Pulse 84  Temp(Src) 98.3 F (36.8 C)  Wt 136 lb (61.689 kg)  LMP 04/21/2015 Uterine Size: Below umbilicus   FHR: 135  Assessment:    Pregnancy @ [redacted]w[redacted]d  weeks Doing well    Plan:    Problem list reviewed and updated. Labs reviewed. Ultrasound report reviewed. Follow up in 4 weeks. FIRST/CF mutation testing/NIPT/QUAD SCREEN/fragile X/[name] population testing/Spinal muscular atrophy discussed: ordered. Role of ultrasound in pregnancy discussed; fetal survey: requested. Amniocentesis discussed: not indicated. 50% of 15 minute visit spent on counseling and coordination of care.

## 2015-08-07 LAB — AFP, QUAD SCREEN
AFP: 22 ng/mL
Age Alone: 1:623 {titer}
CURR GEST AGE: 14.4 wks.days
Down Syndrome Scr Risk Est: 1:3840 {titer}
HCG TOTAL: 32.6 [IU]/mL
INH: 174.2 pg/mL
Interpretation-AFP: NEGATIVE
MOM FOR HCG: 0.67
MOM FOR INH: 1.08
MoM for AFP: 0.85
Open Spina bifida: NEGATIVE
Osb Risk: 1:22400 {titer}
Tri 18 Scr Risk Est: NEGATIVE
UE3 MOM: 0.67
uE3 Value: 0.48 ng/mL

## 2015-08-13 ENCOUNTER — Ambulatory Visit (INDEPENDENT_AMBULATORY_CARE_PROVIDER_SITE_OTHER): Payer: Medicaid Other | Admitting: Certified Nurse Midwife

## 2015-08-13 VITALS — BP 106/66 | HR 91 | Temp 97.5°F | Wt 135.0 lb

## 2015-08-13 DIAGNOSIS — N760 Acute vaginitis: Secondary | ICD-10-CM

## 2015-08-13 DIAGNOSIS — Z3482 Encounter for supervision of other normal pregnancy, second trimester: Secondary | ICD-10-CM

## 2015-08-13 LAB — POCT URINALYSIS DIPSTICK
Bilirubin, UA: NEGATIVE
Blood, UA: NEGATIVE
Glucose, UA: NEGATIVE
Ketones, UA: NEGATIVE
LEUKOCYTES UA: NEGATIVE
Nitrite, UA: NEGATIVE
PROTEIN UA: NEGATIVE
Spec Grav, UA: 1.015
UROBILINOGEN UA: NEGATIVE
pH, UA: 7

## 2015-08-13 MED ORDER — FLUCONAZOLE 100 MG PO TABS: 100.0000 mg | ORAL_TABLET | Freq: Once | ORAL | Status: DC

## 2015-08-13 NOTE — Progress Notes (Signed)
  Subjective:    Julia [name] is a 29 y.o. female being seen today for her obstetrical visit. She is at [redacted]w[redacted]d gestation. Patient reports: no bleeding, no contractions, no cramping, no leaking, vaginal irritation and itching vaginally for several days.  Problem List Items Addressed This Visit    None    Visit Diagnoses    Encounter for supervision of other normal pregnancy in second trimester    -  Primary    Relevant Orders    POCT urinalysis dipstick    US OB Comp + 14 Wk    SureSwab, Vaginosis/Vaginitis Plus    Vaginitis        Relevant Medications    fluconazole (DIFLUCAN) 100 MG tablet    Other Relevant Orders    SureSwab, Vaginosis/Vaginitis Plus      Patient Active Problem List   Diagnosis Date Noted  . Pelvic pain 01/21/2014  . Galactorrhea 04/08/2013  . Dysmenorrhea 04/08/2013  . Vaginitis and vulvovaginitis, unspecified 04/08/2013  . Chronic cholecystitis 03/18/2013  . Unspecified constipation 01/27/2013  . Anemia 01/23/2013  . Abdominal pain 01/22/2013  . Nausea and vomiting 01/22/2013  . Pyelonephritis 01/22/2013    Objective:     BP 106/66 mmHg  Pulse 91  Temp(Src) 97.5 F (36.4 C)  Wt 135 lb (61.236 kg)  LMP 04/21/2015 Uterine Size: Below umbilicus   FHR: 150's  Assessment:    Pregnancy @ [redacted]w[redacted]d  weeks Doing well    Plan:    Problem list reviewed and updated. Labs reviewed.  Follow up in 4 weeks. FIRST/CF mutation testing/NIPT/QUAD SCREEN/fragile X/[name] population testing/Spinal muscular atrophy discussed: results reviewed. Role of ultrasound in pregnancy discussed; fetal survey: ordered. Amniocentesis discussed: not indicated. 50% of 15 minute visit spent on counseling and coordination of care.

## 2015-08-18 ENCOUNTER — Telehealth: Payer: Self-pay | Admitting: *Deleted

## 2015-08-18 DIAGNOSIS — B373 Candidiasis of vulva and vagina: Secondary | ICD-10-CM

## 2015-08-18 DIAGNOSIS — B3731 Acute candidiasis of vulva and vagina: Secondary | ICD-10-CM

## 2015-08-18 LAB — SURESWAB, VAGINOSIS/VAGINITIS PLUS
ATOPOBIUM VAGINAE: NOT DETECTED Log (cells/mL)
C. PARAPSILOSIS, DNA: NOT DETECTED
C. albicans, DNA: NOT DETECTED
C. glabrata, DNA: NOT DETECTED
C. trachomatis RNA, TMA: NOT DETECTED
C. tropicalis, DNA: NOT DETECTED
Gardnerella vaginalis: NOT DETECTED Log (cells/mL)
LACTOBACILLUS SPECIES: 6.9 Log (cells/mL)
MEGASPHAERA SPECIES: NOT DETECTED Log (cells/mL)
N. GONORRHOEAE RNA, TMA: NOT DETECTED
T. VAGINALIS RNA, QL TMA: NOT DETECTED

## 2015-08-18 MED ORDER — TERCONAZOLE 0.4 % VA CREA: 1.0000 | TOPICAL_CREAM | Freq: Every day | VAGINAL | Status: DC

## 2015-08-18 NOTE — Telephone Encounter (Signed)
Patient states she is having a lot of nausea and she just can't stomach any pills at thie time. She is requesting a vaginal treatment for her yeast.Ok to send vaginal treatment per [place].

## 2015-08-27 ENCOUNTER — Encounter: Payer: Self-pay | Admitting: Obstetrics

## 2015-09-03 ENCOUNTER — Encounter: Payer: Medicaid Other | Admitting: Certified Nurse Midwife

## 2015-09-10 ENCOUNTER — Ambulatory Visit (INDEPENDENT_AMBULATORY_CARE_PROVIDER_SITE_OTHER): Payer: Medicaid Other

## 2015-09-10 ENCOUNTER — Ambulatory Visit (INDEPENDENT_AMBULATORY_CARE_PROVIDER_SITE_OTHER): Payer: Medicaid Other | Admitting: Certified Nurse Midwife

## 2015-09-10 VITALS — BP 111/75 | HR 86 | Temp 98.3°F | Wt 139.0 lb

## 2015-09-10 DIAGNOSIS — Z36 Encounter for antenatal screening of mother: Secondary | ICD-10-CM

## 2015-09-10 DIAGNOSIS — Z3482 Encounter for supervision of other normal pregnancy, second trimester: Secondary | ICD-10-CM

## 2015-09-10 DIAGNOSIS — G44211 Episodic tension-type headache, intractable: Secondary | ICD-10-CM

## 2015-09-10 DIAGNOSIS — L719 Rosacea, unspecified: Secondary | ICD-10-CM

## 2015-09-10 DIAGNOSIS — O0942 Supervision of pregnancy with grand multiparity, second trimester: Secondary | ICD-10-CM

## 2015-09-10 LAB — POCT URINALYSIS DIPSTICK
Bilirubin, UA: NEGATIVE
GLUCOSE UA: NEGATIVE
Ketones, UA: NEGATIVE
NITRITE UA: NEGATIVE
PH UA: 6
Protein, UA: NEGATIVE
RBC UA: NEGATIVE
Spec Grav, UA: 1.01
UROBILINOGEN UA: NEGATIVE

## 2015-09-10 MED ORDER — BUTALBITAL-APAP-CAFFEINE 50-325-40 MG PO TABS: 1.0000 | ORAL_TABLET | Freq: Four times a day (QID) | ORAL | Status: DC | PRN

## 2015-09-10 MED ORDER — CLOTRIMAZOLE 1 % EX CREA: 1.0000 "application " | TOPICAL_CREAM | Freq: Two times a day (BID) | CUTANEOUS | Status: DC

## 2015-09-10 MED ORDER — BACITRACIN-NEOMYCIN-POLYMYXIN OINTMENT TUBE: 1.0000 "application " | TOPICAL_OINTMENT | Freq: Three times a day (TID) | CUTANEOUS | Status: DC

## 2015-09-10 MED ORDER — CITRANATAL HARMONY 30-1-260 MG PO CAPS: 1.0000 | ORAL_CAPSULE | Freq: Every day | ORAL | Status: DC

## 2015-09-10 NOTE — Progress Notes (Signed)
Subjective:    Julia [name] is a 29 y.o. female being seen today for her obstetrical visit. She is at [redacted]w[redacted]d gestation. Patient reports: headache, nausea, no bleeding, no contractions, no cramping, no leaking and vomiting.  Has tried tylenol without relief for HA.  Is having daily nausea with occasional vomiting.  States it is better during the day with the Zofran.  Given PNV to take at night.  Encouraged diclegis at night for nausea. Fetal movement: normal.  Problem List Items Addressed This Visit    None    Visit Diagnoses    Supervision of pregnancy with grand multiparity in second trimester    -  Primary    Relevant Medications    butalbital-acetaminophen-caffeine (FIORICET) 50-325-40 MG tablet    Prenat w/o A-FeCbn-DSS-FA-DHA (CITRANATAL HARMONY) 30-1-260 MG CAPS    Other Relevant Orders    POCT urinalysis dipstick (Completed)    Intractable episodic tension-type headache        Relevant Medications    butalbital-acetaminophen-caffeine (FIORICET) 50-325-40 MG tablet    Rosacea        Relevant Medications    neomycin-bacitracin-polymyxin (NEOSPORIN) OINT    clotrimazole (LOTRIMIN) 1 % cream      Patient Active Problem List   Diagnosis Date Noted  . Pelvic pain 01/21/2014  . Galactorrhea 04/08/2013  . Dysmenorrhea 04/08/2013  . Vaginitis and vulvovaginitis, unspecified 04/08/2013  . Chronic cholecystitis 03/18/2013  . Unspecified constipation 01/27/2013  . Anemia 01/23/2013  . Abdominal pain 01/22/2013  . Nausea and vomiting 01/22/2013  . Pyelonephritis 01/22/2013   Objective:    BP 111/75 mmHg  Pulse 86  Temp(Src) 98.3 F (36.8 C)  Wt 139 lb (63.05 kg)  LMP 04/21/2015 FHT: 152 BPM  Uterine Size: size equals dates     Assessment:    Pregnancy @ [redacted]w[redacted]d    Daily HA  Nausea daily  Rosacea  Plan:    OBGCT: discussed. Signs and symptoms of preterm labor: discussed.  Labs, problem list reviewed and updated 2 hr GTT planned Follow up in 4 weeks.

## 2015-09-10 NOTE — Progress Notes (Signed)
Patient states she is still feeling fatigue and having frequent headaches. Patient also is still having significant nausea in the am.

## 2015-10-08 ENCOUNTER — Ambulatory Visit (INDEPENDENT_AMBULATORY_CARE_PROVIDER_SITE_OTHER): Payer: Medicaid Other | Admitting: Certified Nurse Midwife

## 2015-10-08 VITALS — BP 104/70 | HR 95 | Wt 144.0 lb

## 2015-10-08 DIAGNOSIS — Z3482 Encounter for supervision of other normal pregnancy, second trimester: Secondary | ICD-10-CM

## 2015-10-08 DIAGNOSIS — R21 Rash and other nonspecific skin eruption: Secondary | ICD-10-CM

## 2015-10-08 LAB — POCT URINALYSIS DIPSTICK
BILIRUBIN UA: NEGATIVE
Blood, UA: NEGATIVE
GLUCOSE UA: NEGATIVE
KETONES UA: NEGATIVE
LEUKOCYTES UA: NEGATIVE
Nitrite, UA: NEGATIVE
PH UA: 5
Spec Grav, UA: 1.02
Urobilinogen, UA: NEGATIVE

## 2015-10-08 MED ORDER — VITAFOL GUMMIES 3.33-0.333-34.8 MG PO CHEW: 3.0000 | CHEWABLE_TABLET | Freq: Every day | ORAL | Status: DC

## 2015-10-08 NOTE — Progress Notes (Signed)
Subjective:    Julia [name] is a 29 y.o. female being seen today for her obstetrical visit. She is at 9650w4d gestation. Patient reports: no complaints . Fetal movement: normal.  States the prenatal vitamins make her sick to her stomach and she has vomited a few times and has tried various types of PNV, desires to try the gumies.  Also, states that she has had a rash around her nose, hx of rosacea and desires to see the dermatologist.  Also, states that she had a bump on her bottom that was itching, gone now.  Encouraged her not to shave and to trim the pubic hair.  Denies any recent changes in soaps, detergents, etc.    Problem List Items Addressed This Visit    None    Visit Diagnoses    Encounter for supervision of other normal pregnancy in second trimester    -  Primary    Relevant Orders    POCT urinalysis dipstick (Completed)    Ambulatory referral to Dermatology    Rash of face        Relevant Orders    Ambulatory referral to Dermatology      Patient Active Problem List   Diagnosis Date Noted  . Pelvic pain 01/21/2014  . Galactorrhea 04/08/2013  . Dysmenorrhea 04/08/2013  . Vaginitis and vulvovaginitis, unspecified 04/08/2013  . Chronic cholecystitis 03/18/2013  . Unspecified constipation 01/27/2013  . Anemia 01/23/2013  . Abdominal pain 01/22/2013  . Nausea and vomiting 01/22/2013  . Pyelonephritis 01/22/2013   Objective:    BP 104/70 mmHg  Pulse 95  Wt 144 lb (65.318 kg)  LMP 04/21/2015 FHT: 150 BPM  Uterine Size: size equals dates   Vulva: no lesions, normal appearing tissue.   Assessment:    Pregnancy @ 8650w4d    Rash of face around nose  Doing well  Plan:    OBGCT: ordered for next visit. Signs and symptoms of preterm labor: discussed.  Labs, problem list reviewed and updated 2 hr GTT planned Follow up in 4 weeks.

## 2015-10-13 LAB — SURESWAB BACTERIAL VAGINOSIS/ITIS
ATOPOBIUM VAGINAE: NOT DETECTED Log (cells/mL)
C. PARAPSILOSIS, DNA: NOT DETECTED
C. TROPICALIS, DNA: NOT DETECTED
C. albicans, DNA: NOT DETECTED
C. glabrata, DNA: NOT DETECTED
Gardnerella vaginalis: NOT DETECTED Log (cells/mL)
LACTOBACILLUS SPECIES: 7.2 Log (cells/mL)
MEGASPHAERA SPECIES: NOT DETECTED Log (cells/mL)
T. VAGINALIS RNA, QL TMA: NOT DETECTED

## 2015-11-04 ENCOUNTER — Ambulatory Visit (INDEPENDENT_AMBULATORY_CARE_PROVIDER_SITE_OTHER): Payer: Medicaid Other | Admitting: Certified Nurse Midwife

## 2015-11-04 ENCOUNTER — Other Ambulatory Visit: Payer: Medicaid Other

## 2015-11-04 VITALS — BP 108/67 | HR 86 | Temp 97.9°F | Wt 148.0 lb

## 2015-11-04 DIAGNOSIS — Z3482 Encounter for supervision of other normal pregnancy, second trimester: Secondary | ICD-10-CM

## 2015-11-04 LAB — CBC
HEMATOCRIT: 31.6 % — AB (ref 36.0–46.0)
HEMOGLOBIN: 10.4 g/dL — AB (ref 12.0–15.0)
MCH: 26.5 pg (ref 26.0–34.0)
MCHC: 32.9 g/dL (ref 30.0–36.0)
MCV: 80.6 fL (ref 78.0–100.0)
MPV: 9.2 fL (ref 8.6–12.4)
Platelets: 286 10*3/uL (ref 150–400)
RBC: 3.92 MIL/uL (ref 3.87–5.11)
RDW: 15.5 % (ref 11.5–15.5)
WBC: 9.7 10*3/uL (ref 4.0–10.5)

## 2015-11-04 LAB — POCT URINALYSIS DIPSTICK
Bilirubin, UA: NEGATIVE
GLUCOSE UA: NEGATIVE
Ketones, UA: NEGATIVE
Leukocytes, UA: NEGATIVE
NITRITE UA: NEGATIVE
PROTEIN UA: NEGATIVE
RBC UA: NEGATIVE
Spec Grav, UA: 1.015
Urobilinogen, UA: NEGATIVE
pH, UA: 6

## 2015-11-04 NOTE — Progress Notes (Signed)
Subjective:    Julia [name] is a 29 y.o. female being seen today for her obstetrical visit. She is at 1836w3d gestation. Patient reports: no complaints . Fetal movement: normal.  Problem List Items Addressed This Visit    None    Visit Diagnoses    Encounter for supervision of other normal pregnancy in second trimester    -  Primary    Relevant Orders    POCT urinalysis dipstick (Completed)    Glucose Tolerance, 2 Hours w/1 Hour    CBC    HIV antibody    RPR      Patient Active Problem List   Diagnosis Date Noted  . Pelvic pain 01/21/2014  . Galactorrhea 04/08/2013  . Dysmenorrhea 04/08/2013  . Vaginitis and vulvovaginitis, unspecified 04/08/2013  . Chronic cholecystitis 03/18/2013  . Unspecified constipation 01/27/2013  . Anemia 01/23/2013  . Abdominal pain 01/22/2013  . Nausea and vomiting 01/22/2013  . Pyelonephritis 01/22/2013   Objective:    BP 108/67 mmHg  Pulse 86  Temp(Src) 97.9 F (36.6 C)  Wt 148 lb (67.132 kg)  LMP 04/21/2015 FHT: 160 BPM  Uterine Size: size equals dates     Assessment:    Pregnancy @ 1536w3d    Doing well  Plan:    OBGCT: ordered. Signs and symptoms of preterm labor: discussed.  Labs, problem list reviewed and updated 2 hr GTT today Follow up in 2 weeks.

## 2015-11-05 LAB — GLUCOSE TOLERANCE, 2 HOURS W/ 1HR
GLUCOSE, FASTING: 77 mg/dL (ref 65–99)
Glucose, 1 hour: 167 mg/dL (ref 70–170)
Glucose, 2 hour: 150 mg/dL — ABNORMAL HIGH (ref 70–139)

## 2015-11-05 LAB — RPR

## 2015-11-05 LAB — HIV ANTIBODY (ROUTINE TESTING W REFLEX): HIV 1&2 Ab, 4th Generation: NONREACTIVE

## 2015-11-10 ENCOUNTER — Other Ambulatory Visit: Payer: Self-pay | Admitting: Certified Nurse Midwife

## 2015-11-18 ENCOUNTER — Ambulatory Visit (INDEPENDENT_AMBULATORY_CARE_PROVIDER_SITE_OTHER): Payer: Medicaid Other | Admitting: Certified Nurse Midwife

## 2015-11-18 VITALS — BP 112/70 | HR 91 | Temp 98.5°F | Wt 150.0 lb

## 2015-11-18 DIAGNOSIS — Z3493 Encounter for supervision of normal pregnancy, unspecified, third trimester: Secondary | ICD-10-CM

## 2015-11-18 LAB — POCT URINALYSIS DIPSTICK
BILIRUBIN UA: NEGATIVE
Glucose, UA: 50
Ketones, UA: NEGATIVE
Leukocytes, UA: NEGATIVE
NITRITE UA: NEGATIVE
PH UA: 5
RBC UA: NEGATIVE
Spec Grav, UA: 1.02
UROBILINOGEN UA: NEGATIVE

## 2015-11-18 NOTE — Progress Notes (Signed)
Numbness in hands and legs- daily.Soles of feet hurt.  Patient gets headaches daily- especially the last 4 days. Tylenol does help the headache.

## 2015-11-19 NOTE — Progress Notes (Signed)
Subjective:    Julia [name] is a 29 y.o. female being seen today for her obstetrical visit. She is at 5224w4d gestation. Patient reports no complaints. Fetal movement: normal.  Problem List Items Addressed This Visit    None    Visit Diagnoses    Prenatal care, third trimester    -  Primary    Relevant Orders    POCT urinalysis dipstick (Completed)    US OB Limited      Patient Active Problem List   Diagnosis Date Noted  . Pelvic pain 01/21/2014  . Galactorrhea 04/08/2013  . Dysmenorrhea 04/08/2013  . Vaginitis and vulvovaginitis, unspecified 04/08/2013  . Chronic cholecystitis 03/18/2013  . Unspecified constipation 01/27/2013  . Anemia 01/23/2013  . Abdominal pain 01/22/2013  . Nausea and vomiting 01/22/2013  . Pyelonephritis 01/22/2013   Objective:    BP 112/70 mmHg  Pulse 91  Temp(Src) 98.5 F (36.9 C)  Wt 150 lb (68.04 kg)  LMP 04/21/2015 FHT:  145 BPM  Uterine Size: size equals dates  Presentation: cephalic     Assessment:    Pregnancy @ 3324w4d weeks   Hx of placenta close to [facility]  Plan:     labs reviewed, problem list updated Consent signed. GBS planning TDAP offered  Rhogam given for RH negative Pediatrician: discussed. Infant feeding: plans to breastfeed. Maternity leave: discussed. Cigarette smoking: never smoked. Orders Placed This Encounter  Procedures  . US OB Limited    Standing Status: Future     Number of Occurrences:      Standing Expiration Date: 01/19/2017    Order Specific Question:  Reason for Exam (SYMPTOM  OR DIAGNOSIS REQUIRED)    Answer:  placental placement, hx of placenta close to os    Order Specific Question:  Preferred imaging location?    Answer:  Internal  . POCT urinalysis dipstick   No orders of the defined types were placed in this encounter.   Follow up in 2 Weeks.

## 2015-11-23 ENCOUNTER — Inpatient Hospital Stay (HOSPITAL_COMMUNITY)
Admission: AD | Admit: 2015-11-23 | Discharge: 2015-11-23 | Disposition: A | Discharge status: Home / Self Care | Payer: Medicaid Other | Source: Ambulatory Visit | Attending: Obstetrics | Admitting: Obstetrics

## 2015-11-23 ENCOUNTER — Encounter (HOSPITAL_COMMUNITY): Payer: Self-pay | Admitting: *Deleted

## 2015-11-23 DIAGNOSIS — Z87442 Personal history of urinary calculi: Secondary | ICD-10-CM | POA: Insufficient documentation

## 2015-11-23 DIAGNOSIS — Z3A30 30 weeks gestation of pregnancy: Secondary | ICD-10-CM | POA: Diagnosis not present

## 2015-11-23 DIAGNOSIS — R109 Unspecified abdominal pain: Secondary | ICD-10-CM

## 2015-11-23 DIAGNOSIS — O26893 Other specified pregnancy related conditions, third trimester: Secondary | ICD-10-CM | POA: Insufficient documentation

## 2015-11-23 DIAGNOSIS — O99891 Other specified diseases and conditions complicating pregnancy: Secondary | ICD-10-CM

## 2015-11-23 DIAGNOSIS — O26899 Other specified pregnancy related conditions, unspecified trimester: Secondary | ICD-10-CM

## 2015-11-23 DIAGNOSIS — O9989 Other specified diseases and conditions complicating pregnancy, childbirth and the puerperium: Secondary | ICD-10-CM | POA: Diagnosis not present

## 2015-11-23 DIAGNOSIS — M545 Low back pain: Secondary | ICD-10-CM | POA: Insufficient documentation

## 2015-11-23 DIAGNOSIS — M549 Dorsalgia, unspecified: Secondary | ICD-10-CM

## 2015-11-23 LAB — URINE MICROSCOPIC-ADD ON

## 2015-11-23 LAB — URINALYSIS, ROUTINE W REFLEX MICROSCOPIC
Bilirubin Urine: NEGATIVE
Glucose, UA: 500 mg/dL — AB
Ketones, ur: 15 mg/dL — AB
Leukocytes, UA: NEGATIVE
NITRITE: NEGATIVE
PROTEIN: NEGATIVE mg/dL
SPECIFIC GRAVITY, URINE: 1.025 (ref 1.005–1.030)
pH: 6 (ref 5.0–8.0)

## 2015-11-23 NOTE — MAU Note (Signed)
Pt presents with complaint of lower back pain, cramping and pressure since about 2am today. Denies bleeding.

## 2015-11-23 NOTE — Discharge Instructions (Signed)

## 2015-11-23 NOTE — MAU Provider Note (Signed)
History   G4P2012 at 30.1 wks in with c/o cramping and low back pain that has been going on for several days.  CSN: [id number]  Arrival date & time 11/23/15  2000   First Provider Initiated Contact with Patient 11/23/15 2022      No chief complaint on file.   HPI  Past Medical History  Diagnosis Date  . Chlamydia 2007  . Infection   . Anxiety   . Kidney stones   . GERD (gastroesophageal reflux disease)   . Back pain   . Arthritis   . Depression   . Migraine   . Dyspepsia   . Sinusitis   . Right sided weakness   . Complication of anesthesia   . PONV (postoperative nausea and vomiting)     Past Surgical History  Procedure Laterality Date  . Dilation and curettage of uterus    . Colonoscopy  2012  . Upper gastrointestinal endoscopy  2012  . Cholecystectomy N/A 04/15/2013    Procedure: LAPAROSCOPIC CHOLECYSTECTOMY;  Surgeon: Shelly Rubenstein, MD;  Location: [facility] OR;  Service: General;  Laterality: N/A;  . Esophagogastroduodenoscopy (egd) with propofol N/A 04/02/2014    Procedure: ESOPHAGOGASTRODUODENOSCOPY (EGD) WITH PROPOFOL;  Surgeon: Willis Modena, MD;  Location: [facility];  Service: Endoscopy;  Laterality: N/A;  . Eus N/A 04/02/2014    Procedure: ESOPHAGEAL ENDOSCOPIC ULTRASOUND (EUS) RADIAL;  Surgeon: Willis Modena, MD;  Location: [facility];  Service: Endoscopy;  Laterality: N/A;  . Esophageal manometry N/A 03/02/2015    Procedure: ESOPHAGEAL MANOMETRY (EM);  Surgeon: Willis Modena, MD;  Location: [facility];  Service: Endoscopy;  Laterality: N/A;    Family History  Problem Relation Age of Onset  . Anesthesia problems Neg Hx   . Diabetes type II Mother   . Hypertension Mother   . Diabetes Mother   . Hyperlipidemia Father   . Hypertension Father     Social History  Substance Use Topics  . Smoking status: Never Smoker   . Smokeless tobacco: Never Used  . Alcohol Use: No    OB History    Gravida Para Term Preterm AB TAB SAB Ectopic Multiple Living    0 1 0 1 0 0 2      Review of Systems  Constitutional: Negative.   HENT: Negative.   Eyes: Negative.   Respiratory: Positive for apnea.   Cardiovascular: Negative.   Gastrointestinal: Positive for abdominal pain.  Endocrine: Negative.   Genitourinary: Negative.   Musculoskeletal: Positive for back pain.  Skin: Negative.   Allergic/Immunologic: Negative.   Neurological: Negative.   Hematological: Negative.   Psychiatric/Behavioral: Negative.     Allergies  Review of patient's allergies indicates no known allergies.  Home Medications  No current outpatient prescriptions on file.  BP 119/69 mmHg  Pulse 86  Temp(Src) 98.1 F (36.7 C) (Oral)  Resp 18  Ht 5' (1.524 m)  Wt 151 lb (68.493 kg)  BMI 29.49 kg/m2  SpO2 99%  LMP 04/21/2015  Physical Exam  Constitutional: She is oriented to person, place, and time. She appears well-developed and well-nourished.  HENT:  Head: Normocephalic.  Eyes: Pupils are equal, round, and reactive to light.  Neck: Normal range of motion.  Cardiovascular: Normal rate, regular rhythm, normal heart sounds and intact distal pulses.   Pulmonary/Chest: Effort normal and breath sounds normal.  Abdominal: Soft. Bowel sounds are normal.  Genitourinary: Vagina normal and uterus normal.  Musculoskeletal: Normal range of motion.  Neurological: She is alert and  oriented to person, place, and time. She has normal reflexes.  Skin: Skin is warm and dry.  Psychiatric: She has a normal mood and affect. Her behavior is normal. Judgment and thought content normal.    MAU Course  Procedures (including critical care time)  Labs Reviewed  URINALYSIS, ROUTINE W REFLEX MICROSCOPIC (NOT AT [facility])   No results found.   No diagnosis found.    MDM  SVE firm/cl/post/blts. Will d/c home with reassuring FHR pattern.

## 2015-11-25 ENCOUNTER — Other Ambulatory Visit: Payer: Self-pay | Admitting: Certified Nurse Midwife

## 2015-11-25 ENCOUNTER — Ambulatory Visit (INDEPENDENT_AMBULATORY_CARE_PROVIDER_SITE_OTHER): Payer: Medicaid Other

## 2015-11-25 DIAGNOSIS — O321XX1 Maternal care for breech presentation, fetus 1: Secondary | ICD-10-CM

## 2015-11-25 DIAGNOSIS — Z3493 Encounter for supervision of normal pregnancy, unspecified, third trimester: Secondary | ICD-10-CM

## 2015-12-02 ENCOUNTER — Ambulatory Visit (INDEPENDENT_AMBULATORY_CARE_PROVIDER_SITE_OTHER): Payer: Medicaid Other | Admitting: Certified Nurse Midwife

## 2015-12-02 VITALS — BP 111/75 | HR 91 | Temp 98.5°F | Wt 151.0 lb

## 2015-12-02 DIAGNOSIS — R519 Headache, unspecified: Secondary | ICD-10-CM

## 2015-12-02 DIAGNOSIS — R51 Headache: Secondary | ICD-10-CM

## 2015-12-02 DIAGNOSIS — O26893 Other specified pregnancy related conditions, third trimester: Secondary | ICD-10-CM

## 2015-12-02 DIAGNOSIS — O99613 Diseases of the digestive system complicating pregnancy, third trimester: Secondary | ICD-10-CM

## 2015-12-02 DIAGNOSIS — Z3483 Encounter for supervision of other normal pregnancy, third trimester: Secondary | ICD-10-CM

## 2015-12-02 DIAGNOSIS — G4700 Insomnia, unspecified: Secondary | ICD-10-CM

## 2015-12-02 DIAGNOSIS — K5900 Constipation, unspecified: Secondary | ICD-10-CM

## 2015-12-02 LAB — POCT URINALYSIS DIPSTICK
BILIRUBIN UA: NEGATIVE
GLUCOSE UA: NEGATIVE
Ketones, UA: NEGATIVE
Leukocytes, UA: NEGATIVE
NITRITE UA: NEGATIVE
Protein, UA: NEGATIVE
Spec Grav, UA: 1.015
UROBILINOGEN UA: NEGATIVE
pH, UA: 6

## 2015-12-02 MED ORDER — POLYETHYLENE GLYCOL 3350 17 GM/SCOOP PO POWD: 17.0000 g | Freq: Every day | ORAL | Status: DC

## 2015-12-02 MED ORDER — DOCUSATE SODIUM 100 MG PO CAPS
100.0000 mg | ORAL_CAPSULE | Freq: Two times a day (BID) | ORAL | Status: DC
Start: 2015-12-02 — End: 2016-01-14

## 2015-12-02 MED ORDER — BUTALBITAL-APAP-CAFFEINE 50-325-40 MG PO TABS: 1.0000 | ORAL_TABLET | Freq: Four times a day (QID) | ORAL | Status: DC | PRN

## 2015-12-02 MED ORDER — DIPHENHYDRAMINE HCL 25 MG PO TABS
50.0000 mg | ORAL_TABLET | Freq: Every day | ORAL | Status: DC
Start: 2015-12-02 — End: 2016-01-14

## 2015-12-02 NOTE — Progress Notes (Signed)
Headache and dizziness x 1 week. Pt states that she does not have much of an appetite.

## 2015-12-02 NOTE — Progress Notes (Signed)
Subjective:    Julia [name] is a 29 y.o. female being seen today for her obstetrical visit. She is at 6467w3d gestation. Patient reports fatigue, headache, no bleeding, no contractions, no cramping, no leaking and constipation for a few weeks, has tried fleets enema with little reliefe.  States daily HA and has aura with dizziness for about a week.  Has had decreased appetite for about a week d/t HA.  States that she is only getting a few hours of sleep, sleep hygiene practices discussed. Fetal movement: normal.  Problem List Items Addressed This Visit    None    Visit Diagnoses    Encounter for supervision of other normal pregnancy in third trimester    -  Primary    Relevant Orders    POCT urinalysis dipstick (Completed)    Headache in pregnancy, antepartum, third trimester        Relevant Medications    butalbital-acetaminophen-caffeine (FIORICET) 50-325-40 MG tablet    Constipation in pregnancy, third trimester        Relevant Medications    polyethylene glycol powder (GLYCOLAX/MIRALAX) powder    docusate sodium (COLACE) 100 MG capsule    Insomnia        Relevant Medications    diphenhydrAMINE (BENADRYL) 25 MG tablet      Patient Active Problem List   Diagnosis Date Noted  . Pelvic pain 01/21/2014  . Galactorrhea 04/08/2013  . Dysmenorrhea 04/08/2013  . Vaginitis and vulvovaginitis, unspecified 04/08/2013  . Chronic cholecystitis 03/18/2013  . Unspecified constipation 01/27/2013  . Anemia 01/23/2013  . Abdominal pain 01/22/2013  . Nausea and vomiting 01/22/2013  . Pyelonephritis 01/22/2013   Objective:    BP 111/75 mmHg  Pulse 91  Temp(Src) 98.5 F (36.9 C)  Wt 151 lb (68.493 kg)  LMP 04/21/2015 FHT:  140 BPM  Uterine Size: size equals dates  Presentation: breech     Assessment:    Pregnancy @ 9367w3d weeks   HA  Constipation  Insomnia  Plan:     labs reviewed, problem list updated Consent signed. GBS sent TDAP offered  Rhogam given for RH  negative Pediatrician: discussed. Infant feeding: plans to breastfeed. Maternity leave: discussed. Cigarette smoking: never smoked. Orders Placed This Encounter  Procedures  . POCT urinalysis dipstick   Meds ordered this encounter  Medications  . diphenhydrAMINE (BENADRYL) 25 MG tablet    Sig: Take 2 tablets (50 mg total) by mouth at bedtime.    Dispense:  90 tablet    Refill:  4  . polyethylene glycol powder (GLYCOLAX/MIRALAX) powder    Sig: Take 17 g by mouth daily.    Dispense:  500 g    Refill:  0  . docusate sodium (COLACE) 100 MG capsule    Sig: Take 1 capsule (100 mg total) by mouth 2 (two) times daily.    Dispense:  90 capsule    Refill:  1  . butalbital-acetaminophen-caffeine (FIORICET) 50-325-40 MG tablet    Sig: Take 1-2 tablets by mouth every 6 (six) hours as needed for headache.    Dispense:  45 tablet    Refill:  4   Follow up in 2 Weeks.

## 2015-12-10 ENCOUNTER — Ambulatory Visit: Payer: Self-pay | Admitting: Certified Nurse Midwife

## 2015-12-11 ENCOUNTER — Ambulatory Visit (INDEPENDENT_AMBULATORY_CARE_PROVIDER_SITE_OTHER): Payer: Medicaid Other | Admitting: Certified Nurse Midwife

## 2015-12-11 VITALS — BP 117/75 | HR 104 | Temp 98.3°F | Wt 155.0 lb

## 2015-12-11 DIAGNOSIS — N760 Acute vaginitis: Secondary | ICD-10-CM

## 2015-12-11 LAB — POCT URINALYSIS DIPSTICK
BILIRUBIN UA: NEGATIVE
Blood, UA: NEGATIVE
GLUCOSE UA: NEGATIVE
Ketones, UA: NEGATIVE
Leukocytes, UA: NEGATIVE
Nitrite, UA: NEGATIVE
Protein, UA: NEGATIVE
SPEC GRAV UA: 1.015
UROBILINOGEN UA: NEGATIVE
pH, UA: 6

## 2015-12-11 MED ORDER — FLUCONAZOLE 100 MG PO TABS
100.0000 mg | ORAL_TABLET | Freq: Once | ORAL | Status: DC
Start: 2015-12-11 — End: 2015-12-22

## 2015-12-11 MED ORDER — TERCONAZOLE 0.4 % VA CREA: 1.0000 | TOPICAL_CREAM | Freq: Every day | VAGINAL | Status: DC

## 2015-12-11 NOTE — Progress Notes (Signed)
Subjective:    Julia [name] is a 29 y.o. female being seen today for her obstetrical visit. She is at 2328w5d gestation. Patient reports no bleeding, no contractions, no cramping, no leaking, vaginal irritation and low back pain. States has had lumbar back pain for a few days.  Denies any dysuria.  Has a hx of pyelonephritis in previous pregnancy.  Fetal movement: normal.  Problem List Items Addressed This Visit    None    Visit Diagnoses    Vaginitis    -  Primary    Relevant Medications    fluconazole (DIFLUCAN) 100 MG tablet    terconazole (TERAZOL 7) 0.4 % vaginal cream      Patient Active Problem List   Diagnosis Date Noted  . Pelvic pain 01/21/2014  . Galactorrhea 04/08/2013  . Dysmenorrhea 04/08/2013  . Vaginitis and vulvovaginitis, unspecified 04/08/2013  . Chronic cholecystitis 03/18/2013  . Unspecified constipation 01/27/2013  . Anemia 01/23/2013  . Abdominal pain 01/22/2013  . Nausea and vomiting 01/22/2013  . Pyelonephritis 01/22/2013   Objective:    BP 117/75 mmHg  Pulse 104  Temp(Src) 98.3 F (36.8 C)  Wt 155 lb (70.308 kg)  LMP 04/21/2015 FHT:  150 BPM  Uterine Size: size equals dates  Presentation: breech   Cervix: 1cm, long, posterior  Negative Nitrazine paper.    Assessment:    Pregnancy @ 8828w5d weeks   Vaginitis  Plan:     labs reviewed, problem list updated Consent signed. GBS planning TDAP offered  Rhogam given for RH negative Pediatrician: discussed. Infant feeding: plans to breastfeed. Maternity leave: discussed. Cigarette smoking: never smoked. No orders of the defined types were placed in this encounter.   Meds ordered this encounter  Medications  . fluconazole (DIFLUCAN) 100 MG tablet    Sig: Take 1 tablet (100 mg total) by mouth once. Repeat dose in 48-72 hour.    Dispense:  3 tablet    Refill:  0  . terconazole (TERAZOL 7) 0.4 % vaginal cream    Sig: Place 1 applicator vaginally at bedtime.    Dispense:  45 g   Refill:  0   Follow up in 2 Weeks or as scheduled.

## 2015-12-12 LAB — CULTURE, OB URINE
Colony Count: NO GROWTH
Organism ID, Bacteria: NO GROWTH

## 2015-12-13 NOTE — L&D Delivery Note (Signed)
Operative Delivery Note At 8:51 AM a viable female was delivered via Vaginal, Spontaneous Delivery.  Presentation: vertex; Position: Occiput,; Station: +3.  Delivery of the head: 01/29/2016  8:58 AM First maneuver: 01/29/2016  8:58 AM, McRoberts Suprapubic Pressure Second maneuver: attempted posterior arm and rubins,   Third maneuver: ,   Fourth maneuver: ,   Fifth maneuver: ,   Sixth maneuver: ,    Verbal consent: obtained from patient.  APGAR: 2, 7; weight  .   Placenta status: Intact, Spontaneous Pathology.   Cord: 3 vessels with the following complications: None.  Cord pH: N/A  Anesthesia: Epidural  Episiotomy: None Lacerations: None Suture Repair: none Est. Blood Loss (mL): 200    Delivery Note Delivery Note  This is a 30 year old G 4 P3 who was admitted for Not in labor.. She progressed with Cytotec, Pitocin,  Epidural, Amnioinfusion with IUPC to the second stage of labor.  She pushed for 10 min.  At 8:51 AM she delivered a viable infant female, cephalic, over an intact perineum.  A nuchal cord   was not identified.  Cord double clamped and cut. Infant taken to warmer for lack of respiratory effort/color. Code Apgar called.  NICU team arrived at warmer, infant stabilized.  NICU left after stabilization. Apgar scores were 2 and 7. The placenta delivered spontaneously, shultz, with a 3 vessel cord.  Inspection revealed none. The uterus was firm bleeding stable.  EBL was 200.    Placenta was sent.  There were no complications during the procedure.  Mom and baby skin to skin following delivery. Left in stable condition.   Vaginal, Spontaneous Delivery (Presentation: ; Occiput Anterior).  APGAR: 2, 7; weight  .   Placenta status: Intact, Spontaneous Pathology.  Cord: 3 vessels with the following complications: None.  Cord pH: N/A  Anesthesia: Epidural  Episiotomy: None Lacerations: None Suture Repair: none Est. Blood Loss (mL): 200  Mom to postpartum.  Baby to Couplet care /  Skin to Skin.  Roe Coombs, CNM 01/29/2016, 9:29 AM

## 2015-12-16 ENCOUNTER — Encounter: Payer: Medicaid Other | Admitting: Certified Nurse Midwife

## 2015-12-22 ENCOUNTER — Other Ambulatory Visit: Payer: Self-pay | Admitting: Certified Nurse Midwife

## 2015-12-22 DIAGNOSIS — B9689 Other specified bacterial agents as the cause of diseases classified elsewhere: Secondary | ICD-10-CM

## 2015-12-22 DIAGNOSIS — N760 Acute vaginitis: Secondary | ICD-10-CM

## 2015-12-22 LAB — SURESWAB, VAGINOSIS/VAGINITIS PLUS
Atopobium vaginae: NOT DETECTED Log (cells/mL)
BV CATEGORY: UNDETERMINED — AB
C. GLABRATA, DNA: NOT DETECTED
C. TROPICALIS, DNA: NOT DETECTED
C. albicans, DNA: NOT DETECTED
C. parapsilosis, DNA: DETECTED — AB
C. trachomatis RNA, TMA: NOT DETECTED
GARDNERELLA VAGINALIS: NOT DETECTED Log (cells/mL)
LACTOBACILLUS SPECIES: 6.9 Log (cells/mL)
MEGASPHAERA SPECIES: 8 Log (cells/mL)
N. GONORRHOEAE RNA, TMA: NOT DETECTED
T. vaginalis RNA, QL TMA: NOT DETECTED

## 2015-12-22 MED ORDER — METRONIDAZOLE 500 MG PO TABS: 500.0000 mg | ORAL_TABLET | Freq: Two times a day (BID) | ORAL | Status: DC

## 2015-12-22 MED ORDER — FLUCONAZOLE 100 MG PO TABS: 100.0000 mg | ORAL_TABLET | Freq: Once | ORAL | Status: DC

## 2015-12-22 MED ORDER — TERCONAZOLE 0.4 % VA CREA: 1.0000 | TOPICAL_CREAM | Freq: Every day | VAGINAL | Status: DC

## 2015-12-25 ENCOUNTER — Ambulatory Visit (INDEPENDENT_AMBULATORY_CARE_PROVIDER_SITE_OTHER): Payer: Medicaid Other | Admitting: Certified Nurse Midwife

## 2015-12-25 VITALS — BP 110/74 | HR 101 | Wt 157.0 lb

## 2015-12-25 DIAGNOSIS — Z3483 Encounter for supervision of other normal pregnancy, third trimester: Secondary | ICD-10-CM

## 2015-12-25 LAB — POCT URINALYSIS DIPSTICK
Bilirubin, UA: NEGATIVE
Glucose, UA: 50
KETONES UA: NEGATIVE
Nitrite, UA: NEGATIVE
PROTEIN UA: NEGATIVE
RBC UA: NEGATIVE
SPEC GRAV UA: 1.01
UROBILINOGEN UA: NEGATIVE
pH, UA: 7

## 2015-12-25 MED ORDER — CLINDAMYCIN PHOSPHATE 1 % EX GEL: CUTANEOUS | Status: DC

## 2015-12-26 NOTE — Progress Notes (Signed)
Subjective:    Julia [name] is a 30 y.o. female being seen today for her obstetrical visit. She is at 8354w6d gestation. Patient reports no bleeding, no contractions, no cramping, no leaking and vaginal irritation. Fetal movement: normal.  Problem List Items Addressed This Visit    None    Visit Diagnoses    Encounter for supervision of other normal pregnancy in third trimester    -  Primary    Relevant Orders    POCT urinalysis dipstick (Completed)      Patient Active Problem List   Diagnosis Date Noted  . Pelvic pain 01/21/2014  . Galactorrhea 04/08/2013  . Dysmenorrhea 04/08/2013  . Vaginitis and vulvovaginitis, unspecified 04/08/2013  . Chronic cholecystitis 03/18/2013  . Unspecified constipation 01/27/2013  . Anemia 01/23/2013  . Abdominal pain 01/22/2013  . Nausea and vomiting 01/22/2013  . Pyelonephritis 01/22/2013   Objective:    BP 110/74 mmHg  Pulse 101  Wt 157 lb (71.215 kg)  LMP 04/21/2015 FHT:  145 BPM  Uterine Size: size equals dates  Presentation: cephalic     Assessment:    Pregnancy @ 8354w6d weeks   Plan:     labs reviewed, problem list updated Consent signed. GBS planning TDAP offered  Rhogam given for RH negative Pediatrician: discussed. Infant feeding: plans to breastfeed. Maternity leave: N/A. Cigarette smoking: never smoked. Orders Placed This Encounter  Procedures  . POCT urinalysis dipstick   Meds ordered this encounter  Medications  . clindamycin (CLINDAGEL) 1 % gel    Sig: Apply to affected area 2 times daily    Dispense:  30 g    Refill:  0   Follow up in 1 Week.

## 2016-01-01 ENCOUNTER — Ambulatory Visit (INDEPENDENT_AMBULATORY_CARE_PROVIDER_SITE_OTHER): Payer: Medicaid Other | Admitting: Certified Nurse Midwife

## 2016-01-01 VITALS — BP 110/74 | HR 89 | Temp 98.8°F | Wt 159.0 lb

## 2016-01-01 DIAGNOSIS — Z3483 Encounter for supervision of other normal pregnancy, third trimester: Secondary | ICD-10-CM

## 2016-01-01 LAB — POCT URINALYSIS DIPSTICK
BILIRUBIN UA: NEGATIVE
GLUCOSE UA: NEGATIVE
Ketones, UA: NEGATIVE
Leukocytes, UA: NEGATIVE
Nitrite, UA: NEGATIVE
Protein, UA: NEGATIVE
RBC UA: NEGATIVE
SPEC GRAV UA: 1.015
UROBILINOGEN UA: NEGATIVE
pH, UA: 7

## 2016-01-02 LAB — STREP B DNA PROBE: GBSP: NOT DETECTED

## 2016-01-02 NOTE — Progress Notes (Signed)
Subjective:    Julia [name] is a 30 y.o. female being seen today for her obstetrical visit. She is at [redacted]w[redacted]d gestation. Patient reports backache, no bleeding, no contractions, no cramping and no leaking. Fetal movement: normal.  Problem List Items Addressed This Visit    None    Visit Diagnoses    Encounter for supervision of other normal pregnancy in third trimester    -  Primary    Relevant Orders    Strep B DNA probe    POCT urinalysis dipstick (Completed)    US OB Limited      Patient Active Problem List   Diagnosis Date Noted  . Pelvic pain 01/21/2014  . Galactorrhea 04/08/2013  . Dysmenorrhea 04/08/2013  . Vaginitis and vulvovaginitis, unspecified 04/08/2013  . Chronic cholecystitis 03/18/2013  . Unspecified constipation 01/27/2013  . Anemia 01/23/2013  . Abdominal pain 01/22/2013  . Nausea and vomiting 01/22/2013  . Pyelonephritis 01/22/2013   Objective:    BP 110/74 mmHg  Pulse 89  Temp(Src) 98.8 F (37.1 C)  Wt 159 lb (72.122 kg)  LMP 04/21/2015 FHT:  145 BPM  Uterine Size: 37 cm and size greater than dates  Presentation: unsure   Cervix: 1 cm dilated, soft, posterior, long & thick.  Presenting part not engaged in pelvis  Assessment:    Pregnancy @ [redacted]w[redacted]d weeks   Pregnancy discomforts  Hx of breech presentation this pregnancy, f/u ultrasound ordered ? Cephalic presentation  Plan:     labs reviewed, problem list updated Consent signed. GBS sent TDAP offered  Rhogam given for RH negative Pediatrician: discussed. Infant feeding: plans to breastfeed. Maternity leave: N/A. Cigarette smoking: never smoked. Orders Placed This Encounter  Procedures  . Strep B DNA probe  . US OB Limited    Standing Status: Future     Number of Occurrences:      Standing Expiration Date: 02/28/2017    Order Specific Question:  Reason for Exam (SYMPTOM  OR DIAGNOSIS REQUIRED)    Answer:  fetal lie    Order Specific Question:  Preferred imaging location?   Answer:  Internal  . POCT urinalysis dipstick   No orders of the defined types were placed in this encounter.   Follow up in 1 Week with Dr. Clearance Coots to meet him.

## 2016-01-07 ENCOUNTER — Encounter: Payer: Self-pay | Admitting: Obstetrics

## 2016-01-07 ENCOUNTER — Other Ambulatory Visit: Payer: Medicaid Other

## 2016-01-07 ENCOUNTER — Ambulatory Visit (INDEPENDENT_AMBULATORY_CARE_PROVIDER_SITE_OTHER): Payer: Medicaid Other

## 2016-01-07 ENCOUNTER — Ambulatory Visit (INDEPENDENT_AMBULATORY_CARE_PROVIDER_SITE_OTHER): Payer: Medicaid Other | Admitting: Obstetrics

## 2016-01-07 ENCOUNTER — Encounter: Payer: Medicaid Other | Admitting: Obstetrics

## 2016-01-07 VITALS — BP 107/72 | HR 84 | Wt 159.0 lb

## 2016-01-07 DIAGNOSIS — Z3483 Encounter for supervision of other normal pregnancy, third trimester: Secondary | ICD-10-CM

## 2016-01-07 DIAGNOSIS — N760 Acute vaginitis: Secondary | ICD-10-CM

## 2016-01-07 DIAGNOSIS — Z3A36 36 weeks gestation of pregnancy: Secondary | ICD-10-CM

## 2016-01-07 DIAGNOSIS — Z36 Encounter for antenatal screening of mother: Secondary | ICD-10-CM | POA: Diagnosis not present

## 2016-01-07 DIAGNOSIS — A499 Bacterial infection, unspecified: Secondary | ICD-10-CM

## 2016-01-07 DIAGNOSIS — B9689 Other specified bacterial agents as the cause of diseases classified elsewhere: Secondary | ICD-10-CM

## 2016-01-07 LAB — POCT URINALYSIS DIPSTICK
Bilirubin, UA: NEGATIVE
Blood, UA: NEGATIVE
GLUCOSE UA: NEGATIVE
Ketones, UA: NEGATIVE
LEUKOCYTES UA: NEGATIVE
Nitrite, UA: NEGATIVE
PROTEIN UA: NEGATIVE
Spec Grav, UA: 1.015
UROBILINOGEN UA: NEGATIVE
pH, UA: 6.5

## 2016-01-07 MED ORDER — METRONIDAZOLE 0.75 % VA GEL: 1.0000 | Freq: Two times a day (BID) | VAGINAL | Status: DC

## 2016-01-07 NOTE — Progress Notes (Signed)
Subjective:    Julia [name] is a 30 y.o. female being seen today for her obstetrical visit. She is at [redacted]w[redacted]d gestation. Patient reports no complaints. Fetal movement: normal.  Problem List Items Addressed This Visit    None    Visit Diagnoses    BV (bacterial vaginosis)    -  Primary    Relevant Medications    metroNIDAZOLE (METROGEL VAGINAL) 0.75 % vaginal gel      Patient Active Problem List   Diagnosis Date Noted  . Pelvic pain 01/21/2014  . Galactorrhea 04/08/2013  . Dysmenorrhea 04/08/2013  . Vaginitis and vulvovaginitis, unspecified 04/08/2013  . Chronic cholecystitis 03/18/2013  . Unspecified constipation 01/27/2013  . Anemia 01/23/2013  . Abdominal pain 01/22/2013  . Nausea and vomiting 01/22/2013  . Pyelonephritis 01/22/2013   Objective:    BP 107/72 mmHg  Pulse 84  Wt 159 lb (72.122 kg)  LMP 04/21/2015 FHT:  150 BPM  Uterine Size: size equals dates  Presentation: cephalic     Assessment:    Pregnancy @ [redacted]w[redacted]d weeks   Plan:     labs reviewed, problem list updated Consent signed. GBS sent TDAP offered  Rhogam given for RH negative Pediatrician: discussed. Infant feeding: plans to breastfeed. Maternity leave: discussed. Cigarette smoking: never smoked. No orders of the defined types were placed in this encounter.   Meds ordered this encounter  Medications  . metroNIDAZOLE (METROGEL VAGINAL) 0.75 % vaginal gel    Sig: Place 1 Applicatorful vaginally 2 (two) times daily.    Dispense:  70 g    Refill:  0   Follow up in 1 Week.

## 2016-01-07 NOTE — Progress Notes (Signed)
Pt is having increase numbness in hands, having some knee pain

## 2016-01-14 ENCOUNTER — Ambulatory Visit (INDEPENDENT_AMBULATORY_CARE_PROVIDER_SITE_OTHER): Payer: Medicaid Other | Admitting: Certified Nurse Midwife

## 2016-01-14 VITALS — BP 122/82 | HR 98 | Temp 98.1°F | Wt 161.0 lb

## 2016-01-14 DIAGNOSIS — Z3483 Encounter for supervision of other normal pregnancy, third trimester: Secondary | ICD-10-CM

## 2016-01-14 LAB — POCT URINALYSIS DIPSTICK
Bilirubin, UA: NEGATIVE
Glucose, UA: NEGATIVE
KETONES UA: NEGATIVE
Leukocytes, UA: NEGATIVE
Nitrite, UA: NEGATIVE
PH UA: 7
RBC UA: NEGATIVE
SPEC GRAV UA: 1.01
UROBILINOGEN UA: NEGATIVE

## 2016-01-14 NOTE — Progress Notes (Signed)
Subjective:    Julia [name] is a 30 y.o. female being seen today for her obstetrical visit. She is at [redacted]w[redacted]d gestation. Patient reports no complaints. Fetal movement: normal.  Problem List Items Addressed This Visit    None    Visit Diagnoses    Encounter for supervision of other normal pregnancy in third trimester    -  Primary    Relevant Orders    POCT urinalysis dipstick (Completed)      Patient Active Problem List   Diagnosis Date Noted  . Pelvic pain 01/21/2014  . Galactorrhea 04/08/2013  . Dysmenorrhea 04/08/2013  . Vaginitis and vulvovaginitis, unspecified 04/08/2013  . Chronic cholecystitis 03/18/2013  . Unspecified constipation 01/27/2013  . Anemia 01/23/2013  . Abdominal pain 01/22/2013  . Nausea and vomiting 01/22/2013  . Pyelonephritis 01/22/2013    Objective:    BP 122/82 mmHg  Pulse 98  Temp(Src) 98.1 F (36.7 C)  Wt 161 lb (73.029 kg)  LMP 04/21/2015 FHT: 145 BPM  Uterine Size: size equals dates  Presentations: cephalic  Pelvic Exam:              Dilation: 1cm       Effacement: Long             Station:  Floating    Consistency: soft            Position: posterior     Assessment:    Pregnancy @ [redacted]w[redacted]d weeks   Doing well Plan:   Plans for delivery: Vaginal anticipated; labs reviewed; problem list updated Counseling: Consent signed. Infant feeding: plans to breastfeed. Cigarette smoking: never smoked. L&D discussion: symptoms of labor, discussed when to call, discussed what number to call, anesthetic/analgesic options reviewed and delivering clinician:  plans no preference. Postpartum supports and preparation: circumcision discussed and contraception plans discussed.  Follow up in 1 Week.

## 2016-01-14 NOTE — Progress Notes (Signed)
Pt is doing well.  Pt states that she is still having knee pain.

## 2016-01-16 IMAGING — CT CT ABD-PELV W/ CM
1 of 2 series · 15 of 32 positions shown, 19 images · IV contrast (omnipaque)
Comparison: 10/02/2013

CLINICAL DATA: Epigastric pain for several months. Nausea and
vomiting. Previous cholecystectomy. Gradual persistent and
progressively worsening epigastric abdominal pain onset several
months ago. Patient has been seen by a primary care physician and GI
with normal endoscopy. Vomiting.

EXAM:
CT ABDOMEN AND PELVIS WITH CONTRAST
TECHNIQUE: Multidetector CT imaging of the abdomen and pelvis was performed
using the standard protocol following bolus administration of
intravenous contrast.
CONTRAST:  50mL OMNIPAQUE IOHEXOL 300 MG/ML SOLN, 100mL OMNIPAQUE
IOHEXOL 300 MG/ML SOLN

[Series 2: abd/pel with · axial · 0.74mm/px · z∈[-426,-61]mm · 15 of 81 slices shown, 19 images]
[im 4/81  soft-tissue]
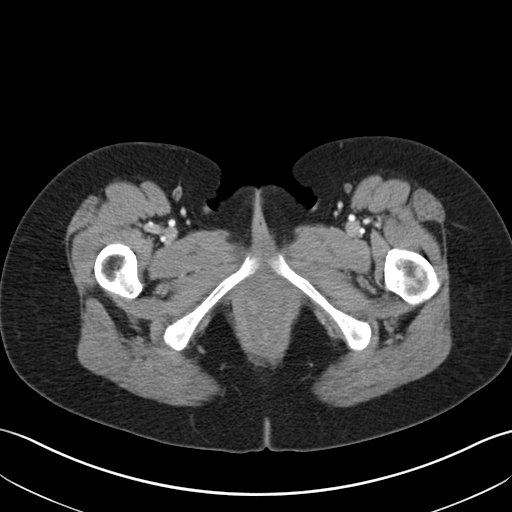
[im 4/81  bone]
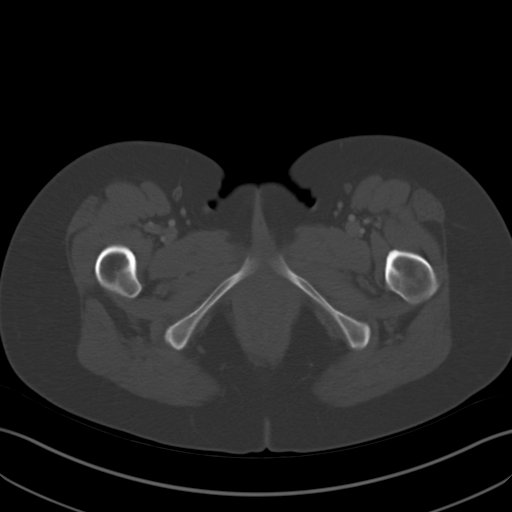
[im 10/81  soft-tissue]
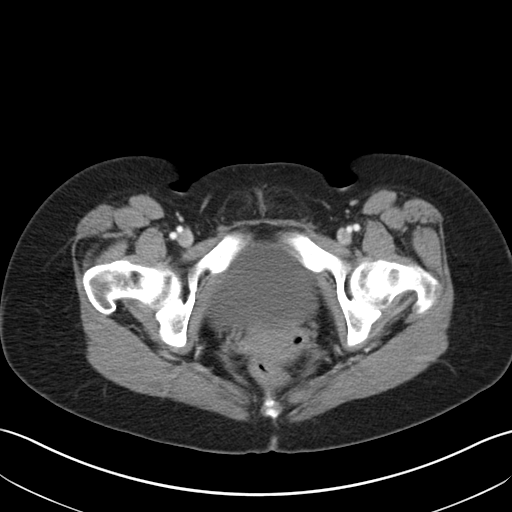
[im 17/81  soft-tissue]
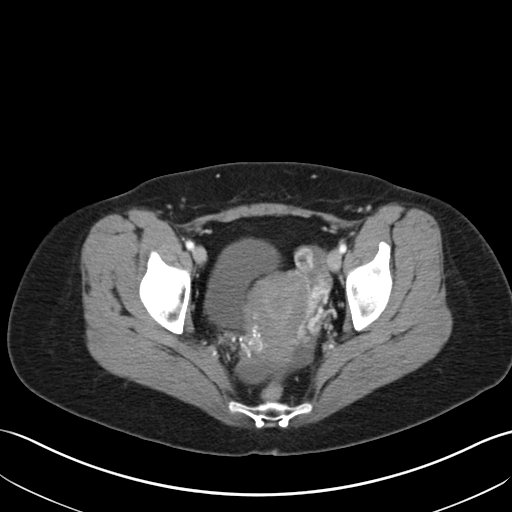
[im 23/81  soft-tissue]
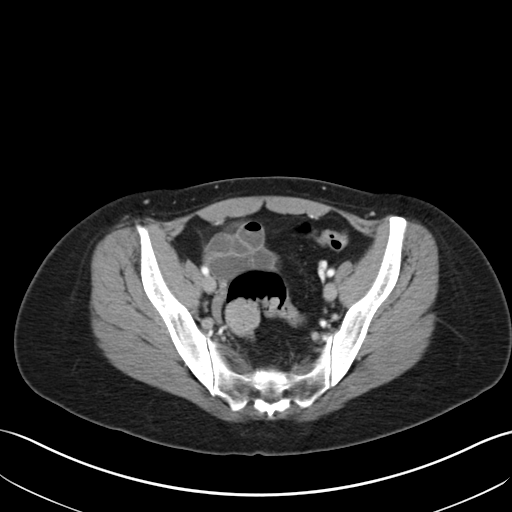
[im 29/81  soft-tissue]
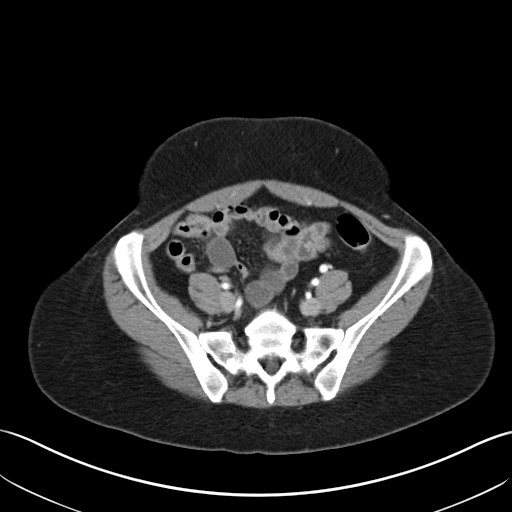
[im 36/81  soft-tissue]
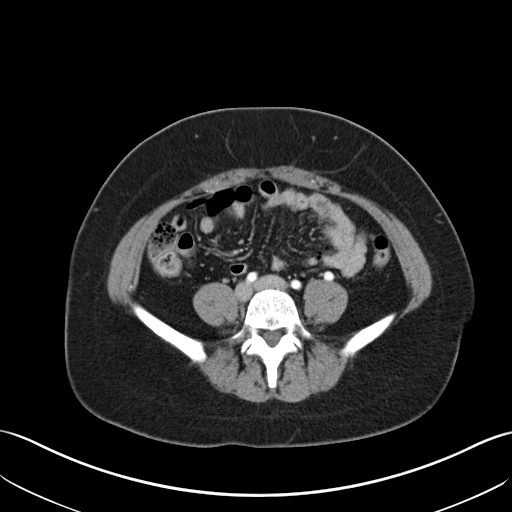
[im 42/81  soft-tissue]
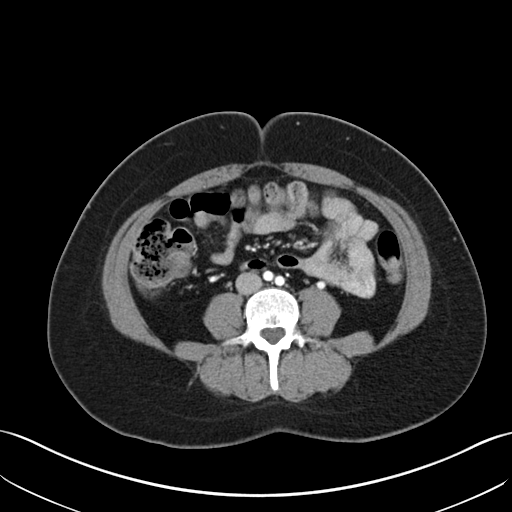
[im 45/81  soft-tissue]
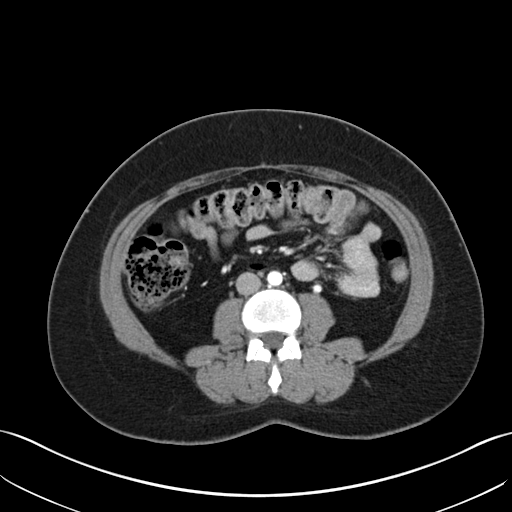
[im 52/81  soft-tissue]
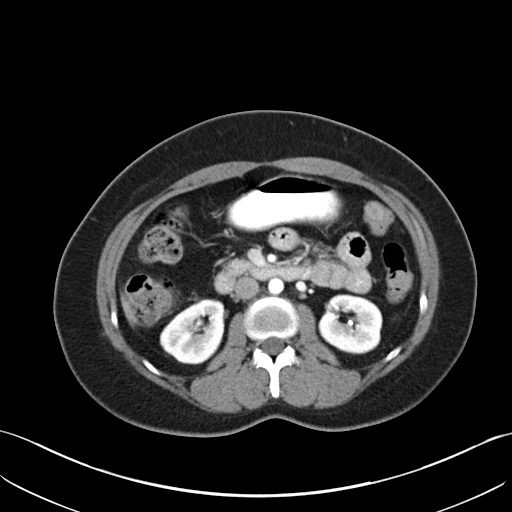
[im 52/81  bone]
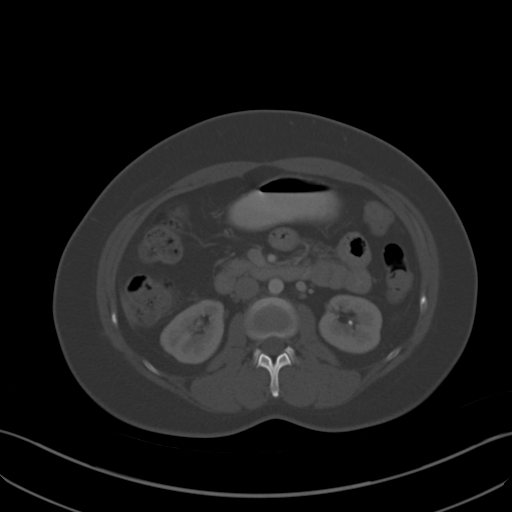
[im 58/81  soft-tissue]
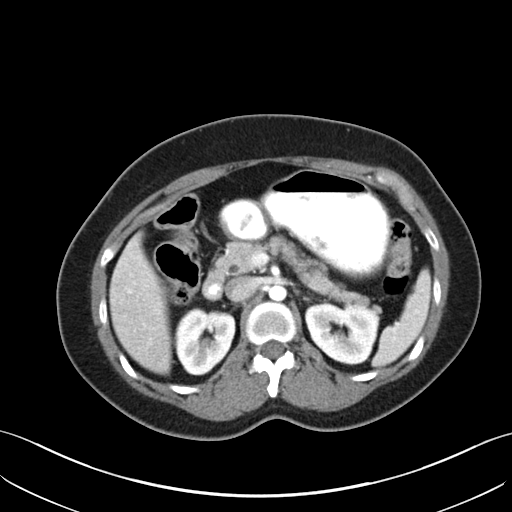
[im 65/81  soft-tissue]
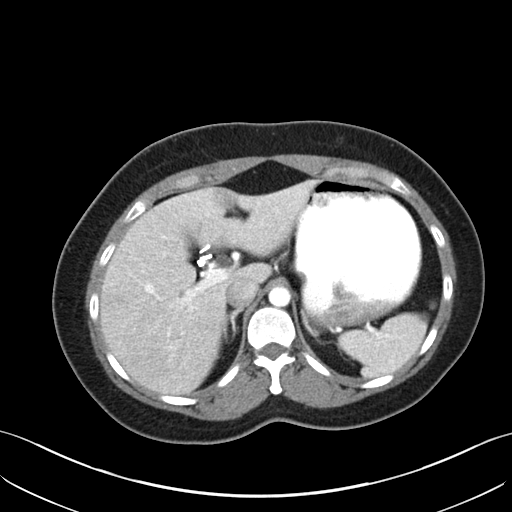
[im 68/81  lung]
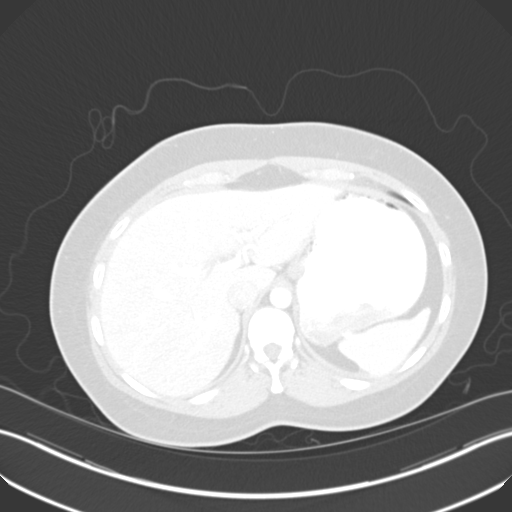
[im 71/81  soft-tissue]
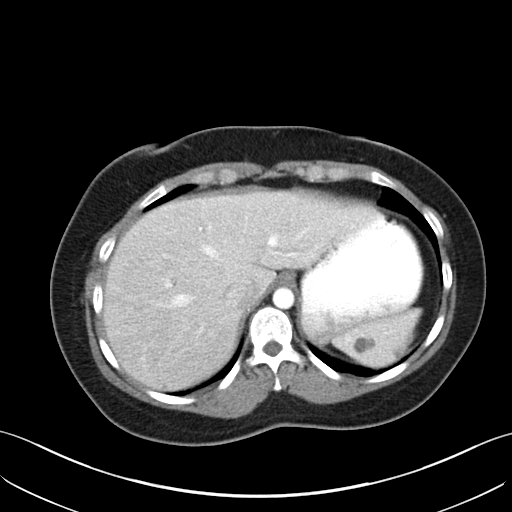
[im 71/81  lung]
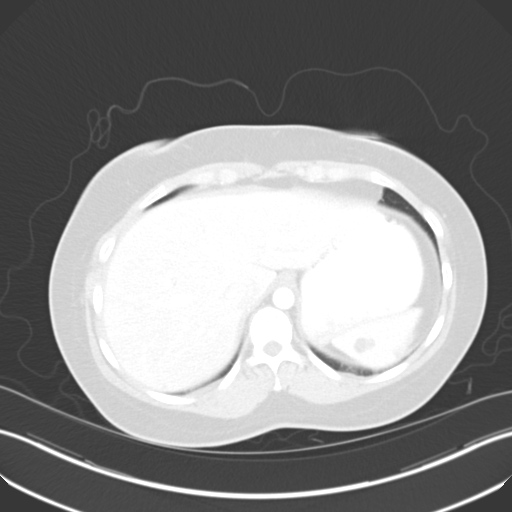
[im 74/81  lung]
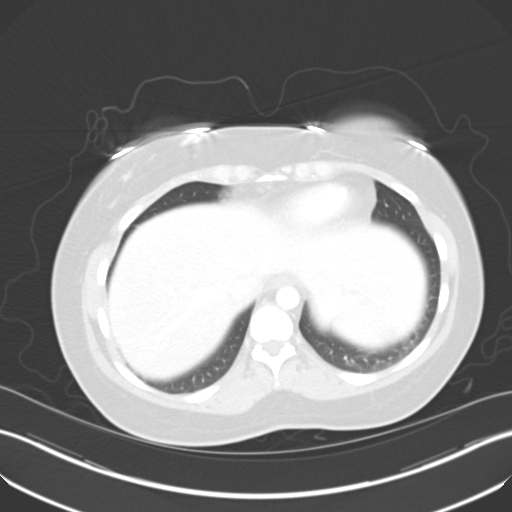
[im 77/81  soft-tissue]
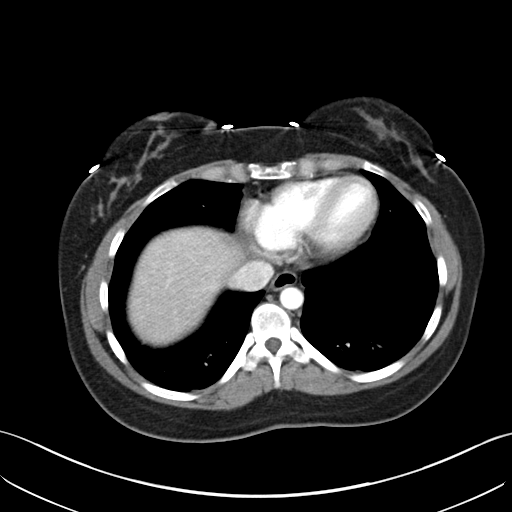
[im 77/81  lung]
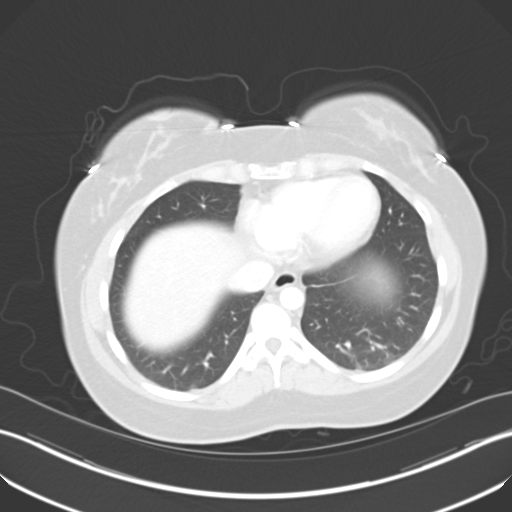

[15 of 32 positions shown; findings below may reference images not displayed]

FINDINGS: Lung bases are clear.

Surgical absence of the gallbladder. Circumscribed low-attenuation
lesion in the spleen measures 14 mm diameter, similar prior study.
This likely represents a cyst. The pancreas, adrenal glands,
abdominal aorta, inferior vena cava, and retroperitoneal lymph nodes
are unremarkable. 4 mm stone in the lower pole left kidney without
evidence of obstruction. Right kidney is unremarkable. Stomach and
small bowel appear normal although under distention may limit
evaluation of the small bowel. Diffusely stool-filled colon without
abnormal distention. No free air or free fluid in the abdomen.
Abdominal wall musculature appears intact.

Pelvis: Appendix is normal. Probable involuting cyst in the left
ovary. Small amount of free fluid in the pelvis is likely
physiologic. Uterus and ovaries are not enlarged. Bladder wall is
not thickened. No pelvic mass or lymphadenopathy. Normal alignment
of the lumbar spine. No destructive bone lesions.
IMPRESSION: No acute process demonstrated in the abdomen or pelvis. Unchanged
appearance of probable splenic cyst and nonobstructing stone in the
lower pole left kidney. Involuting cyst in the left ovary with small
amount of free physiologic pelvic fluid.

## 2016-01-21 ENCOUNTER — Encounter: Payer: Medicaid Other | Admitting: Certified Nurse Midwife

## 2016-01-21 ENCOUNTER — Telehealth: Payer: Self-pay | Admitting: *Deleted

## 2016-01-21 NOTE — Telephone Encounter (Signed)
Patient had a question about some results. Attempted to call her back- left message on voice mail.

## 2016-01-26 ENCOUNTER — Ambulatory Visit (INDEPENDENT_AMBULATORY_CARE_PROVIDER_SITE_OTHER): Payer: Medicaid Other | Admitting: Certified Nurse Midwife

## 2016-01-26 VITALS — BP 129/83 | HR 88 | Temp 98.0°F | Wt 164.0 lb

## 2016-01-26 DIAGNOSIS — Z3483 Encounter for supervision of other normal pregnancy, third trimester: Secondary | ICD-10-CM

## 2016-01-26 DIAGNOSIS — O368131 Decreased fetal movements, third trimester, fetus 1: Secondary | ICD-10-CM | POA: Diagnosis not present

## 2016-01-26 LAB — POCT URINALYSIS DIPSTICK
Bilirubin, UA: NEGATIVE
Glucose, UA: NEGATIVE
Ketones, UA: NEGATIVE
Leukocytes, UA: NEGATIVE
Nitrite, UA: NEGATIVE
PROTEIN UA: NEGATIVE
RBC UA: NEGATIVE
SPEC GRAV UA: 1.01
UROBILINOGEN UA: NEGATIVE
pH, UA: 6.5

## 2016-01-26 NOTE — Progress Notes (Signed)
Subjective:    Julia [name] is a 30 y.o. female being seen today for her obstetrical visit. She is at 109w2d gestation. Patient reports backache, no bleeding, no contractions, no cramping, no leaking and swelling in her hands. Fetal movement: decreased.  Problem List Items Addressed This Visit    None    Visit Diagnoses    Supervision of other normal pregnancy, antepartum, third trimester    -  Primary    Relevant Orders    POCT urinalysis dipstick (Completed)      Patient Active Problem List   Diagnosis Date Noted  . Pelvic pain 01/21/2014  . Galactorrhea 04/08/2013  . Dysmenorrhea 04/08/2013  . Vaginitis and vulvovaginitis, unspecified 04/08/2013  . Chronic cholecystitis 03/18/2013  . Unspecified constipation 01/27/2013  . Anemia 01/23/2013  . Abdominal pain 01/22/2013  . Nausea and vomiting 01/22/2013  . Pyelonephritis 01/22/2013    Objective:    BP 129/83 mmHg  Pulse 88  Temp(Src) 98 F (36.7 C)  Wt 164 lb (74.39 kg)  LMP 04/21/2015 FHT: 155 BPM  Uterine Size: size equals dates  Presentations: cephalic  Pelvic Exam:              Dilation: 1cm       Effacement: Long             Station:  -3    Consistency: soft            Position: posterior   NST: +accels, no decels, moderate variability.    Assessment:    Pregnancy @ [redacted]w[redacted]d weeks   Reactive NST  Plan:   Plans for delivery: Vaginal anticipated; labs reviewed; problem list updated Counseling: Consent signed. Infant feeding: plans to breastfeed. Cigarette smoking: never smoked. L&D discussion: symptoms of labor, discussed when to call, discussed what number to call, anesthetic/analgesic options reviewed and delivering clinician:  plans Certified Nurse-Midwife. Postpartum supports and preparation: circumcision discussed and contraception plans discussed.  Follow up on Thursday at 0730 for IOL at [facility].

## 2016-01-28 ENCOUNTER — Encounter: Payer: Medicaid Other | Admitting: Certified Nurse Midwife

## 2016-01-28 ENCOUNTER — Inpatient Hospital Stay (HOSPITAL_COMMUNITY)
Admission: RE | Admit: 2016-01-28 | Discharge: 2016-01-31 | DRG: 775 | Disposition: A | Discharge status: Home / Self Care | Payer: Medicaid Other | Source: Ambulatory Visit | Attending: Obstetrics | Admitting: Obstetrics

## 2016-01-28 ENCOUNTER — Inpatient Hospital Stay (HOSPITAL_COMMUNITY): Payer: Medicaid Other | Admitting: Anesthesiology

## 2016-01-28 ENCOUNTER — Encounter (HOSPITAL_COMMUNITY): Payer: Self-pay

## 2016-01-28 DIAGNOSIS — Z833 Family history of diabetes mellitus: Secondary | ICD-10-CM

## 2016-01-28 DIAGNOSIS — K219 Gastro-esophageal reflux disease without esophagitis: Secondary | ICD-10-CM | POA: Diagnosis present

## 2016-01-28 DIAGNOSIS — O26893 Other specified pregnancy related conditions, third trimester: Secondary | ICD-10-CM | POA: Diagnosis present

## 2016-01-28 DIAGNOSIS — Z8249 Family history of ischemic heart disease and other diseases of the circulatory system: Secondary | ICD-10-CM | POA: Diagnosis not present

## 2016-01-28 DIAGNOSIS — O9902 Anemia complicating childbirth: Secondary | ICD-10-CM | POA: Diagnosis present

## 2016-01-28 DIAGNOSIS — Z3A39 39 weeks gestation of pregnancy: Secondary | ICD-10-CM

## 2016-01-28 DIAGNOSIS — D509 Iron deficiency anemia, unspecified: Secondary | ICD-10-CM | POA: Diagnosis present

## 2016-01-28 DIAGNOSIS — O9962 Diseases of the digestive system complicating childbirth: Secondary | ICD-10-CM | POA: Diagnosis present

## 2016-01-28 DIAGNOSIS — IMO0002 Reserved for concepts with insufficient information to code with codable children: Secondary | ICD-10-CM | POA: Diagnosis present

## 2016-01-28 LAB — CBC
HCT: 29.8 % — ABNORMAL LOW (ref 36.0–46.0)
HEMOGLOBIN: 9.7 g/dL — AB (ref 12.0–15.0)
MCH: 25.9 pg — ABNORMAL LOW (ref 26.0–34.0)
MCHC: 32.6 g/dL (ref 30.0–36.0)
MCV: 79.7 fL (ref 78.0–100.0)
PLATELETS: 317 10*3/uL (ref 150–400)
RBC: 3.74 MIL/uL — ABNORMAL LOW (ref 3.87–5.11)
RDW: 14.8 % (ref 11.5–15.5)
WBC: 9.3 10*3/uL (ref 4.0–10.5)

## 2016-01-28 LAB — TYPE AND SCREEN
ABO/RH(D): A POS
ANTIBODY SCREEN: NEGATIVE

## 2016-01-28 LAB — COMPREHENSIVE METABOLIC PANEL
ALBUMIN: 2.7 g/dL — AB (ref 3.5–5.0)
ALK PHOS: 285 U/L — AB (ref 38–126)
ALT: 14 U/L (ref 14–54)
ANION GAP: 11 (ref 5–15)
AST: 29 U/L (ref 15–41)
BUN: 12 mg/dL (ref 6–20)
CHLORIDE: 105 mmol/L (ref 101–111)
CO2: 16 mmol/L — AB (ref 22–32)
Calcium: 9.1 mg/dL (ref 8.9–10.3)
Creatinine, Ser: 0.73 mg/dL (ref 0.44–1.00)
GFR calc Af Amer: >60 mL/min (ref >60)
GFR calc non Af Amer: >60 mL/min (ref >60)
GLUCOSE: 175 mg/dL — AB (ref 65–99)
POTASSIUM: 3.7 mmol/L (ref 3.5–5.1)
SODIUM: 132 mmol/L — AB (ref 135–145)
Total Bilirubin: 0.3 mg/dL (ref 0.3–1.2)
Total Protein: 6.1 g/dL — ABNORMAL LOW (ref 6.5–8.1)

## 2016-01-28 MED ORDER — LIDOCAINE HCL (PF) 1 % IJ SOLN
INTRAMUSCULAR | Status: DC | PRN
  Administered 2016-01-28 (×2): 8 mL via EPIDURAL

## 2016-01-28 MED ORDER — EPHEDRINE 5 MG/ML INJ
10.0000 mg | INTRAVENOUS | Status: DC | PRN
  Filled 2016-01-28: qty 2

## 2016-01-28 MED ORDER — MISOPROSTOL 200 MCG PO TABS
50.0000 ug | ORAL_TABLET | ORAL | Status: DC
  Administered 2016-01-28: 50 ug via ORAL
  Filled 2016-01-28 (×2): qty 0.5

## 2016-01-28 MED ORDER — LIDOCAINE HCL (PF) 1 % IJ SOLN
30.0000 mL | INTRAMUSCULAR | Status: DC | PRN
  Filled 2016-01-28: qty 30

## 2016-01-28 MED ORDER — ACETAMINOPHEN 325 MG PO TABS
650.0000 mg | ORAL_TABLET | ORAL | Status: DC | PRN
  Administered 2016-01-29 (×2): 650 mg via ORAL
  Filled 2016-01-28 (×2): qty 2

## 2016-01-28 MED ORDER — TERBUTALINE SULFATE 1 MG/ML IJ SOLN
0.2500 mg | Freq: Once | INTRAMUSCULAR | Status: DC | PRN
  Filled 2016-01-28: qty 1

## 2016-01-28 MED ORDER — FENTANYL CITRATE (PF) 100 MCG/2ML IJ SOLN: 100.0000 ug | INTRAMUSCULAR | Status: DC | PRN

## 2016-01-28 MED ORDER — FENTANYL 2.5 MCG/ML BUPIVACAINE 1/10 % EPIDURAL INFUSION (WH - ANES)
14.0000 mL/h | INTRAMUSCULAR | Status: DC | PRN
  Administered 2016-01-28 – 2016-01-29 (×4): 14 mL/h via EPIDURAL
  Filled 2016-01-28 (×3): qty 125

## 2016-01-28 MED ORDER — PHENYLEPHRINE 40 MCG/ML (10ML) SYRINGE FOR IV PUSH (FOR BLOOD PRESSURE SUPPORT)
80.0000 ug | PREFILLED_SYRINGE | INTRAVENOUS | Status: DC | PRN
  Filled 2016-01-28 (×2): qty 20
  Filled 2016-01-28: qty 2

## 2016-01-28 MED ORDER — LACTATED RINGERS IV SOLN
INTRAVENOUS | Status: DC
Start: 2016-01-28 — End: 2016-01-29
  Administered 2016-01-28 (×2): via INTRAVENOUS

## 2016-01-28 MED ORDER — OXYTOCIN 10 UNIT/ML IJ SOLN
2.5000 [IU]/h | INTRAVENOUS | Status: DC
  Administered 2016-01-29: 39.96 [IU]/h via INTRAVENOUS

## 2016-01-28 MED ORDER — OXYTOCIN BOLUS FROM INFUSION
500.0000 mL | INTRAVENOUS | Status: DC
Start: 2016-01-28 — End: 2016-01-29

## 2016-01-28 MED ORDER — CITRIC ACID-SODIUM CITRATE 334-500 MG/5ML PO SOLN: 30.0000 mL | ORAL | Status: DC | PRN

## 2016-01-28 MED ORDER — OXYTOCIN 10 UNIT/ML IJ SOLN: 10.0000 [IU] | Freq: Once | INTRAMUSCULAR | Status: DC

## 2016-01-28 MED ORDER — LACTATED RINGERS IV SOLN
500.0000 mL | Freq: Once | INTRAVENOUS | Status: AC
  Administered 2016-01-28: 500 mL via INTRAVENOUS

## 2016-01-28 MED ORDER — FLEET ENEMA 7-19 GM/118ML RE ENEM: 1.0000 | ENEMA | RECTAL | Status: DC | PRN

## 2016-01-28 MED ORDER — OXYCODONE-ACETAMINOPHEN 5-325 MG PO TABS: 1.0000 | ORAL_TABLET | ORAL | Status: DC | PRN

## 2016-01-28 MED ORDER — OXYTOCIN 10 UNIT/ML IJ SOLN
1.0000 m[IU]/min | INTRAVENOUS | Status: DC
  Administered 2016-01-28: 2 m[IU]/min via INTRAVENOUS
  Filled 2016-01-28: qty 4

## 2016-01-28 MED ORDER — LACTATED RINGERS IV SOLN
500.0000 mL | INTRAVENOUS | Status: DC | PRN
  Administered 2016-01-29: 500 mL via INTRAVENOUS

## 2016-01-28 MED ORDER — DIPHENHYDRAMINE HCL 50 MG/ML IJ SOLN: 12.5000 mg | INTRAMUSCULAR | Status: DC | PRN

## 2016-01-28 MED ORDER — ONDANSETRON HCL 4 MG/2ML IJ SOLN: 4.0000 mg | Freq: Four times a day (QID) | INTRAMUSCULAR | Status: DC | PRN

## 2016-01-28 MED ORDER — PHENYLEPHRINE 40 MCG/ML (10ML) SYRINGE FOR IV PUSH (FOR BLOOD PRESSURE SUPPORT)
80.0000 ug | PREFILLED_SYRINGE | INTRAVENOUS | Status: DC | PRN
  Filled 2016-01-28: qty 2

## 2016-01-28 MED ORDER — OXYCODONE-ACETAMINOPHEN 5-325 MG PO TABS: 2.0000 | ORAL_TABLET | ORAL | Status: DC | PRN

## 2016-01-28 NOTE — Progress Notes (Signed)
Julia [name] is a 30 y.o. [phone] at [redacted]w[redacted]d by ultrasound admitted for induction of labor due to Elective at term.  Subjective:   Objective: BP 100/80 mmHg  Pulse 90  Temp(Src) 98.5 F (36.9 C) (Axillary)  Resp 16  Ht 5' (1.524 m)  Wt 164 lb (74.39 kg)  BMI 32.03 kg/m2  LMP 04/21/2015      FHT:  FHR: 145 bpm, variability: minimal ,  accelerations:  Present,  decelerations:  Absent UC:   irregular, every 3-5 minutes SVE:   Dilation: 4 Effacement (%): 60 Station: -2 Exam by:: lee  Labs: Lab Results  Component Value Date   WBC 9.3 01/28/2016   HGB 9.7* 01/28/2016   HCT 29.8* 01/28/2016   MCV 79.7 01/28/2016   PLT 317 01/28/2016    Assessment / Plan: Induction of labor due to term with favorable cervix,  progressing well on pitocin  Labor: Progressing on Pitocin, will continue to increase then AROM Preeclampsia:  no signs or symptoms of toxicity Fetal Wellbeing:  Category II d/t Fentanyl IVP Pain Control:  IV pain meds I/D:  n/a Anticipated MOD:  NSVD  Roe Coombs, CNM 01/28/2016, 1:11 PM

## 2016-01-28 NOTE — Progress Notes (Signed)
Monitors replaced after epidural. 

## 2016-01-28 NOTE — Consults (Signed)
  Anesthesia Pain Consult Note  Patient: Julia [name], 30 y.o., female  Consult Requested by: Roe Coombs, CNM  Reason for Consult: CRNA pain rounding  Level of Consciousness: alert  Pain: none   Pain goal: 5

## 2016-01-28 NOTE — H&P (Signed)
Julia [name] is a 30 y.o. female presenting for elective induction at term. History OB History    Gravida Para Term Preterm AB TAB SAB Ectopic Multiple Living   0 1 0 1 0 0 2     Past Medical History  Diagnosis Date  . Chlamydia 2007  . Infection   . Anxiety   . Kidney stones   . GERD (gastroesophageal reflux disease)   . Back pain   . Arthritis   . Depression   . Migraine   . Dyspepsia   . Sinusitis   . Right sided weakness   . Complication of anesthesia   . PONV (postoperative nausea and vomiting)    Past Surgical History  Procedure Laterality Date  . Dilation and curettage of uterus    . Colonoscopy  2012  . Upper gastrointestinal endoscopy  2012  . Cholecystectomy N/A 04/15/2013    Procedure: LAPAROSCOPIC CHOLECYSTECTOMY;  Surgeon: Shelly Rubenstein, MD;  Location: [facility] OR;  Service: General;  Laterality: N/A;  . Esophagogastroduodenoscopy (egd) with propofol N/A 04/02/2014    Procedure: ESOPHAGOGASTRODUODENOSCOPY (EGD) WITH PROPOFOL;  Surgeon: Willis Modena, MD;  Location: [facility] ENDOSCOPY;  Service: Endoscopy;  Laterality: N/A;  . Eus N/A 04/02/2014    Procedure: ESOPHAGEAL ENDOSCOPIC ULTRASOUND (EUS) RADIAL;  Surgeon: Willis Modena, MD;  Location: [facility] ENDOSCOPY;  Service: Endoscopy;  Laterality: N/A;  . Esophageal manometry N/A 03/02/2015    Procedure: ESOPHAGEAL MANOMETRY (EM);  Surgeon: Willis Modena, MD;  Location: [facility] ENDOSCOPY;  Service: Endoscopy;  Laterality: N/A;   Family History: family history includes Diabetes in her mother; Diabetes type II in her mother; Hyperlipidemia in her father; Hypertension in her father and mother. There is no history of Anesthesia problems. Social History:  reports that she has never smoked. She has never used smokeless tobacco. She reports that she does not drink alcohol or use illicit drugs.   Prenatal Transfer Tool  Maternal Diabetes: No Genetic Screening: Normal Maternal Ultrasounds/Referrals: Normal Fetal  Ultrasounds or other Referrals:  None Maternal Substance Abuse:  No Significant Maternal Medications:  None Significant Maternal Lab Results:  None Other Comments:  None  ROS  Dilation: 4 Effacement (%): 60 Station: -2 Exam by:: lee Blood pressure 100/80, pulse 90, temperature 98.5 F (36.9 C), temperature source Axillary, resp. rate 16, height 5' (1.524 m), weight 164 lb (74.39 kg), last menstrual period 04/21/2015. Exam Physical Exam  Prenatal labs: ABO, Rh: --/--/A POS (02/16 0800) Antibody: NEG (02/16 0800) Rubella: 2.11 (07/28 1440) RPR: NON REAC (11/23 1103)  HBsAg: NEGATIVE (07/28 1440)  HIV: NONREACTIVE (11/23 1103)  GBS: NOT DETECTED (01/20 1504)   Assessment/Plan: IOL elective at term.  Admit.  Cytotec, foley bulb, Pitocin.     Julia [name] A Julia [name] 01/28/2016, 1:09 PM

## 2016-01-28 NOTE — Anesthesia Procedure Notes (Signed)
Epidural Patient location during procedure: OB Start time: 01/28/2016 6:14 PM End time: 01/28/2016 6:18 PM  Staffing Anesthesiologist: Leilani Able Performed by: anesthesiologist   Preanesthetic Checklist Completed: patient identified, surgical consent, pre-op evaluation, timeout performed, IV checked, risks and benefits discussed and monitors and equipment checked  Epidural Patient position: sitting Prep: site prepped and draped and DuraPrep Patient monitoring: continuous pulse ox and blood pressure Approach: midline Location: L3-L4 Injection technique: LOR air  Needle:  Needle type: Tuohy  Needle gauge: 17 G Needle length: 9 cm and 9 Needle insertion depth: 5 cm cm Catheter type: closed end flexible Catheter size: 19 Gauge Catheter at skin depth: 10 cm Test dose: negative and Other  Assessment Sensory level: T9 Events: blood not aspirated, injection not painful, no injection resistance, negative IV test and no paresthesia  Additional Notes Reason for block:procedure for pain

## 2016-01-28 NOTE — Progress Notes (Signed)
PCEA button pushed, but pump had been stopped. Patient denies anyone being near the pump. Pump was started and PCEA given.

## 2016-01-28 NOTE — Anesthesia Preprocedure Evaluation (Signed)
Anesthesia Evaluation  Patient identified by MRN, date of birth, ID band Patient awake    Reviewed: Allergy & Precautions, H&P , NPO status , Patient's Chart, lab work & pertinent test results  Airway Mallampati: II  TM Distance: >3 FB Neck ROM: full    Dental no notable dental hx.    Pulmonary neg pulmonary ROS,    Pulmonary exam normal breath sounds clear to auscultation       Cardiovascular negative cardio ROS Normal cardiovascular exam     Neuro/Psych    GI/Hepatic Neg liver ROS,   Endo/Other  negative endocrine ROS  Renal/GU      Musculoskeletal   Abdominal (+) + obese,   Peds  Hematology negative hematology ROS (+)   Anesthesia Other Findings   Reproductive/Obstetrics (+) Pregnancy                             Anesthesia Physical Anesthesia Plan  ASA: II  Anesthesia Plan: Epidural   Post-op Pain Management:    Induction:   Airway Management Planned:   Additional Equipment:   Intra-op Plan:   Post-operative Plan:   Informed Consent: I have reviewed the patients History and Physical, chart, labs and discussed the procedure including the risks, benefits and alternatives for the proposed anesthesia with the patient or authorized representative who has indicated his/her understanding and acceptance.     Plan Discussed with:   Anesthesia Plan Comments:         Anesthesia Quick Evaluation

## 2016-01-28 NOTE — Progress Notes (Signed)
Monitors off for epidural. 

## 2016-01-29 ENCOUNTER — Encounter (HOSPITAL_COMMUNITY): Payer: Self-pay

## 2016-01-29 LAB — RPR: RPR: NONREACTIVE

## 2016-01-29 MED ORDER — OXYTOCIN 10 UNIT/ML IJ SOLN: 2.5000 [IU]/h | INTRAVENOUS | Status: DC | PRN

## 2016-01-29 MED ORDER — FLEET ENEMA 7-19 GM/118ML RE ENEM: 1.0000 | ENEMA | Freq: Every day | RECTAL | Status: DC | PRN

## 2016-01-29 MED ORDER — LACTATED RINGERS IV SOLN
INTRAVENOUS | Status: DC
  Administered 2016-01-29 (×2): via INTRAUTERINE

## 2016-01-29 MED ORDER — IBUPROFEN 600 MG PO TABS
600.0000 mg | ORAL_TABLET | Freq: Four times a day (QID) | ORAL | Status: DC
  Administered 2016-01-29 – 2016-01-31 (×8): 600 mg via ORAL
  Filled 2016-01-29 (×8): qty 1

## 2016-01-29 MED ORDER — MEDROXYPROGESTERONE ACETATE 150 MG/ML IM SUSP: 150.0000 mg | INTRAMUSCULAR | Status: DC | PRN

## 2016-01-29 MED ORDER — OXYCODONE-ACETAMINOPHEN 5-325 MG PO TABS: 1.0000 | ORAL_TABLET | ORAL | Status: DC | PRN

## 2016-01-29 MED ORDER — DIBUCAINE 1 % RE OINT: 1.0000 "application " | TOPICAL_OINTMENT | RECTAL | Status: DC | PRN

## 2016-01-29 MED ORDER — BISACODYL 10 MG RE SUPP: 10.0000 mg | Freq: Every day | RECTAL | Status: DC | PRN

## 2016-01-29 MED ORDER — LANOLIN HYDROUS EX OINT: TOPICAL_OINTMENT | CUTANEOUS | Status: DC | PRN

## 2016-01-29 MED ORDER — DIPHENHYDRAMINE HCL 25 MG PO CAPS: 25.0000 mg | ORAL_CAPSULE | Freq: Four times a day (QID) | ORAL | Status: DC | PRN

## 2016-01-29 MED ORDER — MEASLES, MUMPS & RUBELLA VAC ~~LOC~~ INJ
0.5000 mL | INJECTION | Freq: Once | SUBCUTANEOUS | Status: DC
Start: 2016-01-30 — End: 2016-01-31
  Filled 2016-01-29: qty 0.5

## 2016-01-29 MED ORDER — CEFAZOLIN SODIUM-DEXTROSE 2-3 GM-% IV SOLR
2.0000 g | Freq: Three times a day (TID) | INTRAVENOUS | Status: DC
  Administered 2016-01-29: 2 g via INTRAVENOUS
  Filled 2016-01-29 (×3): qty 50

## 2016-01-29 MED ORDER — OXYCODONE-ACETAMINOPHEN 5-325 MG PO TABS: 2.0000 | ORAL_TABLET | ORAL | Status: DC | PRN

## 2016-01-29 MED ORDER — ZOLPIDEM TARTRATE 5 MG PO TABS: 5.0000 mg | ORAL_TABLET | Freq: Every evening | ORAL | Status: DC | PRN

## 2016-01-29 MED ORDER — BENZOCAINE-MENTHOL 20-0.5 % EX AERO
1.0000 "application " | INHALATION_SPRAY | CUTANEOUS | Status: DC | PRN
  Administered 2016-01-29: 1 via TOPICAL
  Filled 2016-01-29: qty 56

## 2016-01-29 MED ORDER — ONDANSETRON HCL 4 MG/2ML IJ SOLN: 4.0000 mg | INTRAMUSCULAR | Status: DC | PRN

## 2016-01-29 MED ORDER — ACETAMINOPHEN 325 MG PO TABS
650.0000 mg | ORAL_TABLET | ORAL | Status: DC | PRN
  Administered 2016-01-29 – 2016-01-30 (×4): 650 mg via ORAL
  Filled 2016-01-29 (×4): qty 2

## 2016-01-29 MED ORDER — WITCH HAZEL-GLYCERIN EX PADS
1.0000 "application " | MEDICATED_PAD | CUTANEOUS | Status: DC | PRN
  Administered 2016-01-30: 1 via TOPICAL

## 2016-01-29 MED ORDER — METHYLERGONOVINE MALEATE 0.2 MG/ML IJ SOLN: 0.2000 mg | INTRAMUSCULAR | Status: DC | PRN

## 2016-01-29 MED ORDER — TETANUS-DIPHTH-ACELL PERTUSSIS 5-2.5-18.5 LF-MCG/0.5 IM SUSP
0.5000 mL | Freq: Once | INTRAMUSCULAR | Status: AC
  Administered 2016-01-29: 0.5 mL via INTRAMUSCULAR

## 2016-01-29 MED ORDER — SENNOSIDES-DOCUSATE SODIUM 8.6-50 MG PO TABS
2.0000 | ORAL_TABLET | ORAL | Status: DC
  Administered 2016-01-29 – 2016-01-30 (×2): 2 via ORAL
  Filled 2016-01-29 (×2): qty 2

## 2016-01-29 MED ORDER — PRENATAL MULTIVITAMIN CH
1.0000 | ORAL_TABLET | Freq: Every day | ORAL | Status: DC
  Administered 2016-01-30: 1 via ORAL
  Filled 2016-01-29: qty 1

## 2016-01-29 MED ORDER — ONDANSETRON HCL 4 MG PO TABS: 4.0000 mg | ORAL_TABLET | ORAL | Status: DC | PRN

## 2016-01-29 MED ORDER — FERROUS SULFATE 325 (65 FE) MG PO TABS
325.0000 mg | ORAL_TABLET | Freq: Two times a day (BID) | ORAL | Status: DC
  Administered 2016-01-29 – 2016-01-31 (×4): 325 mg via ORAL
  Filled 2016-01-29 (×4): qty 1

## 2016-01-29 MED ORDER — SIMETHICONE 80 MG PO CHEW: 80.0000 mg | CHEWABLE_TABLET | ORAL | Status: DC | PRN

## 2016-01-29 MED ORDER — METHYLERGONOVINE MALEATE 0.2 MG PO TABS: 0.2000 mg | ORAL_TABLET | ORAL | Status: DC | PRN

## 2016-01-29 NOTE — Progress Notes (Signed)
UR chart review completed.  

## 2016-01-29 NOTE — Lactation Note (Signed)
This note was copied from a baby's chart. Lactation Consultation Note  Mother resting. Ex BF 2 years with one child and a few months with other. Baby has breastfed x1.  Has pacifier in crib.  Pacifier use not recommended at this time. Provided education. Mom encouraged to feed baby 8-12 times/24 hours and with feeding cues.  Mother states she has hand expressed and viewed drops. Encouraged her to call if she needs assistance. Provided mother with breastfeeding brochure.     Patient Name: Julia [name] Today's Date: 01/29/2016     Maternal Data    Feeding Feeding Type: Breast Fed Length of feed: 15 min  LATCH Score/Interventions Latch: Grasps breast easily, tongue down, lips flanged, rhythmical sucking.  Audible Swallowing: A few with stimulation Intervention(s): Skin to skin;Hand expression Intervention(s): Skin to skin;Hand expression  Type of Nipple: Everted at rest and after stimulation  Comfort (Breast/Nipple): Soft / non-tender     Hold (Positioning): Assistance needed to correctly position infant at breast and maintain latch. Intervention(s): Breastfeeding basics reviewed;Support Pillows;Position options;Skin to skin  LATCH Score: 8  Lactation Tools Discussed/Used     Consult Status      Dahlia Byes [facility] 01/29/2016, 11:43 AM

## 2016-01-29 NOTE — Consult Note (Signed)
[facility]  Delivery Note:  SVD      01/29/2016  9:07 AM  I was called to the delivery room at the request of the patient's obstetrician (Dr. Clearance Coots) for shoulder dystocia.  PRENATAL HX:  This is a 30 y/o G4P2012 at 42 and 5/7 weeks admitted for IOL on 2/16.  AROM with light meconium on 2/16 at 1837 (ROM ~12 hours).  GBS negative.  30 second shoulder dystocia at delivery.   DELIVERY:  HR > 100 but infant floppy with apnea so PPV administered by L and D team.  NICU team arrived at 2 minutes and infant was breathing spontaneously.  APGARs 2 and 9.  Exam notable for tachycardia and mild retractions, though saturations in mid 90s.  Tachycardia and retractions starting to resolve.  After 10 minutes, baby left with nurse to assist parents with skin-to-skin care, L and D staff to call NICU if any concerns.    _____________________ Electronically Signed By: Maryan Char, MD Neonatologist

## 2016-01-29 NOTE — Telephone Encounter (Signed)
Pt has been seen in office since note. Note to be closed.

## 2016-01-29 NOTE — Anesthesia Postprocedure Evaluation (Signed)
Anesthesia Post Note  Patient: Julia [name]  Procedure(s) Performed: * No procedures listed *  Patient location during evaluation: Mother Baby Anesthesia Type: Epidural Level of consciousness: awake and alert and oriented Pain management: satisfactory to patient Vital Signs Assessment: post-procedure vital signs reviewed and stable Respiratory status: spontaneous breathing and nonlabored ventilation Cardiovascular status: stable Postop Assessment: no headache, no backache, no signs of nausea or vomiting, adequate PO intake and patient able to bend at knees (patient up walking) Anesthetic complications: no    Last Vitals:  Filed Vitals:   01/29/16 1213 01/29/16 1605  BP: 117/60 107/60  Pulse: 90 87  Temp: 36.9 C 36.8 C  Resp: 18 18    Last Pain:  Filed Vitals:   01/29/16 1607  PainSc: 3                  Immanuel Fedak

## 2016-01-30 LAB — CBC
HCT: 24.5 % — ABNORMAL LOW (ref 36.0–46.0)
HEMOGLOBIN: 7.9 g/dL — AB (ref 12.0–15.0)
MCH: 25.7 pg — AB (ref 26.0–34.0)
MCHC: 32.2 g/dL (ref 30.0–36.0)
MCV: 79.8 fL (ref 78.0–100.0)
PLATELETS: 222 10*3/uL (ref 150–400)
RBC: 3.07 MIL/uL — ABNORMAL LOW (ref 3.87–5.11)
RDW: 15 % (ref 11.5–15.5)
WBC: 12.1 10*3/uL — ABNORMAL HIGH (ref 4.0–10.5)

## 2016-01-30 NOTE — Lactation Note (Signed)
This note was copied from a baby's chart. Lactation Consultation Note  Patient Name: Julia [name] Date: 01/30/2016 Reason for consult: Follow-up assessment;Breast/nipple pain   Follow up with mom of 38 hour old infant. Infant with 5 BF for 20-45 minutes, 5 bottle feeds of 8-30 cc, 7 voids and 4 stools in last 24 hours. LATCH Scores 9 by bedside RN. Mom reports she has had some nipple tenderness with feedings. It is usually with initial latch but sometimes lasts throughout feeding. Infant cueing to feed, mom latched infant to right breast in cradle hold, encouraged her to use cross cradle or football hold with feedings to assist infant in obtaining a deeper latch. Mom reports no pain with this feeding beyond initial latch. Infant with rhythmic swallows and multiple swallows. Mom reports she feels fuller today. Gave mom manual pump with instructions for use and cleaning in evert breasts are fuller and infant has difficulty latching. Enc mom to BF infant 8-12 x in 24 hours at first feeding cues. Discussed with her that her milk is coming in and that infant demonstrated good milk transfer and that continued bottles may contribute to engorgement. Enc mom to call for assistance as needed. EBM storage reviewed with mom. Will follow up tomorrow.   Maternal Data Formula Feeding for Exclusion: No  Feeding Feeding Type: Breast Fed Length of feed: 15 min  LATCH Score/Interventions Latch: Grasps breast easily, tongue down, lips flanged, rhythmical sucking. Intervention(s): Adjust position;Assist with latch;Breast compression  Audible Swallowing: Spontaneous and intermittent  Type of Nipple: Everted at rest and after stimulation  Comfort (Breast/Nipple): Filling, red/small blisters or bruises, mild/mod discomfort  Problem noted: Mild/Moderate discomfort Interventions (Mild/moderate discomfort): Comfort gels  Hold (Positioning): Assistance needed to correctly position infant at  breast and maintain latch. Intervention(s): Breastfeeding basics reviewed;Support Pillows;Position options;Skin to skin  LATCH Score: 8  Lactation Tools Discussed/Used Pump Review: Setup, frequency, and cleaning   Consult Status Consult Status: Follow-up Date: 01/31/16 Follow-up type: In-patient    Silas Flood Kynslei Art 01/30/2016, 11:13 PM

## 2016-01-30 NOTE — Progress Notes (Signed)
Post Partum Day 1 Subjective: no complaints  Objective: Blood pressure 108/69, pulse 79, temperature 97.6 F (36.4 C), temperature source Oral, resp. rate 18, height 5' (1.524 m), weight 164 lb (74.39 kg), last menstrual period 04/21/2015, SpO2 100 %, unknown if currently breastfeeding.  Physical Exam:  General: alert and no distress Lochia: appropriate Uterine Fundus: firm Incision: none DVT Evaluation: No evidence of DVT seen on physical exam.   Recent Labs  01/28/16 0800 01/30/16 0543  HGB 9.7* 7.9*  HCT 29.8* 24.5*    Assessment/Plan: Plan for discharge tomorrow  Anemia.  Chronic iron deficiency.  Clinically stable.  Iron Rx.   LOS: 2 days   HARPER,CHARLES A 01/30/2016, 7:29 AM

## 2016-01-31 MED ORDER — IBUPROFEN 600 MG PO TABS: 600.0000 mg | ORAL_TABLET | Freq: Four times a day (QID) | ORAL | Status: DC | PRN

## 2016-01-31 MED ORDER — OXYCODONE-ACETAMINOPHEN 5-325 MG PO TABS: 1.0000 | ORAL_TABLET | ORAL | Status: DC | PRN

## 2016-01-31 MED ORDER — FUSION PLUS PO CAPS: 1.0000 | ORAL_CAPSULE | Freq: Every day | ORAL | Status: DC

## 2016-01-31 NOTE — Discharge Summary (Signed)
Obstetric Discharge Summary Reason for Admission: induction of labor Prenatal Procedures: ultrasound Intrapartum Procedures: spontaneous vaginal delivery Postpartum Procedures: none Complications-Operative and Postpartum: none HEMOGLOBIN  Date Value Ref Range Status  01/30/2016 7.9* 12.0 - 15.0 g/dL Final   HCT  Date Value Ref Range Status  01/30/2016 24.5* 36.0 - 46.0 % Final    Physical Exam:  General: alert and no distress Lochia: appropriate Uterine Fundus: firm Incision: none DVT Evaluation: No evidence of DVT seen on physical exam.  Discharge Diagnoses: Term Pregnancy-delivered                                         Anemia.  Chronic iron deficiency.  Clinically stable.  Iron Rx.  Discharge Information: Date: 01/31/2016 Activity: pelvic rest Diet: routine Medications: PNV, Ibuprofen, Colace, Iron and Percocet Condition: stable Instructions: refer to practice specific booklet Discharge to: home Follow-up Information    Follow up with Roe Coombs, CNM In 2 weeks.   Specialty:  Certified Nurse Midwife   Contact information:   [address] [place] [address] [phone]       Newborn Data: Live born female  Birth Weight: 8 lb 9 oz (3885 g) APGAR: 2, 7  Home with mother.  Magdalina Whitehead A 01/31/2016, 5:28 AM

## 2016-01-31 NOTE — Clinical Social Work Maternal (Signed)
  CLINICAL SOCIAL WORK MATERNAL/CHILD NOTE  Patient Details  Name: Julia [name] MRN: [id number] Date of Birth: 1986-11-06  Date:  01/31/2016  Clinical Social Worker Initiating Note:  Norlene Duel, LCSW Date/ Time Initiated:  01/31/16/1027     Child's Name:  Julia [name]   Legal Guardian:   (Parents )   Need for Interpreter:  None   Date of Referral:  01/30/16     Reason for Referral:  Other (Comment)   Referral Source:  Central Nursery   Address:  [address]  [place], [place] [address]  Phone number:   [phone])   Household Members:  Spouse, Minor Children   Natural Supports (not living in the home):  Immediate Family, Extended Family   Professional Supports: None   Employment:  (Spouse is employed)   Type of Work:     Education:      Pensions consultant:  [employer]   Other Resources:  [employer]   Cultural/Religious Considerations Which May Impact Care:  none noted  Strengths:  Ability to meet basic needs , Home prepared for child    Risk Factors/Current Problems:   (Hx of Depression)   Cognitive State:  Able to Concentrate , Alert    Mood/Affect:  Happy    CSW Assessment:  Acknowledged order for social work consult to assess mother's hx of Depression and anxiety. Met with mother who was pleasant and receptive to social work. She is married and have two other dependents ages 22 and 25. MOB reports hx of anxiety and depression when she found out she was pregnant.  "We were not expecting to have any more children - It was a total surprise".    Informed that she sought therapy and eventually embraced the anticipated changes.   She denies any  current symptoms of depression or anxiety. She denies any use of alcohol or illicit drug use during pregnancy.    No acute social concerns noted or reported at this time. Affect and behavior was appropriate during the entire visit. Spouse and other family was present during [place] visit.   Mother informed of social work  Fish farm manager.  CSW Plan/Description:     Discussed signs/symptoms of PP Depression and available resources No further intervention required No barriers to discharge    Aiyannah Fayad J, LCSW 01/31/2016, 10:30 AM

## 2016-01-31 NOTE — Progress Notes (Signed)
Post Partum Day 2 Subjective: no complaints  Objective: Blood pressure 116/52, pulse 86, temperature 97.7 F (36.5 C), temperature source Oral, resp. rate 18, height 5' (1.524 m), weight 164 lb (74.39 kg), last menstrual period 04/21/2015, SpO2 100 %, unknown if currently breastfeeding.  Physical Exam:  General: alert and no distress Lochia: appropriate Uterine Fundus: firm Incision: none DVT Evaluation: No evidence of DVT seen on physical exam.   Recent Labs  01/28/16 0800 01/30/16 0543  HGB 9.7* 7.9*  HCT 29.8* 24.5*    Assessment/Plan: Anemia.  Clinically stable.  Iron Rx Discharge home   LOS: 3 days   Hailley Byers A 01/31/2016, 5:24 AM

## 2016-01-31 NOTE — Lactation Note (Signed)
This note was copied from a baby's chart. Lactation Consultation Note  Baby latched in cradle upon entering. Demonstrated how to turn baby tummy to tummy for feedings and flange bottom lip. Sucks and swallows observed.  Pacifier use not recommended at this time.  Reviewed supply and demand. Mother states her nipples are sore, no skin breakdown and she asked if she should start using a nipple shield. Suggest since mother is being discharged and baby is latching well - no NS is needed at this time. Provided education about NS and the need to post pump.  Mother seemed to understand. Encouraged applying ebm, keeping baby close during feedings, coconut or olive oil to nipples. Reviewed engorgement care and monitoring voids/stools.    Patient Name: Julia [name] [id number] Date: 01/31/2016 Reason for consult: Follow-up assessment   Maternal Data    Feeding Feeding Type: Breast Fed  LATCH Score/Interventions Latch: Grasps breast easily, tongue down, lips flanged, rhythmical sucking. (latched upon entering) Intervention(s): Adjust position;Breast massage  Audible Swallowing: Spontaneous and intermittent  Type of Nipple: Everted at rest and after stimulation  Comfort (Breast/Nipple): Filling, red/small blisters or bruises, mild/mod discomfort  Problem noted: Mild/Moderate discomfort Interventions (Mild/moderate discomfort): Hand expression;Comfort gels  Hold (Positioning): Assistance needed to correctly position infant at breast and maintain latch.  LATCH Score: 8  Lactation Tools Discussed/Used     Consult Status Consult Status: Complete    Hardie Pulley 01/31/2016, 9:33 AM

## 2016-02-04 ENCOUNTER — Encounter: Payer: Medicaid Other | Admitting: Certified Nurse Midwife

## 2016-02-11 ENCOUNTER — Ambulatory Visit (INDEPENDENT_AMBULATORY_CARE_PROVIDER_SITE_OTHER): Payer: Medicaid Other | Admitting: Certified Nurse Midwife

## 2016-02-11 ENCOUNTER — Encounter: Payer: Self-pay | Admitting: Certified Nurse Midwife

## 2016-02-11 NOTE — Progress Notes (Signed)
Patient ID: Julia [name], female   DOB: [birth year], 30 y.o.   MRN: [id number]  Subjective:     Julia [name] is a 30 y.o. female who presents for a postpartum visit. She is 2 weeks postpartum following a spontaneous vaginal delivery. I have fully reviewed the prenatal and intrapartum course. The delivery was at 39 gestational weeks. Outcome: spontaneous vaginal delivery. Anesthesia: epidural. Postpartum course has been normal. Baby's course has been normal. Baby is feeding by breast. Bleeding thin lochia, brown, pink and clots small, occasionally . Bowel function is normal. Bladder function is normal. Patient is not sexually active. Contraception method is abstinence and condoms. Postpartum depression screening: negative.  Tobacco, alcohol and substance abuse history reviewed.  Adult immunizations reviewed including TDAP, rubella and varicella.  The following portions of the patient's history were reviewed and updated as appropriate: allergies, current medications, past family history, past medical history, past social history, past surgical history and problem list.  Review of Systems Pertinent items noted in HPI and remainder of comprehensive ROS otherwise negative.   Objective:    BP 114/78 mmHg  Pulse 78  Temp(Src) 98.4 F (36.9 C)  Wt 145 lb (65.772 kg)  LMP 04/21/2015  Breastfeeding? Yes  General:  alert, cooperative and no distress   Breasts:  inspection negative, no nipple discharge or bleeding, no masses or nodularity palpable  Lungs: clear to auscultation bilaterally  Heart:  regular rate and rhythm, S1, S2 normal, no murmur, click, rub or gallop  Abdomen: soft, non-tender; bowel sounds normal; no masses,  no organomegaly   Vulva:  not evaluated  Vagina: not evaluated  Cervix:  not evaluated  Corpus: not examined  Adnexa:  not evaluated  Rectal Exam: Not performed.          50% of 15 min visit spent on counseling and coordination of care.  Assessment:    Normal 2 week postpartum exam. Pap smear not done at today's visit.  Plan:    1. Contraception: abstinence 2. Planning contraception of condoms 3. Follow up in: 4 weeks or as needed.  2hr GTT for h/o GDM/screening for DM q 3 yrs per ADA recommendations Preconception counseling provided Healthy lifestyle practices reviewed

## 2016-03-10 ENCOUNTER — Ambulatory Visit (INDEPENDENT_AMBULATORY_CARE_PROVIDER_SITE_OTHER): Payer: Medicaid Other | Admitting: Certified Nurse Midwife

## 2016-03-10 ENCOUNTER — Encounter: Payer: Self-pay | Admitting: Certified Nurse Midwife

## 2016-03-10 MED ORDER — NORETHINDRONE 0.35 MG PO TABS: 1.0000 | ORAL_TABLET | Freq: Every day | ORAL | Status: DC

## 2016-03-10 NOTE — Progress Notes (Signed)
Patient ID: Julia [name], female   DOB: [birth year], 30 y.o.   MRN: [id number]  Subjective:     Julia [name] is a 30 y.o. female who presents for a postpartum visit. She is 6 weeks postpartum following a spontaneous vaginal delivery. I have fully reviewed the prenatal and intrapartum course. The delivery was at 39 gestational weeks. Outcome: spontaneous vaginal delivery. Anesthesia: epidural. Postpartum course has been normal. Baby's course has been normal. Baby is feeding by breast. Bleeding thin lochia and yellow discharge, no bleeding. Bowel function is normal. Bladder function is normal. Patient is not sexually active. Contraception method is abstinence. Postpartum depression screening: negative.  Tobacco, alcohol and substance abuse history reviewed.  Adult immunizations reviewed including TDAP, rubella and varicella.  Declines birth control, desires to use condoms.    The following portions of the patient's history were reviewed and updated as appropriate: allergies, current medications, past family history, past medical history, past social history, past surgical history and problem list.  Review of Systems Pertinent items noted in HPI and remainder of comprehensive ROS otherwise negative.   Objective:    BP 108/79 mmHg  Pulse 73  Temp(Src) 98.2 F (36.8 C)  Wt 139 lb (63.05 kg)  Breastfeeding? Yes  General:  alert, cooperative and no distress   Breasts:  inspection negative, no nipple discharge or bleeding, no masses or nodularity palpable  Lungs: clear to auscultation bilaterally  Heart:  regular rate and rhythm, S1, S2 normal, no murmur, click, rub or gallop  Abdomen: soft, non-tender; bowel sounds normal; no masses,  no organomegaly   Vulva:  normal  Vagina: vagina positive for slight cervical prolapse  Cervix:  no cervical motion tenderness  Corpus: normal size, contour, position, consistency, mobility, non-tender  Adnexa:  normal adnexa  Rectal Exam: Not  performed.          50% of 30 min visit spent on counseling and coordination of care.  Assessment:     Normal 6 week postpartum exam. Pap smear not done at today's visit.  Plan:    1. Contraception: abstinence and oral progesterone-only contraceptive 2.  Discussed f/u for annual exam in May 2017 3. Follow up in: 3 months or as needed.  2hr GTT for h/o GDM/screening for DM q 3 yrs per ADA recommendations Preconception counseling provided Healthy lifestyle practices reviewed

## 2016-03-23 ENCOUNTER — Other Ambulatory Visit: Payer: Self-pay | Admitting: Certified Nurse Midwife

## 2016-03-23 ENCOUNTER — Telehealth: Payer: Self-pay | Admitting: *Deleted

## 2016-03-23 DIAGNOSIS — N390 Urinary tract infection, site not specified: Secondary | ICD-10-CM

## 2016-03-23 MED ORDER — NITROFURANTOIN MONOHYD MACRO 100 MG PO CAPS: 100.0000 mg | ORAL_CAPSULE | Freq: Two times a day (BID) | ORAL | Status: DC

## 2016-03-23 NOTE — Telephone Encounter (Signed)
Patient request call back. 3:45 LM on VM to CB 3:49 Patient returned call 5:07 Spoke with patient- she thinks she has a UTI. She is breat feeding and is not allergic to anything. She just started a new job and wants to know if we can send something in. Told patient we could treat her- but if her symptoms do not get better - we will need to see her.

## 2016-04-02 ENCOUNTER — Telehealth (HOSPITAL_COMMUNITY): Payer: Self-pay | Admitting: Lactation Services

## 2016-04-02 NOTE — Telephone Encounter (Signed)
Mom called back. She decided to schedule an LC appt after all. Appt scheduled for Thursday, 4/27 at 1600.  Glenetta HewKim Marzell Isakson, RN, IBCLC

## 2016-04-02 NOTE — Telephone Encounter (Signed)
Mom called, saying she was having problems breastfeeding her daughter. Lengthy conversation ensued. Mom unsure if 2181-month old, Alexa Donnald GarreLara, was refusing the breast b/c baby had gotten too used to bottle-feeding or b/c Mom had a fast let-down (Mom felt like her milk flowed quickly). Mom initially interested in having an LC appt.to determine cause of refusal/cause of spitting up.  Mom shared that daughter had been presribed Nutramigen, but infant refused it. Pediatrician then prescribed Pregestimil, but [facility] b/c she had not yet wanted to change her feeding package. Mom had not yet found Pregestimil in a retail stores, so, Mom was giving one scoop of Gerber "Gentle" and one scoop of Similac Advance [for a 4-oz bottle] & daughter was tolerating it pretty well. After talking to Mom about method of bottle-feeding, it seemed that the baby was ingesting the bottle too quickly (by mother's description of sound of swallows). Paced bottle-feeding discussed & I encouraged Mom to pump more than once/day in preparation for her LC appt. Once I explained changing the bottle-feeding method she had been using, Mom changed her mind and decided to cancel her recently-made LC appt. Mom says she will call back if needed.  Glenetta HewKim Jamileth Putzier, RN, IBCLC

## 2016-04-07 ENCOUNTER — Ambulatory Visit (HOSPITAL_COMMUNITY)
Admission: RE | Admit: 2016-04-07 | Discharge: 2016-04-07 | Disposition: A | Discharge status: Home / Self Care | Payer: Medicaid Other | Source: Ambulatory Visit | Attending: Certified Nurse Midwife | Admitting: Certified Nurse Midwife

## 2016-04-07 ENCOUNTER — Other Ambulatory Visit (INDEPENDENT_AMBULATORY_CARE_PROVIDER_SITE_OTHER): Payer: Medicaid Other | Admitting: *Deleted

## 2016-04-07 ENCOUNTER — Encounter (HOSPITAL_COMMUNITY): Payer: Medicaid Other

## 2016-04-07 ENCOUNTER — Ambulatory Visit (HOSPITAL_COMMUNITY): Payer: Medicaid Other

## 2016-04-07 VITALS — BP 126/79 | HR 82 | Wt 138.0 lb

## 2016-04-07 DIAGNOSIS — R399 Unspecified symptoms and signs involving the genitourinary system: Secondary | ICD-10-CM

## 2016-04-07 LAB — POCT URINALYSIS DIPSTICK
Bilirubin, UA: NEGATIVE
Blood, UA: 250
Ketones, UA: NEGATIVE
LEUKOCYTES UA: NEGATIVE
NITRITE UA: NEGATIVE
PROTEIN UA: NEGATIVE
Spec Grav, UA: 1.02
UROBILINOGEN UA: NEGATIVE
pH, UA: 5

## 2016-04-07 NOTE — Lactation Note (Signed)
Lactation Consult; Weight today 5184 g  11- 6.9 oz  Mother's reason for visit:  Mom is concerned that baby is spitting up so much- after every feeding and quite a bit each time Visit Type:  Feeding assessment  Consult:  Initial Lactation Consultant:  Audry RilesWeeks, Chevonne Bostrom D  ________________________________________________________________________   Joan FloresBaby's Name: Julia PartridgeAlexa Lara [name] Date of Birth: 01/29/2018 Pediatrician: Marthann SchillerShalom Ped Gender: female Gestational Age: 131w5d (At Birth) Birth Weight: 8 lb 9 oz (3885 g) Weight at Discharge: Weight: 8 lb 3.8 oz (3735 g)Date of Discharge: 01/31/2016 Filed Weights   01/29/16 0851 01/29/16 2351 01/30/16 2344  Weight: 8 lb 9 oz (3885 g) 8 lb 8 oz (3855 g) 8 lb 3.8 oz (3735 g)       ________________________________________________________________________  Mother's Name: Julia HesselbachMaria ArguelloMartine  Breastfeeding Experience:  P3- breast fed first baby for 1 1/2 years and second baby a few months because she had to go back to work and couldn't pump ________________________________________________________________________  Breastfeeding History (Post Discharge)  Frequency of breastfeeding:  5-6 times/day Duration of feeding:  15 min  Supplementation  Formula:  Volume 2-3 oz Lately baby has not been taking bottle      Brand: Nutramigen LIPIL and Pregestimil LIPIL   Method:  Bottle,   Pumping  Type of pump:  Manual Frequency:  1-2 times/day Volume:  1-3 oz  Infant Intake and Output Assessment  Voids:  5-6 in 24 hrs.  Color:  Clear yellow Stools:  2-4 in 24 hrs.  Color:  Yellow  ________________________________________________________________________  Maternal Breast Assessment  Breast:  Soft Nipple:  Erect  _______________________________________________________________________ Feeding Assessment/Evaluation  Initial feeding assessment:  Infant's oral assessment:  WNL  Positioning:  Cradle Left breast  LATCH  documentation:  Latch:  1 = Repeated attempts needed to sustain latch, nipple held in mouth throughout feeding, stimulation needed to elicit sucking reflex.  Audible swallowing:  2 = Spontaneous and intermittent  Type of nipple:  2 = Everted at rest and after stimulation  Comfort (Breast/Nipple):  2 = Soft / non-tender  Hold (Positioning):  2 = No assistance needed to correctly position infant at breast  LATCH score:  9  Attached assessment: deep  Lips flanged:  Yes.    Lips untucked:  No.  Suck assessment:  Nutritive and Nonnutritive  Pre-feed weight:  5184 g  11-6.9 oz Post-feed weight:  5232 g 11- 8.5 oz Amount transferred:  48 ml  Julia content and looking around. Latching on and off the breast. Does do some clicking at the breast in the beginning of the feeding. Nursed on and off for 15 min   Pre-feed weight:  5232 g  11- 8.5 oz Post-feed weight:  5250 g 11- 9.2 oz Amount transferred: 18 ml  Julia again on and off the breast. Content and does not want to latch for more than 5 min. She did not spit up after this feeding, but we did burp her more often than mom usually does. Mom has been on antibiotics for UTI. No signs of thrush. I am concerned that mom states baby weighs the same as her last Ped appointment just over 1 week ago,. Suggested another Ped visit for weight check, OP appointment or to come to [facility]. Mom states she would rather come here to support group. Encouraged mom to call [facility]IC and try to get a DEBP from them as she is going back to work soon and is using manual pump at present. Encouraged to pump at least 2  times/day to promote milk supply.No further questions at present. To call if wants another OP appointment here.     Total amount transferred:  66 ml

## 2016-04-07 NOTE — Progress Notes (Signed)
Pt is in office for UTI symptoms. Udip showed 250 RBC.  UC sent, pt aware that she may call tomorrow for results.  Pt made aware that a culture is needed to determine antibiotic since she has been treated recently. Pt was given samples of Urogesic Blue to help symptom relief.

## 2016-04-08 LAB — URINE CULTURE: ORGANISM ID, BACTERIA: NO GROWTH

## 2016-04-11 ENCOUNTER — Telehealth: Payer: Self-pay | Admitting: *Deleted

## 2016-04-11 NOTE — Telephone Encounter (Signed)
Lab results 5:38 Spoke with patient- let her know that her results were all negative. She did say she is doing a little better. She noticed a small knot area inside her labia.  Recommend warm soaks- she does have an appointment  5/16 and will come in earlier if it gets worse- she is going to see if it goes away. Recommended she increase her fluids- ? If the antispasmodic is helping her symptoms.

## 2016-04-26 ENCOUNTER — Ambulatory Visit: Payer: Medicaid Other | Admitting: Certified Nurse Midwife

## 2016-04-28 ENCOUNTER — Encounter: Payer: Self-pay | Admitting: Certified Nurse Midwife

## 2016-04-28 ENCOUNTER — Ambulatory Visit (INDEPENDENT_AMBULATORY_CARE_PROVIDER_SITE_OTHER): Payer: Medicaid Other | Admitting: Certified Nurse Midwife

## 2016-04-28 VITALS — BP 110/76 | HR 74 | Temp 98.5°F | Wt 136.0 lb

## 2016-04-28 DIAGNOSIS — Z1389 Encounter for screening for other disorder: Secondary | ICD-10-CM

## 2016-04-28 DIAGNOSIS — Z01419 Encounter for gynecological examination (general) (routine) without abnormal findings: Secondary | ICD-10-CM | POA: Diagnosis not present

## 2016-04-28 DIAGNOSIS — Z113 Encounter for screening for infections with a predominantly sexual mode of transmission: Secondary | ICD-10-CM

## 2016-04-28 DIAGNOSIS — R519 Headache, unspecified: Secondary | ICD-10-CM

## 2016-04-28 DIAGNOSIS — Z0000 Encounter for general adult medical examination without abnormal findings: Secondary | ICD-10-CM

## 2016-04-28 DIAGNOSIS — R51 Headache: Secondary | ICD-10-CM

## 2016-04-28 DIAGNOSIS — D500 Iron deficiency anemia secondary to blood loss (chronic): Secondary | ICD-10-CM

## 2016-04-28 LAB — POCT URINALYSIS DIPSTICK
BILIRUBIN UA: NEGATIVE
Glucose, UA: NEGATIVE
Ketones, UA: NEGATIVE
LEUKOCYTES UA: NEGATIVE
NITRITE UA: NEGATIVE
PH UA: 5
PROTEIN UA: NEGATIVE
RBC UA: NEGATIVE
Spec Grav, UA: 1.025
UROBILINOGEN UA: NEGATIVE

## 2016-04-28 MED ORDER — BUTALBITAL-APAP-CAFFEINE 50-325-40 MG PO TABS: 1.0000 | ORAL_TABLET | Freq: Four times a day (QID) | ORAL | Status: DC | PRN

## 2016-04-28 NOTE — Progress Notes (Signed)
Patient ID: Julia [name], female   DOB: Aug 15, 1986, 30 y.o.   MRN: [id number]    Subjective:        Julia [name] is a 30 y.o. female here for a routine exam.  Current complaints: daily HA especially when waking in the morning and NSAIDs are not helping, desires full STD screening.  Breast feeding.  Had period this month, first time since delivery.  Reports decreased sexual desire since delivery, discussed that this is normal especially with breastfeeding.  Is taking her POP on a daily basis at the same time each day.  Does not desire to change her contraception at this time.     Personal health questionnaire:  Is patient [name], have a family history of breast and/or ovarian cancer: no Is there a family history of uterine cancer diagnosed at age < 28, gastrointestinal cancer, urinary tract cancer, family member who is a Personnel officer syndrome-associated carrier: no Is the patient overweight and hypertensive, family history of diabetes, personal history of gestational diabetes, preeclampsia or PCOS: no Is patient over 9, have PCOS,  family history of premature CHD under age 83, diabetes, smoke, have hypertension or peripheral artery disease:  yes At any time, has a partner hit, kicked or otherwise hurt or frightened you?: no Over the past 2 weeks, have you felt down, depressed or hopeless?: no Over the past 2 weeks, have you felt little interest or pleasure in doing things?:no   Gynecologic History No LMP recorded. Contraception: oral progesterone-only contraceptive Last Pap: 04/20/15. Results were: normal Last mammogram: N/A.   Obstetric History OB History  Gravida Para Term Preterm AB SAB TAB Ectopic Multiple Living  4 3 3  0 1 1 0 0 0 3    # Outcome Date GA Lbr Len/2nd Weight Sex Delivery Anes PTL Lv  4 Term 01/29/16 [redacted]w[redacted]d / 00:21 8 lb 9 oz (3.885 kg) F Vag-Spont EPI  Y  3 Term 05/04/07 [redacted]w[redacted]d  7 lb 6 oz (3.345 kg) F Vag-Spont EPI N Y  2 SAB 2007 [redacted]w[redacted]d    SAB      1 Term 07/22/02 [redacted]w[redacted]d  7 lb 6 oz (3.345 kg) M Vag-Spont None N Y      Past Medical History  Diagnosis Date  . Chlamydia 2007  . Infection   . Anxiety   . Kidney stones   . GERD (gastroesophageal reflux disease)   . Back pain   . Arthritis   . Depression   . Migraine   . Dyspepsia   . Sinusitis   . Right sided weakness   . Complication of anesthesia   . PONV (postoperative nausea and vomiting)     Past Surgical History  Procedure Laterality Date  . Dilation and curettage of uterus    . Colonoscopy  2012  . Upper gastrointestinal endoscopy  2012  . Cholecystectomy N/A 04/15/2013    Procedure: LAPAROSCOPIC CHOLECYSTECTOMY;  Surgeon: Shelly Rubenstein, MD;  Location: [facility] OR;  Service: General;  Laterality: N/A;  . Esophagogastroduodenoscopy (egd) with propofol N/A 04/02/2014    Procedure: ESOPHAGOGASTRODUODENOSCOPY (EGD) WITH PROPOFOL;  Surgeon: Willis Modena, MD;  Location: [facility];  Service: Endoscopy;  Laterality: N/A;  . Eus N/A 04/02/2014    Procedure: ESOPHAGEAL ENDOSCOPIC ULTRASOUND (EUS) RADIAL;  Surgeon: Willis Modena, MD;  Location: [facility];  Service: Endoscopy;  Laterality: N/A;  . Esophageal manometry N/A 03/02/2015    Procedure: ESOPHAGEAL MANOMETRY (EM);  Surgeon: Willis Modena, MD;  Location: [facility];  Service: Endoscopy;  Laterality:  N/A;     Current outpatient prescriptions:  .  acetaminophen (TYLENOL) 500 MG tablet, Take 500 mg by mouth every 6 (six) hours as needed., Disp: , Rfl:  .  ibuprofen (ADVIL,MOTRIN) 600 MG tablet, Take 1 tablet (600 mg total) by mouth every 6 (six) hours as needed for mild pain., Disp: 30 tablet, Rfl: 5 .  Iron-FA-B Cmp-C-Biot-Probiotic (FUSION PLUS) CAPS, Take 1 capsule by mouth daily before breakfast., Disp: 30 capsule, Rfl: 5 .  norethindrone (MICRONOR,CAMILA,ERRIN) 0.35 MG tablet, Take 1 tablet (0.35 mg total) by mouth daily., Disp: 1 Package, Rfl: 11 .  Prenatal Vit-Fe Phos-FA-Omega (VITAFOL GUMMIES) 3.33-0.333-34.8  MG CHEW, , Disp: , Rfl:  .  butalbital-acetaminophen-caffeine (FIORICET) 50-325-40 MG tablet, Take 1-2 tablets by mouth every 6 (six) hours as needed for headache., Disp: 45 tablet, Rfl: 4 No Known Allergies  Social History  Substance Use Topics  . Smoking status: Never Smoker   . Smokeless tobacco: Never Used  . Alcohol Use: No    Family History  Problem Relation Age of Onset  . Anesthesia problems Neg Hx   . Diabetes type II Mother   . Hypertension Mother   . Diabetes Mother   . Hyperlipidemia Father   . Hypertension Father       Review of Systems  Constitutional: negative for fatigue and weight loss, +HA Respiratory: negative for cough and wheezing Cardiovascular: negative for chest pain, fatigue and palpitations Gastrointestinal: negative for abdominal pain and change in bowel habits Musculoskeletal:negative for myalgias Neurological: negative for gait problems and tremors Behavioral/Psych: negative for abusive relationship, depression Endocrine: negative for temperature intolerance   Genitourinary:negative for abnormal menstrual periods, genital lesions, hot flashes, sexual problems and vaginal discharge Integument/breast: negative for breast lump, breast tenderness, nipple discharge and skin lesion(s)    Objective:       BP 110/76 mmHg  Pulse 74  Temp(Src) 98.5 F (36.9 C)  Wt 136 lb (61.689 kg) General:   alert  Skin:   no rash or abnormalities  Lungs:   clear to auscultation bilaterally  Heart:   regular rate and rhythm, S1, S2 normal, no murmur, click, rub or gallop  Breasts:   normal without suspicious masses, skin or nipple changes or axillary nodes  Abdomen:  normal findings: no organomegaly, soft, non-tender and no hernia  Pelvis:  External genitalia: normal general appearance Urinary system: urethral meatus normal and bladder without fullness, nontender Vaginal: normal without tenderness, induration or masses Cervix: normal appearance Adnexa: normal  bimanual exam Uterus: anteverted and non-tender, normal size   Lab Review Urine pregnancy test Labs reviewed yes Radiologic studies reviewed no  50% of 30 min visit spent on counseling and coordination of care.   Assessment:    Healthy female exam.   Contraception management  STD screening exam   HA: chronic   Plan:    Education reviewed: calcium supplements, depression evaluation, low fat, low cholesterol diet, safe sex/STD prevention, self breast exams, skin cancer screening and weight bearing exercise. Contraception: oral progesterone-only contraceptive. Follow up in: 1 year.   Meds ordered this encounter  Medications  . butalbital-acetaminophen-caffeine (FIORICET) 50-325-40 MG tablet    Sig: Take 1-2 tablets by mouth every 6 (six) hours as needed for headache.    Dispense:  45 tablet    Refill:  4   Orders Placed This Encounter  Procedures  . CBC with Differential/Platelet  . Hepatitis B surface antigen  . RPR  . Hepatitis C antibody  . HIV antibody  .  Ambulatory referral to Neurology    Referral Priority:  Routine    Referral Type:  Consultation    Referral Reason:  Specialty Services Required    Requested Specialty:  Neurology    Number of Visits Requested:  1  . POCT urinalysis dipstick

## 2016-04-29 LAB — CBC WITH DIFFERENTIAL/PLATELET
BASOS: 0 %
Basophils Absolute: 0 10*3/uL (ref 0.0–0.2)
EOS (ABSOLUTE): 0.2 10*3/uL (ref 0.0–0.4)
EOS: 3 %
HEMATOCRIT: 37.2 % (ref 34.0–46.6)
Hemoglobin: 11.8 g/dL (ref 11.1–15.9)
Immature Grans (Abs): 0 10*3/uL (ref 0.0–0.1)
Immature Granulocytes: 0 %
Lymphocytes Absolute: 2.1 10*3/uL (ref 0.7–3.1)
Lymphs: 37 %
MCH: 25.4 pg — AB (ref 26.6–33.0)
MCHC: 31.7 g/dL (ref 31.5–35.7)
MCV: 80 fL (ref 79–97)
MONOS ABS: 0.4 10*3/uL (ref 0.1–0.9)
Monocytes: 8 %
NEUTROS ABS: 2.9 10*3/uL (ref 1.4–7.0)
Neutrophils: 52 %
PLATELETS: 272 10*3/uL (ref 150–379)
RBC: 4.64 x10E6/uL (ref 3.77–5.28)
RDW: 14.9 % (ref 12.3–15.4)
WBC: 5.5 10*3/uL (ref 3.4–10.8)

## 2016-04-29 LAB — HIV ANTIBODY (ROUTINE TESTING W REFLEX): HIV Screen 4th Generation wRfx: NONREACTIVE

## 2016-04-29 LAB — HEPATITIS B SURFACE ANTIGEN: Hepatitis B Surface Ag: NEGATIVE

## 2016-04-29 LAB — HEPATITIS C ANTIBODY

## 2016-04-29 LAB — RPR: RPR Ser Ql: NONREACTIVE

## 2016-05-03 LAB — PAP IG AND HPV HIGH-RISK
HPV, high-risk: NEGATIVE
PAP SMEAR COMMENT: 0

## 2016-05-03 LAB — NUSWAB VG+, CANDIDA 6SP
CANDIDA ALBICANS, NAA: NEGATIVE
CANDIDA KRUSEI, NAA: NEGATIVE
CANDIDA LUSITANIAE, NAA: NEGATIVE
CANDIDA PARAPSILOSIS, NAA: NEGATIVE
CANDIDA TROPICALIS, NAA: NEGATIVE
CHLAMYDIA TRACHOMATIS, NAA: NEGATIVE
Candida glabrata, NAA: NEGATIVE
Neisseria gonorrhoeae, NAA: NEGATIVE
TRICH VAG BY NAA: NEGATIVE

## 2016-07-01 ENCOUNTER — Ambulatory Visit: Payer: Medicaid Other | Admitting: Neurology

## 2016-07-08 ENCOUNTER — Encounter: Payer: Self-pay | Admitting: Certified Nurse Midwife

## 2016-07-08 ENCOUNTER — Ambulatory Visit (INDEPENDENT_AMBULATORY_CARE_PROVIDER_SITE_OTHER): Payer: Medicaid Other | Admitting: Certified Nurse Midwife

## 2016-07-08 ENCOUNTER — Ambulatory Visit: Payer: Medicaid Other | Admitting: Neurology

## 2016-07-08 VITALS — BP 114/81 | HR 91 | Temp 98.2°F | Wt 132.0 lb

## 2016-07-08 DIAGNOSIS — N611 Abscess of the breast and nipple: Secondary | ICD-10-CM

## 2016-07-08 MED ORDER — FLUCONAZOLE 100 MG PO TABS: 100.0000 mg | ORAL_TABLET | Freq: Once | ORAL | 0 refills | Status: AC

## 2016-07-08 MED ORDER — CEPHALEXIN 500 MG PO CAPS: 500.0000 mg | ORAL_CAPSULE | Freq: Three times a day (TID) | ORAL | 0 refills | Status: DC

## 2016-07-08 NOTE — Progress Notes (Signed)
Patient ID: Julia [name], female   DOB: January 29, 1986, 31 y.o.   MRN: [id number]  Chief Complaint  Patient presents with  . Breast Problem    ?clogged duct, was treated for Mastitis in [place] country-completed antibiotics. still having pain in breast, warm to the touch- no longer feels knot.     HPI Julia [name] is a 30 y.o. female.  History of mastitis and plugged duct since the last 6 weeks.  Was treated in [place] and states that they dilated the duct and performed aspiration.  Is still having excruciating pain when she breast feeds on the right side.  Denies any fevers.  Was treated with an antibiotic in [place], not sure which one.  Desires retreatment today d/t pain.  Is still currently breastfeeding.   HPI  Past Medical History:  Diagnosis Date  . Anxiety   . Arthritis   . Back pain   . Chlamydia 2007  . Complication of anesthesia   . Depression   . Dyspepsia   . GERD (gastroesophageal reflux disease)   . Infection   . Kidney stones   . Migraine   . PONV (postoperative nausea and vomiting)   . Right sided weakness   . Sinusitis     Past Surgical History:  Procedure Laterality Date  . CHOLECYSTECTOMY N/A 04/15/2013   Procedure: LAPAROSCOPIC CHOLECYSTECTOMY;  Surgeon: Shelly Rubenstein, MD;  Location: [facility];  Service: General;  Laterality: N/A;  . COLONOSCOPY  2012  . DILATION AND CURETTAGE OF UTERUS    . ESOPHAGEAL MANOMETRY N/A 03/02/2015   Procedure: ESOPHAGEAL MANOMETRY (EM);  Surgeon: Willis Modena, MD;  Location: [facility];  Service: Endoscopy;  Laterality: N/A;  . ESOPHAGOGASTRODUODENOSCOPY (EGD) WITH PROPOFOL N/A 04/02/2014   Procedure: ESOPHAGOGASTRODUODENOSCOPY (EGD) WITH PROPOFOL;  Surgeon: Willis Modena, MD;  Location: [facility];  Service: Endoscopy;  Laterality: N/A;  . EUS N/A 04/02/2014   Procedure: ESOPHAGEAL ENDOSCOPIC ULTRASOUND (EUS) RADIAL;  Surgeon: Willis Modena, MD;  Location: [facility];  Service: Endoscopy;  Laterality: N/A;  .  UPPER GASTROINTESTINAL ENDOSCOPY  2012    Family History  Problem Relation Age of Onset  . Anesthesia problems Neg Hx   . Diabetes type II Mother   . Hypertension Mother   . Diabetes Mother   . Hyperlipidemia Father   . Hypertension Father     Social History Social History  Substance Use Topics  . Smoking status: Never Smoker  . Smokeless tobacco: Never Used  . Alcohol use No    No Known Allergies  Current Outpatient Prescriptions  Medication Sig Dispense Refill  . norethindrone (MICRONOR,CAMILA,ERRIN) 0.35 MG tablet Take 1 tablet (0.35 mg total) by mouth daily. 1 Package 11  . Prenatal Vit-Fe Phos-FA-Omega (VITAFOL GUMMIES) 3.33-0.333-34.8 MG CHEW     . acetaminophen (TYLENOL) 500 MG tablet Take 500 mg by mouth every 6 (six) hours as needed.    . butalbital-acetaminophen-caffeine (FIORICET) 50-325-40 MG tablet Take 1-2 tablets by mouth every 6 (six) hours as needed for headache. 45 tablet 4  . cephALEXin (KEFLEX) 500 MG capsule Take 1 capsule (500 mg total) by mouth 3 (three) times daily. 21 capsule 0  . fluconazole (DIFLUCAN) 100 MG tablet Take 1 tablet (100 mg total) by mouth once. Repeat dose in 48-72 hour. 3 tablet 0  . ibuprofen (ADVIL,MOTRIN) 600 MG tablet Take 1 tablet (600 mg total) by mouth every 6 (six) hours as needed for mild pain. 30 tablet 5  . Iron-FA-B Cmp-C-Biot-Probiotic (FUSION PLUS) CAPS Take 1 capsule  by mouth daily before breakfast. 30 capsule 5   No current facility-administered medications for this visit.     Review of Systems Review of Systems Constitutional: negative for fatigue and weight loss Respiratory: negative for cough and wheezing Cardiovascular: negative for chest pain, fatigue and palpitations Gastrointestinal: negative for abdominal pain and change in bowel habits Genitourinary:negative Integument/breast: + lactation and breast mass with pain Musculoskeletal:negative for myalgias Neurological: negative for gait problems and  tremors Behavioral/Psych: negative for abusive relationship, depression Endocrine: negative for temperature intolerance     Blood pressure 114/81, pulse 91, temperature 98.2 F (36.8 C), weight 132 lb (59.9 kg), currently breastfeeding.  Physical Exam Physical Exam General:   alert  Skin:   no rash or abnormalities  Lungs:   clear to auscultation bilaterally  Heart:   regular rate and rhythm, S1, S2 normal, no murmur, click, rub or gallop  Breasts:   left sidenormal without suspicious masses, skin or nipple changes or axillary nodes.  Right side:  Mass noted around 12 o'clock near nipple: about 3 cm in size.    Abdomen:  normal findings: no organomegaly, soft, non-tender and no hernia    50% of 20 min visit spent on counseling and coordination of care.   Data Reviewed Previous medical hx, meds  Assessment     Right breast abscess/plugged duct    Plan   [facility] verbal order for nipple cream done  Orders Placed This Encounter  Procedures  . US BREAST LTD UNI RIGHT INC AXILLA    Standing Status:   Future    Standing Expiration Date:   09/08/2017    Order Specific Question:   Reason for Exam (SYMPTOM  OR DIAGNOSIS REQUIRED)    Answer:   right breast abcess, not resolved with antibiotics and aspiration    Order Specific Question:   Preferred imaging location?    Answer:   [facility]   Meds ordered this encounter  Medications  . cephALEXin (KEFLEX) 500 MG capsule    Sig: Take 1 capsule (500 mg total) by mouth 3 (three) times daily.    Dispense:  21 capsule    Refill:  0  . fluconazole (DIFLUCAN) 100 MG tablet    Sig: Take 1 tablet (100 mg total) by mouth once. Repeat dose in 48-72 hour.    Dispense:  3 tablet    Refill:  0     Possible management options include: lactation consult Follow up in 4 weeks.

## 2016-07-09 ENCOUNTER — Other Ambulatory Visit: Payer: Self-pay | Admitting: Certified Nurse Midwife

## 2016-07-11 ENCOUNTER — Inpatient Hospital Stay: Admission: RE | Admit: 2016-07-11 | Payer: Medicaid Other | Source: Ambulatory Visit

## 2016-07-12 ENCOUNTER — Inpatient Hospital Stay (HOSPITAL_COMMUNITY)
Admission: AD | Admit: 2016-07-12 | Discharge: 2016-07-12 | Disposition: A | Discharge status: Home / Self Care | Payer: Medicaid Other | Source: Ambulatory Visit | Attending: Obstetrics & Gynecology | Admitting: Obstetrics & Gynecology

## 2016-07-12 ENCOUNTER — Encounter (HOSPITAL_COMMUNITY): Payer: Self-pay | Admitting: *Deleted

## 2016-07-12 ENCOUNTER — Telehealth: Payer: Self-pay | Admitting: *Deleted

## 2016-07-12 DIAGNOSIS — N611 Abscess of the breast and nipple: Secondary | ICD-10-CM

## 2016-07-12 DIAGNOSIS — O9123 Nonpurulent mastitis associated with lactation: Secondary | ICD-10-CM

## 2016-07-12 DIAGNOSIS — B3789 Other sites of candidiasis: Secondary | ICD-10-CM | POA: Diagnosis not present

## 2016-07-12 DIAGNOSIS — N61 Mastitis without abscess: Secondary | ICD-10-CM | POA: Insufficient documentation

## 2016-07-12 DIAGNOSIS — N644 Mastodynia: Secondary | ICD-10-CM | POA: Diagnosis present

## 2016-07-12 DIAGNOSIS — O9270 Unspecified disorders of lactation: Secondary | ICD-10-CM | POA: Diagnosis not present

## 2016-07-12 LAB — POCT PREGNANCY, URINE: PREG TEST UR: NEGATIVE

## 2016-07-12 MED ORDER — CEPHALEXIN 500 MG PO CAPS: 500.0000 mg | ORAL_CAPSULE | Freq: Four times a day (QID) | ORAL | 0 refills | Status: DC

## 2016-07-12 NOTE — Discharge Instructions (Signed)
Breastfeeding and Mastitis °Mastitis is inflammation of the breast tissue. It can occur in women who are breastfeeding. This can make breastfeeding painful. Mastitis will sometimes go away on its own. Your health care provider will help determine if treatment is needed. °CAUSES °Mastitis is often associated with a blocked milk (lactiferous) duct. This can happen when too much milk builds up in the breast. Causes of excess milk in the breast can include: °· Poor latch-on. If your baby is not latched onto the breast properly, she or he may not empty your breast completely while breastfeeding. °· Allowing too much time to pass between feedings. °· Wearing a bra or other clothing that is too tight. This puts extra pressure on the lactiferous ducts so milk does not flow through them as it should. °Mastitis can also be caused by a bacterial infection. Bacteria may enter the breast tissue through cuts or openings in the skin. In women who are breastfeeding, this may occur because of cracked or irritated skin. Cracks in the skin are often caused when your baby does not latch on properly to the breast. °SIGNS AND SYMPTOMS °· Swelling, redness, tenderness, and pain in an area of the breast. °· Swelling of the glands under the arm on the same side. °· Fever may or may not accompany mastitis. °If an infection is allowed to progress, a collection of pus (abscess) may develop. °DIAGNOSIS  °Your health care provider can usually diagnose mastitis based on your symptoms and a physical exam. Tests may be done to help confirm the diagnosis. These may include: °· Removal of pus from the breast by applying pressure to the area. This pus can be examined in the lab to determine which bacteria are present. If an abscess has developed, the fluid in the abscess can be removed with a needle. This can also be used to confirm the diagnosis and determine the bacteria present. In most cases, pus will not be present. °· Blood tests to determine if  your body is fighting a bacterial infection. °· Mammogram or ultrasound tests to rule out other problems or diseases. °TREATMENT  °Mastitis that occurs with breastfeeding will sometimes go away on its own. Your health care provider may choose to wait 24 hours after first seeing you to decide whether a prescription medicine is needed. If your symptoms are worse after 24 hours, your health care provider will likely prescribe an antibiotic medicine to treat the mastitis. He or she will determine which bacteria are most likely causing the infection and will then select an appropriate antibiotic medicine. This is sometimes changed based on the results of tests performed to identify the bacteria, or if there is no response to the antibiotic medicine selected. Antibiotic medicines are usually given by mouth. You may also be given medicine for pain. °HOME CARE INSTRUCTIONS °· Only take over-the-counter or prescription medicines for pain, fever, or discomfort as directed by your health care provider. °· If your health care provider prescribed an antibiotic medicine, take the medicine as directed. Make sure you finish it even if you start to feel better. °· Do not wear a tight or underwire bra. Wear a soft, supportive bra. °· Increase your fluid intake, especially if you have a fever. °· Continue to empty the breast. Your health care provider can tell you whether this milk is safe for your infant or needs to be thrown out. You may be told to stop nursing until your health care provider thinks it is safe for your baby.   Use a breast pump if you are advised to stop nursing.  Keep your nipples clean and dry.  Empty the first breast completely before going to the other breast. If your baby is not emptying your breasts completely for some reason, use a breast pump to empty your breasts.  If you go back to work, pump your breasts while at work to stay in time with your nursing schedule.  Avoid allowing your breasts to become  overly filled with milk (engorged). SEEK MEDICAL CARE IF:  You have pus-like discharge from the breast.  Your symptoms do not improve with the treatment prescribed by your health care provider within 2 days. SEEK IMMEDIATE MEDICAL CARE IF:  Your pain and swelling are getting worse.  You have pain that is not controlled with medicine.  You have a red line extending from the breast toward your armpit.  You have a fever or persistent symptoms for more than 2-3 days.  You have a fever and your symptoms suddenly get worse. MAKE SURE YOU:   Understand these instructions.  Will watch your condition.  Will get help right away if you are not doing well or get worse.   This information is not intended to replace advice given to you by your health care provider. Make sure you discuss any questions you have with your health care provider.   Document Released: 03/25/2005 Document Revised: 12/03/2013 Document Reviewed: 07/04/2013 Elsevier Interactive Patient Education 2016 [employer].   Breastfeeding Challenges and Solutions Even though breastfeeding is natural, it can be challenging, especially in the first few weeks after childbirth. It is normal for problems to arise when starting to breastfeed your new baby, even if you have breastfed before. This document provides some solutions to the most common breastfeeding challenges.  CHALLENGES AND SOLUTIONS Challenge--Cracked or Sore Nipples Cracked or sore nipples are commonly experienced by breastfeeding mothers. Cracked or sore nipples often are caused by inadequate latching (when your baby's mouth attaches to your breast to breastfeed). Soreness can also happen if your baby is not positioned properly at your breast. Although nipple cracking and soreness are common during the first week after birth, nipple pain is never normal. If you experience nipple cracking or soreness that lasts longer than 1 week or nipple pain, call your health care  provider or lactation consultant.  Solution Ensure proper latching and positioning of your baby by following the steps below:  Find a comfortable place to sit or lie down, with your neck and back well supported.  Place a pillow or rolled up blanket under your baby to bring him or her to the level of your breast (if you are seated).  Make sure that your baby's abdomen is facing your abdomen.  Gently massage your breast. With your fingertips, massage from your chest wall toward your nipple in a circular motion. This encourages milk flow. You may need to continue this action during the feeding if your milk flows slowly.  Support your breast with 4 fingers underneath and your thumb above your nipple. Make sure your fingers are well away from your nipple and your baby's mouth.  Stroke your baby's lips gently with your finger or nipple.  When your baby's mouth is open wide enough, quickly bring your baby to your breast, placing your entire nipple and as much of the colored area around your nipple (areola) as possible into your baby's mouth.  More areola should be visible above your baby's upper lip than below the lower lip.  Your baby's tongue should be between his or her lower gum and your breast.  Ensure that your baby's mouth is correctly positioned around your nipple (latched). Your baby's lips should create a seal on your breast and be turned out (everted).  It is common for your baby to suck for about 2-3 minutes in order to start the flow of breast milk. Signs that your baby has successfully latched on to your nipple include:   Quietly tugging or quietly sucking without causing you pain.   Swallowing heard between every 3-4 sucks.   Muscle movement above and in front of his or her ears with sucking.  Signs that your baby has not successfully latched on to nipple include:   Sucking sounds or smacking sounds from your baby while nursing.   Nipple pain.  Ensure that your  breasts stay moisturized and healthy by:  Avoiding the use of soap on your nipples.   Wearing a supportive bra. Avoid wearing underwire-style bras or tight bras.   Air drying your nipples for 3-4 minutes after each feeding.   Using only cotton bra pads to absorb breast milk leakage. Leaking of breast milk between feedings is normal. Be sure to change the pads if they become soaked with milk.  Using lanolin on your nipples after nursing. Lanolin helps to maintain your skin's normal moisture barrier. If you use pure lanolin you do not need to wash it off before feeding your baby again. Pure lanolin is not toxic to your baby. You may also hand express a few drops of breast milk and gently massage that milk into your nipples, allowing it to air dry. Challenge--Breast Engorgement Breast engorgement is the overfilling of your breasts with breast milk. In the first few weeks after giving birth, you may experience breast engorgement. Breast engorgement can make your breasts throb and feel hard, tightly stretched, warm, and tender. Engorgement peaks about the fifth day after you give birth. Having breast engorgement does not mean you have to stop breastfeeding your baby. Solution  Breastfeed when you feel the need to reduce the fullness of your breasts or when your baby shows signs of hunger. This is called "breastfeeding on demand."  Newborns (babies younger than 4 weeks) often breastfeed every 1-3 hours during the day. You may need to awaken your baby to feed if he or she is asleep at a feeding time.  Do not allow your baby to sleep longer than 5 hours during the night without a feeding.  Pump or hand express breast milk before breastfeeding to soften your breast, areola, and nipple.  Apply warm, moist heat (in the shower or with warm water-soaked hand towels) just before feeding or pumping, or massage your breast before or during breastfeeding. This increases circulation and helps your milk to  flow.  Completely empty your breasts when breastfeeding or pumping. Afterward, wear a snug bra (nursing or regular) or tank top for 1-2 days to signal your body to slightly decrease milk production. Only wear snug bras or tank tops to treat engorgement. Tight bras typically should be avoided by breastfeeding mothers. Once engorgement is relieved, return to wearing regular, loose-fitting clothes.  Apply ice packs to your breasts to lessen the pain from engorgement and relieve swelling, unless the ice is uncomfortable for you.  Do not delay feedings. Try to relax when it is time to feed your baby. This helps to trigger your "let-down reflex," which releases milk from your breast.  Ensure your baby is latched  on to your breast and positioned properly while breastfeeding.  Allow your baby to remain at your breast as long as he or she is latched on well and actively sucking. Your baby will let you know when he or she is done breastfeeding by pulling away from your breast or falling asleep.  Avoid introducing bottles or pacifiers to your baby in the early weeks of breastfeeding. Wait to introduce these things until after resolving any breastfeeding challenges.  Try to pump your milk on the same schedule as when your baby would breastfeed if you are returning to work or away from home for an extended period.  Drink plenty of fluids to avoid dehydration, which can eventually put you at greater risk of breast engorgement. If you follow these suggestions, your engorgement should improve in 24-48 hours. If you are still experiencing difficulty, call your lactation consultant or health care provider.  Challenge--Plugged Milk Ducts Plugged milk ducts occur when the duct does not drain milk effectively and becomes swollen. Wearing a tight-fitting nursing bra or having difficulty with latching may cause plugged milk ducts. Not drinking enough water (8-10 c [1.9-2.4 L] per day) can contribute to plugged milk  ducts. Once a duct has become plugged, hard lumps, soreness, and redness may develop in your breast.  Solution Do not delay feedings. Feed your baby frequently and try to empty your breasts of milk at each feeding. Try breastfeeding from the affected side first so there is a better chance that the milk will drain completely from that breast. Apply warm, moist towels to your breasts for 5-10 minutes before feeding. Alternatively, a hot shower right before breastfeeding can provide the moist heat that can encourage milk flow. Gentle massage of the sore area before and during a feeding may also help. Avoid wearing tight clothing or bras that put pressure on your breasts. Wear bras that offer good support to your breasts, but avoid underwire bras. If you have a plugged milk duct and develop a fever, you need to see your health care provider.  Challenge--Mastitis Mastitis is inflammation of your breast. It usually is caused by a bacterial infection and can cause flu-like symptoms. You may develop redness in your breast and a fever. Often when mastitis occurs, your breast becomes firm, warm, and very painful. The most common causes of mastitis are poor latching, ineffective sucking from your baby, consistent pressure on your breast (possibly from wearing a tight-fitting bra or shirt that restricts the milk flow), unusual stress or fatigue, or missed feedings.  Solution You will be given antibiotic medicine to treat the infection. It is still important to breastfeed frequently to empty your breasts. Continuing to breastfeed while you recover from mastitis will not harm your baby. Make sure your baby is positioned properly during every feeding. Apply moist heat to your breasts for a few minutes before feeding to help the milk flow and to help your breasts empty more easily. Challenge--Thrush Ginette Pitman is a yeast infection that can form on your nipples, in your breast, or in your baby's mouth. It causes itching,  soreness, burning or stabbing pain, and sometimes a rash.  Solution You will be given a medicated ointment for your nipples, and your baby will be given a liquid medicine for his or her mouth. It is important that you and your baby are treated at the same time because thrush can be passed between you and your baby. Change disposable nursing pads often. Any bras, towels, or clothing that come in  contact with infected areas of your body or your baby's body need to be washed in very hot water every day. Wash your hands and your baby's hands often. All pacifiers, bottle nipples, or toys your baby puts in his or her mouth should be boiled once a day for 20 minutes. After 1 week of treatment, discard pacifiers and bottle nipples and buy new ones. All breast pump parts that touch the milk need to be boiled for 20 minutes every day. Challenge--Low Milk Supply You may not be producing enough milk if your baby is not gaining the proper amount of weight. Breast milk production is based on a supply-and-demand system. Your milk supply depends on how frequently and effectively your baby empties your breast. Solution The more you breastfeed and pump, the more breast milk you will produce. It is important that your baby empties at least one of your breasts at each feeding. If this is not happening, then use a breast pump or hand express any milk that remains. This will help to drain as much milk as possible at each feeding. It will also signal your body to produce more milk. If your baby is not emptying your breasts, it may be due to latching, sucking, or positioning problems. If low milk supply continues after addressing these issues, contact your health care provider or a lactation specialist as soon as possible. Challenge--Inverted or Flat Nipples Some women have nipples that turn inward instead of protruding outward. Other women have nipples that are flat. Inverted or flat nipples can sometimes make it more difficult  for your baby to latch onto your breast. Solution You may be given a small device that pulls out inverted nipples. This device should be applied right before your baby is brought to your breast. You can also try using a breast pump for a short time before placing the baby at your breast. The pump can pull your nipple outwards to help your infant latch more easily. The baby's sucking motion will help the inverted nipple protrude as well.  If you have flat nipples, encourage your baby to latch onto your breast and feed frequently in the early days after birth. This will give your baby practice latching on correctly while your breast is still soft. When your milk supply increases, between the second and fifth day after birth and your breasts become full, your baby will have an easier time latching.  Contact a lactation consultant if you still have concerns. She or he can teach you additional techniques to address breastfeeding problems related to nipple shape and position.  FOR MORE INFORMATION [employer]: www.llli.org   This information is not intended to replace advice given to you by your health care provider. Make sure you discuss any questions you have with your health care provider.   Document Released: 05/22/2006 Document Revised: 12/19/2014 Document Reviewed: 05/24/2013 Elsevier Interactive Patient Education [employer]

## 2016-07-12 NOTE — MAU Note (Addendum)
Pt was seen in MD office on 7/28 for breast abscess, is on antibiotics.  Breast pain has continued, now feels warm tender mass on L breast.  Pt states she felt feverish last night but did not take temp.  Pt taking tylenol & ibuprofen for pain, last took ibuprofen @ 1100 today.

## 2016-07-12 NOTE — Telephone Encounter (Signed)
[facility] for breast pain and abscess during breastfeeding. She was told that they would call her to let her know her appointment time. She never got a call and missed her appointment. She is very upset because she wanted that appointment. She had to go to the hospital today to be seen. The next appointment is too far out. Can Barb help her get in sooner? She really needs to be seen. Told patient I would have Barb look inti it and she would call her tomorrow.

## 2016-07-12 NOTE — MAU Provider Note (Signed)
History     CSN: [id number]  Arrival date and time: 07/12/16 1322   First Provider Initiated Contact with Patient 07/12/16 1455      Chief Complaint  Patient presents with  . Breast Pain   [id number], lactating female c/o left breast pain x3 weeks. She was seen in [place] about 3 wks ago, was told she had a blocked duct, needle aspiration done, and given abx. She reports the pain continued and she was seen in the office 4 days ago and started on Keflex, Diflucan, and APNO. She reports the pain has continued. She describes as sharp and burning and worsened during and after feeds. She c/o nipple pain with latch. She thinks she had a fever last night but did not check temp. She denies any change in feeding schedule and is feeding every 1-3 hours. She was scheduled for a breast US yesterday but was not aware and missed the appt.   Past Medical History:  Diagnosis Date  . Anxiety   . Arthritis   . Back pain   . Chlamydia 2007  . Complication of anesthesia   . Depression   . Dyspepsia   . GERD (gastroesophageal reflux disease)   . Infection   . Kidney stones   . Migraine   . PONV (postoperative nausea and vomiting)   . Right sided weakness   . Sinusitis     Past Surgical History:  Procedure Laterality Date  . CHOLECYSTECTOMY N/A 04/15/2013   Procedure: LAPAROSCOPIC CHOLECYSTECTOMY;  Surgeon: Shelly Rubenstein, MD;  Location: [facility] OR;  Service: General;  Laterality: N/A;  . COLONOSCOPY  2012  . DILATION AND CURETTAGE OF UTERUS    . ESOPHAGEAL MANOMETRY N/A 03/02/2015   Procedure: ESOPHAGEAL MANOMETRY (EM);  Surgeon: Willis Modena, MD;  Location: [facility];  Service: Endoscopy;  Laterality: N/A;  . ESOPHAGOGASTRODUODENOSCOPY (EGD) WITH PROPOFOL N/A 04/02/2014   Procedure: ESOPHAGOGASTRODUODENOSCOPY (EGD) WITH PROPOFOL;  Surgeon: Willis Modena, MD;  Location: [facility];  Service: Endoscopy;  Laterality: N/A;  . EUS N/A 04/02/2014   Procedure: ESOPHAGEAL ENDOSCOPIC ULTRASOUND (EUS)  RADIAL;  Surgeon: Willis Modena, MD;  Location: [facility];  Service: Endoscopy;  Laterality: N/A;  . UPPER GASTROINTESTINAL ENDOSCOPY  2012    Family History  Problem Relation Age of Onset  . Diabetes type II Mother   . Hypertension Mother   . Diabetes Mother   . Hyperlipidemia Father   . Hypertension Father   . Anesthesia problems Neg Hx     Social History  Substance Use Topics  . Smoking status: Never Smoker  . Smokeless tobacco: Never Used  . Alcohol use No    Allergies: No Known Allergies  Prescriptions Prior to Admission  Medication Sig Dispense Refill Last Dose  . acetaminophen (TYLENOL) 500 MG tablet Take 1,000 mg by mouth every 6 (six) hours as needed.    07/11/2016 at Unknown time  . cephALEXin (KEFLEX) 500 MG capsule Take 1 capsule (500 mg total) by mouth 3 (three) times daily. 21 capsule 0 07/12/2016 at Unknown time  . ibuprofen (ADVIL,MOTRIN) 600 MG tablet Take 1 tablet (600 mg total) by mouth every 6 (six) hours as needed for mild pain. 30 tablet 5 07/12/2016 at 1100  . Prenatal Vit-Fe Fumarate-FA (PRENATAL MULTIVITAMIN) TABS tablet Take 1 tablet by mouth daily at 12 noon.   07/12/2016 at Unknown time  . butalbital-acetaminophen-caffeine (FIORICET) 50-325-40 MG tablet Take 1-2 tablets by mouth every 6 (six) hours as needed for headache. (Patient not taking: Reported on  07/12/2016) 45 tablet 4 Not Taking at Unknown time  . Iron-FA-B Cmp-C-Biot-Probiotic (FUSION PLUS) CAPS Take 1 capsule by mouth daily before breakfast. (Patient not taking: Reported on 07/12/2016) 30 capsule 5 Not Taking at Unknown time  . norethindrone (MICRONOR,CAMILA,ERRIN) 0.35 MG tablet Take 1 tablet (0.35 mg total) by mouth daily. (Patient not taking: Reported on 07/12/2016) 1 Package 11 Not Taking at Unknown time    Review of Systems  Constitutional: Positive for fever.  Musculoskeletal: Negative.   Skin: Negative.    Physical Exam   Blood pressure 121/78, pulse 82, temperature 97.4 F (36.3 C),  temperature source Oral, resp. rate 18, last menstrual period 06/17/2016, currently breastfeeding.  Physical Exam  Constitutional: She is oriented to person, place, and time. She appears well-developed and well-nourished.  HENT:  Head: Normocephalic and atraumatic.  Neck: Normal range of motion.  Cardiovascular: Normal rate.   Respiratory: Effort normal. Right breast exhibits no inverted nipple, no mass, no nipple discharge, no skin change and no tenderness. Left breast exhibits nipple discharge (milky) and tenderness. Left breast exhibits no inverted nipple, no mass and no skin change.    Musculoskeletal: Normal range of motion.  Neurological: She is alert and oriented to person, place, and time.  Skin: Skin is warm and dry.  Psychiatric: She has a normal mood and affect.   Results for orders placed or performed during the hospital encounter of 07/12/16 (from the past 24 hour(s))  Pregnancy, urine POC     Status: None   Collection Time: 07/12/16  3:09 PM  Result Value Ref Range   Preg Test, Ur NEGATIVE NEGATIVE    MAU Course  Procedures  MDM Mastitis with multiple blocked lactiferous ducts and probably breast yeast. Increased Keflex to q6 hrs and outpt breast US. No evidence of abcess. Stable for discharge home.  Assessment and Plan  Blocked lactiferous ducts Mastitis Candidiasis of breast  Discharge home Increase Keflex to q6 hrs x10 days Continue Flagyl and APNO Warm compresses, massage, and feed or pump q3 hrs Tylenol or Motrin prn pain Breast US in the next wk at the breast center-pt rescheduled in 6 days Follow up in office in 2 wks Return for worsening sx  Donette Larry, CNM 07/12/2016, 2:55 PM

## 2016-07-13 ENCOUNTER — Telehealth: Payer: Self-pay

## 2016-07-14 ENCOUNTER — Other Ambulatory Visit: Payer: Self-pay | Admitting: Certified Nurse Midwife

## 2016-07-14 ENCOUNTER — Ambulatory Visit
Admission: RE | Admit: 2016-07-14 | Discharge: 2016-07-14 | Disposition: A | Discharge status: Home / Self Care | Payer: Medicaid Other | Source: Ambulatory Visit | Attending: Certified Nurse Midwife | Admitting: Certified Nurse Midwife

## 2016-07-14 DIAGNOSIS — N611 Abscess of the breast and nipple: Secondary | ICD-10-CM

## 2016-07-15 NOTE — Telephone Encounter (Signed)
PATIENT WENT TO [facility] 8/3

## 2016-07-19 ENCOUNTER — Other Ambulatory Visit: Payer: Medicaid Other

## 2016-07-29 ENCOUNTER — Encounter: Payer: Self-pay | Admitting: Certified Nurse Midwife

## 2016-07-29 ENCOUNTER — Ambulatory Visit (INDEPENDENT_AMBULATORY_CARE_PROVIDER_SITE_OTHER): Payer: Medicaid Other | Admitting: Certified Nurse Midwife

## 2016-07-29 VITALS — BP 111/76 | HR 76 | Temp 98.4°F | Wt 134.0 lb

## 2016-07-29 DIAGNOSIS — N611 Abscess of the breast and nipple: Secondary | ICD-10-CM | POA: Diagnosis not present

## 2016-07-29 DIAGNOSIS — O9102 Infection of nipple associated with the puerperium: Principal | ICD-10-CM

## 2016-07-29 DIAGNOSIS — B49 Unspecified mycosis: Secondary | ICD-10-CM | POA: Diagnosis not present

## 2016-07-29 DIAGNOSIS — B3789 Other sites of candidiasis: Secondary | ICD-10-CM

## 2016-07-29 MED ORDER — GENTIAN VIOLET 2 % EX SOLN: 0.5000 mL | Freq: Three times a day (TID) | CUTANEOUS | 0 refills | Status: DC

## 2016-07-29 NOTE — Progress Notes (Signed)
Patient ID: Julia [name], female   DOB: [birth year], 30 y.o.   MRN: [id number]  Chief Complaint  Patient presents with  . Follow-up    HPI Julia [name] is a 30 y.o. female.  Here for f/u after breast mastitis episode.  Breast center US was normal no evidence of abscess.  States that she is still having pain on the right side, shooting pain with breast feeding and nipple pain.  Discussed having a bra fitting, is wearing a tight bra currently, not for breast feeding.  Encouraged to go get a fitting and breast feeding bra.  Has been using the nipple cream, states that it is not helping.  States that she now has a bump on her nipple.  Has been using OTC Tylenol for pain and that it helps.  Has not been using any compresses.  States that she is tired of the pain and ready to quit breast feeding, infant is 5 mo. Old.  Encouragement given to keep breast feeding.    HPI  Past Medical History:  Diagnosis Date  . Anxiety   . Arthritis   . Back pain   . Chlamydia 2007  . Complication of anesthesia   . Depression   . Dyspepsia   . GERD (gastroesophageal reflux disease)   . Infection   . Kidney stones   . Migraine   . PONV (postoperative nausea and vomiting)   . Right sided weakness   . Sinusitis     Past Surgical History:  Procedure Laterality Date  . CHOLECYSTECTOMY N/A 04/15/2013   Procedure: LAPAROSCOPIC CHOLECYSTECTOMY;  Surgeon: Shelly Rubensteinouglas A Blackman, MD;  Location: [facility] OR;  Service: General;  Laterality: N/A;  . COLONOSCOPY  2012  . DILATION AND CURETTAGE OF UTERUS    . ESOPHAGEAL MANOMETRY N/A 03/02/2015   Procedure: ESOPHAGEAL MANOMETRY (EM);  Surgeon: Willis ModenaWilliam Outlaw, MD;  Location: [facility];  Service: Endoscopy;  Laterality: N/A;  . ESOPHAGOGASTRODUODENOSCOPY (EGD) WITH PROPOFOL N/A 04/02/2014   Procedure: ESOPHAGOGASTRODUODENOSCOPY (EGD) WITH PROPOFOL;  Surgeon: Willis ModenaWilliam Outlaw, MD;  Location: [facility];  Service: Endoscopy;  Laterality: N/A;  . EUS N/A 04/02/2014   Procedure: ESOPHAGEAL ENDOSCOPIC ULTRASOUND (EUS) RADIAL;  Surgeon: Willis ModenaWilliam Outlaw, MD;  Location: [facility];  Service: Endoscopy;  Laterality: N/A;  . UPPER GASTROINTESTINAL ENDOSCOPY  2012    Family History  Problem Relation Age of Onset  . Diabetes type II Mother   . Hypertension Mother   . Diabetes Mother   . Hyperlipidemia Father   . Hypertension Father   . Anesthesia problems Neg Hx     Social History Social History  Substance Use Topics  . Smoking status: Never Smoker  . Smokeless tobacco: Never Used  . Alcohol use No    No Known Allergies  Current Outpatient Prescriptions  Medication Sig Dispense Refill  . acetaminophen (TYLENOL) 500 MG tablet Take 1,000 mg by mouth every 6 (six) hours as needed.     . cephALEXin (KEFLEX) 500 MG capsule Take 1 capsule (500 mg total) by mouth every 6 (six) hours. 20 capsule 0  . gentian violet 2 % topical solution Apply 0.5 mLs topically 3 (three) times daily. For 3-4 days. 30 mL 0  . ibuprofen (ADVIL,MOTRIN) 600 MG tablet Take 1 tablet (600 mg total) by mouth every 6 (six) hours as needed for mild pain. 30 tablet 5  . Prenatal Vit-Fe Fumarate-FA (PRENATAL MULTIVITAMIN) TABS tablet Take 1 tablet by mouth daily at 12 noon.     No current facility-administered medications for  this visit.     Review of Systems Review of Systems Constitutional: negative for fatigue and weight loss Respiratory: negative for cough and wheezing Cardiovascular: negative for chest pain, fatigue and palpitations Gastrointestinal: negative for abdominal pain and change in bowel habits Genitourinary:negative Integument/breast: + for nipple pain with breast feeding Musculoskeletal:negative for myalgias Neurological: negative for gait problems and tremors Behavioral/Psych: negative for abusive relationship, depression Endocrine: negative for temperature intolerance     Blood pressure 111/76, pulse 76, temperature 98.4 F (36.9 C), weight 134 lb (60.8 kg),  last menstrual period 06/17/2016, currently breastfeeding.  Physical Exam Physical Exam General:   alert  Skin:   no rash or abnormalities  Lungs:   clear to auscultation bilaterally  Heart:   regular rate and rhythm, S1, S2 normal, no murmur, click, rub or gallop  Breasts:   left normal without suspicious masses, skin or nipple changes or axillary nodes  Right side has small pin point raised area near nipple on areola, slight erythema over nipple edges  Abdomen:  normal findings: no organomegaly, soft, non-tender and no hernia  Pelvis:  deferred    50% of 15 min visit spent on counseling and coordination of care.   Data Reviewed Previous medical hx, meds, labs, breast US  Assessment     Probable nipple yeast infection    Plan   Gentian violet oredered No orders of the defined types were placed in this encounter.  Meds ordered this encounter  Medications  . gentian violet 2 % topical solution    Sig: Apply 0.5 mLs topically 3 (three) times daily. For 3-4 days.    Dispense:  30 mL    Refill:  0    Possible management options include: bra fitting, pumping exclusively, quitting breast feeding Follow up as needed.

## 2016-08-08 ENCOUNTER — Telehealth: Payer: Self-pay | Admitting: *Deleted

## 2016-08-08 NOTE — Telephone Encounter (Signed)
Patient states her breast is still bothersome and she is still not better. Patient wants to see Dr Clearance CootsHarper. She has been treated for yeast, and has been given antibiotics. She has been to the breast center and everything checks out. She wants to continue to breast feed. Appointment given

## 2016-08-10 ENCOUNTER — Telehealth (HOSPITAL_COMMUNITY): Payer: Self-pay | Admitting: Lactation Services

## 2016-08-10 ENCOUNTER — Ambulatory Visit (INDEPENDENT_AMBULATORY_CARE_PROVIDER_SITE_OTHER): Payer: Medicaid Other | Admitting: Obstetrics

## 2016-08-10 ENCOUNTER — Encounter: Payer: Self-pay | Admitting: Obstetrics

## 2016-08-10 VITALS — BP 112/68 | HR 75 | Wt 132.0 lb

## 2016-08-10 DIAGNOSIS — N644 Mastodynia: Secondary | ICD-10-CM

## 2016-08-10 DIAGNOSIS — O9229 Other disorders of breast associated with pregnancy and the puerperium: Secondary | ICD-10-CM

## 2016-08-10 NOTE — Telephone Encounter (Signed)
Called by Dr. Clearance CootsHarper for recommendations for this mom regarding clogged milk duct. Mom is breast/bottle feeding. Dr. Clearance CootsHarper and Mom reports nodule on left breast that is tender, no redness or fever. Shooting pain with nursing. Mom reports her milk has come in well. LC advised Mom she needs to be BF with each feeding, at least every 2-3 hours. Encouraged to start the next few feedings on left breast when baby is most vigerious. Prior to nursing baby apply warm compress and do good massage before and while nursing. Post pump as needed for 15 minutes to help soften area. Encouraged since milk in well not to use formula for now, put baby to breast with each feeding. If S/S mastitis develop, call OB for treatment. OP f/u appointment scheduled for Friday, 08/12/16 at 4:00. Mom to call for questions/concerns.

## 2016-08-10 NOTE — Progress Notes (Signed)
Patient ID: Julia [name], female   DOB: [birth year], 30 y.o.   MRN: [id number]  Chief Complaint  Patient presents with  . Follow-up    still having breast problems    HPI Julia [name] is a 30 y.o. female.  Persistent left breast pain. HPI  Past Medical History:  Diagnosis Date  . Anxiety   . Arthritis   . Back pain   . Chlamydia 2007  . Complication of anesthesia   . Depression   . Dyspepsia   . GERD (gastroesophageal reflux disease)   . Infection   . Kidney stones   . Migraine   . PONV (postoperative nausea and vomiting)   . Right sided weakness   . Sinusitis     Past Surgical History:  Procedure Laterality Date  . CHOLECYSTECTOMY N/A 04/15/2013   Procedure: LAPAROSCOPIC CHOLECYSTECTOMY;  Surgeon: Shelly Rubenstein, MD;  Location: [facility] OR;  Service: General;  Laterality: N/A;  . COLONOSCOPY  2012  . DILATION AND CURETTAGE OF UTERUS    . ESOPHAGEAL MANOMETRY N/A 03/02/2015   Procedure: ESOPHAGEAL MANOMETRY (EM);  Surgeon: Willis Modena, MD;  Location: [facility];  Service: Endoscopy;  Laterality: N/A;  . ESOPHAGOGASTRODUODENOSCOPY (EGD) WITH PROPOFOL N/A 04/02/2014   Procedure: ESOPHAGOGASTRODUODENOSCOPY (EGD) WITH PROPOFOL;  Surgeon: Willis Modena, MD;  Location: [facility];  Service: Endoscopy;  Laterality: N/A;  . EUS N/A 04/02/2014   Procedure: ESOPHAGEAL ENDOSCOPIC ULTRASOUND (EUS) RADIAL;  Surgeon: Willis Modena, MD;  Location: [facility];  Service: Endoscopy;  Laterality: N/A;  . UPPER GASTROINTESTINAL ENDOSCOPY  2012    Family History  Problem Relation Age of Onset  . Diabetes type II Mother   . Hypertension Mother   . Diabetes Mother   . Hyperlipidemia Father   . Hypertension Father   . Anesthesia problems Neg Hx     Social History Social History  Substance Use Topics  . Smoking status: Never Smoker  . Smokeless tobacco: Never Used  . Alcohol use No    No Known Allergies  Current Outpatient Prescriptions  Medication Sig  Dispense Refill  . Prenatal Vit-Fe Phos-FA-Omega (VITAFOL GUMMIES) 3.33-0.333-34.8 MG CHEW   1  . acetaminophen (TYLENOL) 500 MG tablet Take 1,000 mg by mouth every 6 (six) hours as needed.     [name] gentian violet 2 % topical solution Apply 0.5 mLs topically 3 (three) times daily. For 3-4 days. (Patient not taking: Reported on 08/10/2016) 30 mL 0  . ibuprofen (ADVIL,MOTRIN) 600 MG tablet Take 1 tablet (600 mg total) by mouth every 6 (six) hours as needed for mild pain. (Patient not taking: Reported on 08/10/2016) 30 tablet 5  . Prenatal Vit-Fe Fumarate-FA (PRENATAL MULTIVITAMIN) TABS tablet Take 1 tablet by mouth daily at 12 noon.     No current facility-administered medications for this visit.     Review of Systems Review of Systems Constitutional: negative for fatigue and weight loss Respiratory: negative for cough and wheezing Cardiovascular: negative for chest pain, fatigue and palpitations Gastrointestinal: negative for abdominal pain and change in bowel habits Genitourinary:negative Integument/breast: negative for nipple discharge Musculoskeletal:negative for myalgias Neurological: negative for gait problems and tremors Behavioral/Psych: negative for abusive relationship, depression Endocrine: negative for temperature intolerance     Blood pressure 112/68, pulse 75, weight 132 lb (59.9 kg), last menstrual period 06/17/2016, currently breastfeeding.  Physical Exam Physical Exam General:   alert  Skin:   no rash or abnormalities  Lungs:   clear to auscultation bilaterally  Heart:   regular rate and  rhythm, S1, S2 normal, no murmur, click, rub or gallop  Breasts:   nipple tenderness to palpation.  No masses, discharge or erythema.  50% of 15 min visit spent on counseling and coordination of care.   Data Reviewed Breast ultrasound  Assessment     Mastodynia, left.  Negative evaluation and breast [facility] at [facility].    Plan    Referred to Lactation Consult by  telephone and recommendations and appointment made. F/U prn  No orders of the defined types were placed in this encounter.  No orders of the defined types were placed in this encounter.

## 2016-08-12 ENCOUNTER — Ambulatory Visit (HOSPITAL_COMMUNITY): Admission: RE | Admit: 2016-08-12 | Payer: Medicaid Other | Source: Ambulatory Visit

## 2017-07-04 ENCOUNTER — Other Ambulatory Visit: Payer: Self-pay | Admitting: Gastroenterology

## 2017-07-12 ENCOUNTER — Encounter (HOSPITAL_COMMUNITY): Admission: RE | Payer: Self-pay | Source: Ambulatory Visit

## 2017-07-12 ENCOUNTER — Ambulatory Visit (HOSPITAL_COMMUNITY): Admission: RE | Admit: 2017-07-12 | Payer: Medicaid Other | Source: Ambulatory Visit | Admitting: Gastroenterology

## 2017-07-12 SURGERY — MANOMETRY, ESOPHAGUS

## 2017-07-12 NOTE — Progress Notes (Signed)
Patient a "no show, no call"  for the esophageal manometry today.  Dr. Dulce Sellarutlaw made aware.  Attempted to call patient, no answer.  Omelia BlackwaterShelby Gionni Freese, RN

## 2017-09-15 ENCOUNTER — Encounter (HOSPITAL_COMMUNITY): Payer: Self-pay | Admitting: Emergency Medicine

## 2017-09-15 ENCOUNTER — Emergency Department (HOSPITAL_COMMUNITY)
Admission: EM | Admit: 2017-09-15 | Discharge: 2017-09-16 | Disposition: A | Discharge status: Home / Self Care | Payer: Medicaid Other | Attending: Emergency Medicine | Admitting: Emergency Medicine

## 2017-09-15 DIAGNOSIS — L509 Urticaria, unspecified: Secondary | ICD-10-CM | POA: Insufficient documentation

## 2017-09-15 DIAGNOSIS — R21 Rash and other nonspecific skin eruption: Secondary | ICD-10-CM | POA: Diagnosis present

## 2017-09-15 NOTE — ED Triage Notes (Signed)
Patient has rash all over body. Patient states she has not encountered anything new. Patient states this rash started this am. Patient states is itching. Patient took singulair last around 3pm today (09/15/2017).

## 2017-09-16 ENCOUNTER — Emergency Department (HOSPITAL_COMMUNITY)
Admission: EM | Admit: 2017-09-16 | Discharge: 2017-09-16 | Disposition: A | Discharge status: Home / Self Care | Payer: Medicaid Other | Attending: Emergency Medicine | Admitting: Emergency Medicine

## 2017-09-16 ENCOUNTER — Encounter (HOSPITAL_COMMUNITY): Payer: Self-pay | Admitting: Emergency Medicine

## 2017-09-16 DIAGNOSIS — L509 Urticaria, unspecified: Secondary | ICD-10-CM | POA: Diagnosis not present

## 2017-09-16 DIAGNOSIS — R21 Rash and other nonspecific skin eruption: Secondary | ICD-10-CM | POA: Diagnosis present

## 2017-09-16 MED ORDER — PREDNISONE 10 MG PO TABS: 40.0000 mg | ORAL_TABLET | Freq: Every day | ORAL | 0 refills | Status: AC

## 2017-09-16 MED ORDER — DIPHENHYDRAMINE HCL 25 MG PO CAPS
50.0000 mg | ORAL_CAPSULE | Freq: Once | ORAL | Status: AC
  Administered 2017-09-16: 50 mg via ORAL
  Filled 2017-09-16: qty 2

## 2017-09-16 MED ORDER — DEXAMETHASONE SODIUM PHOSPHATE 10 MG/ML IJ SOLN
10.0000 mg | Freq: Once | INTRAMUSCULAR | Status: AC
  Administered 2017-09-16: 10 mg via INTRAMUSCULAR
  Filled 2017-09-16: qty 1

## 2017-09-16 MED ORDER — FAMOTIDINE 20 MG PO TABS
20.0000 mg | ORAL_TABLET | Freq: Once | ORAL | Status: AC
  Administered 2017-09-16: 20 mg via ORAL
  Filled 2017-09-16: qty 1

## 2017-09-16 NOTE — ED Triage Notes (Signed)
Patient was seen last about 5 hours ago. It has spread to her legs. The rash did not go away.

## 2017-09-16 NOTE — ED Provider Notes (Signed)
[facility]ER[facility]ENC[facility] DEPT Provider Note   CSN: [id number] Arrival date & time: 09/16/17  0450     History   Chief Complaint Chief Complaint  Patient presents with  . Rash    HPI Julia [name] is a 31 y.o. female who presents today with chief complaint acute onset, progressively worsening urticarial rash. Rash began yesterday morning when she woke up. Rash is pruritic. She was seen and evaluated for this rash 5 hours ago, diagnosed with hives, given Benadryl with some improvement and sent home. She states that when she awoke 2 hours ago she notes that hives to her lower extremities had worsened and were more itchy. She also endorses mild shortness of breath which worsens with activity and throat tightness. She denies drooling, fevers, difficulty swallowing, CP, abdominal pain, nausea, or vomiting. She denies any new soaps, detergents, lotions, shampoos, medications. No at home has a similar rash. She does not spend time in heavily wooded areas and no history of recent insect bites.  The history is provided by the patient.    Past Medical History:  Diagnosis Date  . Anxiety   . Arthritis   . Back pain   . Chlamydia 2007  . Complication of anesthesia   . Depression   . Dyspepsia   . GERD (gastroesophageal reflux disease)   . Infection   . Kidney stones   . Migraine   . PONV (postoperative nausea and vomiting)   . Right sided weakness   . Sinusitis     Patient Active Problem List   Diagnosis Date Noted  . Breast abscess 07/08/2016  . NSVD (normal spontaneous vaginal delivery) 01/29/2016  . Encounter for trial of labor 01/28/2016  . Pelvic pain 01/21/2014  . Galactorrhea 04/08/2013  . Dysmenorrhea 04/08/2013  . Chronic cholecystitis 03/18/2013  . Unspecified constipation 01/27/2013  . Anemia 01/23/2013  . Pyelonephritis 01/22/2013    Past Surgical History:  Procedure Laterality Date  . CHOLECYSTECTOMY N/A 04/15/2013   Procedure: LAPAROSCOPIC CHOLECYSTECTOMY;   Surgeon: Shelly Rubenstein, MD;  Location: [facility] OR;  Service: General;  Laterality: N/A;  . COLONOSCOPY  2012  . DILATION AND CURETTAGE OF UTERUS    . ESOPHAGEAL MANOMETRY N/A 03/02/2015   Procedure: ESOPHAGEAL MANOMETRY (EM);  Surgeon: Willis Modena, MD;  Location: [facility];  Service: Endoscopy;  Laterality: N/A;  . ESOPHAGOGASTRODUODENOSCOPY (EGD) WITH PROPOFOL N/A 04/02/2014   Procedure: ESOPHAGOGASTRODUODENOSCOPY (EGD) WITH PROPOFOL;  Surgeon: Willis Modena, MD;  Location: [facility];  Service: Endoscopy;  Laterality: N/A;  . EUS N/A 04/02/2014   Procedure: ESOPHAGEAL ENDOSCOPIC ULTRASOUND (EUS) RADIAL;  Surgeon: Willis Modena, MD;  Location: [facility];  Service: Endoscopy;  Laterality: N/A;  . UPPER GASTROINTESTINAL ENDOSCOPY  2012    OB History    Gravida Para Term Preterm AB Living   0 1 3   SAB TAB Ectopic Multiple Live Births   1 0 0 0 3       Home Medications    Prior to Admission medications   Medication Sig Start Date End Date Taking? Authorizing Provider  acetaminophen (TYLENOL) 500 MG tablet Take 1,000 mg by mouth every 6 (six) hours as needed.     [provider]  gentian violet 2 % topical solution Apply 0.5 mLs topically 3 (three) times daily. For 3-4 days. Patient not taking: Reported on 08/10/2016 07/29/16   Orvilla Cornwall A, CNM  ibuprofen (ADVIL,MOTRIN) 600 MG tablet Take 1 tablet (600 mg total) by mouth every 6 (six) hours as  needed for mild pain. Patient not taking: Reported on 08/10/2016 01/31/16   Brock Bad, MD  predniSONE (DELTASONE) 10 MG tablet Take 4 tablets (40 mg total) by mouth daily. 09/16/17 09/19/17  Michela Pitcher A, PA-C  Prenatal Vit-Fe Fumarate-FA (PRENATAL MULTIVITAMIN) TABS tablet Take 1 tablet by mouth daily at 12 noon.    [provider]  Prenatal Vit-Fe Phos-FA-Omega (VITAFOL GUMMIES) 3.33-0.333-34.8 MG CHEW  07/19/16   [provider]    Family History Family History  Problem Relation Age of  Onset  . Diabetes type II Mother   . Hypertension Mother   . Diabetes Mother   . Hyperlipidemia Father   . Hypertension Father   . Anesthesia problems Neg Hx     Social History Social History  Substance Use Topics  . Smoking status: Never Smoker  . Smokeless tobacco: Never Used  . Alcohol use No     Allergies   Patient has no known allergies.   Review of Systems Review of Systems  Constitutional: Negative for chills and fever.  HENT: Negative for drooling, sore throat and trouble swallowing.   Respiratory: Positive for shortness of breath.   Cardiovascular: Negative for chest pain.  Gastrointestinal: Negative for nausea and vomiting.  Skin: Positive for rash.     Physical Exam Updated Vital Signs BP 126/74 (BP Location: Left Arm)   Pulse 88   Temp 98.4 F (36.9 C) (Oral)   Resp 12   Ht 5' (1.524 m)   Wt 63.5 kg (140 lb)   LMP 08/27/2017   SpO2 98%   BMI 27.34 kg/m   Physical Exam  Constitutional: She appears well-developed and well-nourished. No distress.  HENT:  Head: Normocephalic and atraumatic.  Right Ear: External ear normal.  Left Ear: External ear normal.  Mouth/Throat: Oropharynx is clear and moist.  No swelling of the lips or tongue. Posterior were things without tonsillar hypertrophy, erythema, uvular deviation, or exudates. No drooling noted. Airway is patent.  Eyes: Pupils are equal, round, and reactive to light. Conjunctivae and EOM are normal. Right eye exhibits no discharge. Left eye exhibits no discharge.  Neck: Normal range of motion. Neck supple. No JVD present. No tracheal deviation present.  Cardiovascular: Normal rate, regular rhythm and normal heart sounds.   Pulmonary/Chest: Effort normal and breath sounds normal. No stridor. No respiratory distress. She has no wheezes. She has no rales. She exhibits no tenderness.  Equal rise and fall of chest, no increased work of breathing  Abdominal: She exhibits no distension.  Musculoskeletal:  She exhibits no edema.  Lymphadenopathy:    She has no cervical adenopathy.  Neurological: She is alert.  Skin: Skin is warm and dry. Rash noted. No erythema.  Generalized urticarial rash to the abdomen, bilateral lower extremities, bilateral upper extremities, and back. Excoriations noted. Patient states hives to the lower extremities has improved. Rash spares the palms and soles. No fluctuance or induration noted. No pustules, vesicles, or desquamation noted.  Psychiatric: She has a normal mood and affect. Her behavior is normal.  Nursing note and vitals reviewed.    ED Treatments / Results  Labs (all labs ordered are listed, but only abnormal results are displayed) Labs Reviewed - No data to display  EKG  EKG Interpretation None       Radiology No results found.  Procedures Procedures (including critical care time)  Medications Ordered in ED Medications  dexamethasone (DECADRON) injection 10 mg (10 mg Intramuscular Given 09/16/17 0609)  famotidine (PEPCID) tablet  20 mg (20 mg Oral Given 09/16/17 0609)     Initial Impression / Assessment and Plan / ED Course  I have reviewed the triage vital signs and the nursing notes.  Pertinent labs & imaging results that were available during my care of the patient were reviewed by me and considered in my medical decision making (see chart for details).     Rash consistent with hives. Afebrile, VSS, SPO2 100% on RA. Pt has a patent airway without stridor and is handling secretions without difficulty; no angioedema or facial swelling. No blisters, no pustules, no warmth, no draining sinus tracts, no superficial abscesses, no bullous impetigo, no vesicles, no desquamation, no target lesions with dusky purpura or a central bulla. Not tender to touch. No concern for superimposed infection. No concern for SJS, TEN, TSS, tick borne illness, syphilis or other life-threatening condition. She was given Decadron and Pepcid while in the ED with  improvement on reevaluation. Will discharge home with short course of steroids, pepcid and recommend Benadryl as needed for pruritis. Discussed indications for return to the ED. Recommend follow-up with primary care physician or allergist for reevaluation if symptoms persist. Pt verbalized understanding of and agreement with plan and is safe for discharge home at this time.  Final Clinical Impressions(s) / ED Diagnoses   Final diagnoses:  Hives    New Prescriptions Discharge Medication List as of 09/16/2017  6:25 AM    START taking these medications   Details  predniSONE (DELTASONE) 10 MG tablet Take 4 tablets (40 mg total) by mouth daily., Starting Sat 09/16/2017, Until Tue 09/19/2017, Print         Amyra Vantuyl, [place] A, PA-C 09/16/17 1610    Pricilla Loveless, MD 09/16/17 [phone]

## 2017-09-16 NOTE — Discharge Instructions (Signed)
Take prednisone as prescribed for the next 3 days starting tomorrow. You were given your first dose today in the emergency department. Take Benadryl 25 mg every 6 hours as needed for itching. Take Pepcid 20 mg twice daily for itching. Follow-up with primary care physician if rash persists. Return to the ED immediately if any concerning signs or symptoms develop such as throat tightness, drooling, shortness of breath, or fevers.

## 2017-09-16 NOTE — ED Provider Notes (Signed)
[facility]-EMERGENCY DEPT Provider Note   CSN: [id number] Arrival date & time: 09/15/17  2257     History   Chief Complaint Chief Complaint  Patient presents with  . Rash    HPI Julia [name] is a 31 y.o. female.  HPI  31 year old female presents with diffuse rash. Started this morning when she first woke up. It is very itchy. She has tried cetirizine with minimal relief. She's not sure what is causing the rash. There have been no new foods, pets, medicines, or detergent/closed. Has never had an allergic reaction like this before. No shortness of breath or throat closing sensation. It is scattered, mostly over her chest, abdomen, groin, and proximal extremities. Is also on her back.  Past Medical History:  Diagnosis Date  . Anxiety   . Arthritis   . Back pain   . Chlamydia 2007  . Complication of anesthesia   . Depression   . Dyspepsia   . GERD (gastroesophageal reflux disease)   . Infection   . Kidney stones   . Migraine   . PONV (postoperative nausea and vomiting)   . Right sided weakness   . Sinusitis     Patient Active Problem List   Diagnosis Date Noted  . Breast abscess 07/08/2016  . NSVD (normal spontaneous vaginal delivery) 01/29/2016  . Encounter for trial of labor 01/28/2016  . Pelvic pain 01/21/2014  . Galactorrhea 04/08/2013  . Dysmenorrhea 04/08/2013  . Chronic cholecystitis 03/18/2013  . Unspecified constipation 01/27/2013  . Anemia 01/23/2013  . Pyelonephritis 01/22/2013    Past Surgical History:  Procedure Laterality Date  . CHOLECYSTECTOMY N/A 04/15/2013   Procedure: LAPAROSCOPIC CHOLECYSTECTOMY;  Surgeon: Shelly Rubenstein, MD;  Location: [facility] OR;  Service: General;  Laterality: N/A;  . COLONOSCOPY  2012  . DILATION AND CURETTAGE OF UTERUS    . ESOPHAGEAL MANOMETRY N/A 03/02/2015   Procedure: ESOPHAGEAL MANOMETRY (EM);  Surgeon: Willis Modena, MD;  Location: [facility];  Service: Endoscopy;  Laterality: N/A;  . ESOPHAGOGASTRODUODENOSCOPY  (EGD) WITH PROPOFOL N/A 04/02/2014   Procedure: ESOPHAGOGASTRODUODENOSCOPY (EGD) WITH PROPOFOL;  Surgeon: Willis Modena, MD;  Location: [facility];  Service: Endoscopy;  Laterality: N/A;  . EUS N/A 04/02/2014   Procedure: ESOPHAGEAL ENDOSCOPIC ULTRASOUND (EUS) RADIAL;  Surgeon: Willis Modena, MD;  Location: [facility];  Service: Endoscopy;  Laterality: N/A;  . UPPER GASTROINTESTINAL ENDOSCOPY  2012    OB History    Gravida Para Term Preterm AB Living   0 1 3   SAB TAB Ectopic Multiple Live Births   1 0 0 0 3       Home Medications    Prior to Admission medications   Medication Sig Start Date End Date Taking? Authorizing Provider  acetaminophen (TYLENOL) 500 MG tablet Take 1,000 mg by mouth every 6 (six) hours as needed.     [provider]  gentian violet 2 % topical solution Apply 0.5 mLs topically 3 (three) times daily. For 3-4 days. Patient not taking: Reported on 08/10/2016 07/29/16   Orvilla Cornwall A, CNM  ibuprofen (ADVIL,MOTRIN) 600 MG tablet Take 1 tablet (600 mg total) by mouth every 6 (six) hours as needed for mild pain. Patient not taking: Reported on 08/10/2016 01/31/16   Brock Bad, MD  Prenatal Vit-Fe Fumarate-FA (PRENATAL MULTIVITAMIN) TABS tablet Take 1 tablet by mouth daily at 12 noon.    [provider]  Prenatal Vit-Fe Phos-FA-Omega (VITAFOL GUMMIES) 3.33-0.333-34.8 MG CHEW  07/19/16   [provider]  Family History Family History  Problem Relation Age of Onset  . Diabetes type II Mother   . Hypertension Mother   . Diabetes Mother   . Hyperlipidemia Father   . Hypertension Father   . Anesthesia problems Neg Hx     Social History Social History  Substance Use Topics  . Smoking status: Never Smoker  . Smokeless tobacco: Never Used  . Alcohol use No     Allergies   Patient has no known allergies.   Review of Systems Review of Systems  HENT: Negative for sore throat and voice change.   Respiratory:  Negative for shortness of breath.   Gastrointestinal: Negative for vomiting.  Skin: Positive for rash.  All other systems reviewed and are negative.    Physical Exam Updated Vital Signs BP 128/77 (BP Location: Right Arm)   Pulse 89   Temp 98.3 F (36.8 C) (Oral)   Resp 18   Ht 5' (1.524 m)   Wt 63.5 kg (140 lb)   LMP 08/27/2017   SpO2 100%   BMI 27.34 kg/m   Physical Exam  Constitutional: She is oriented to person, place, and time. She appears well-developed and well-nourished.  HENT:  Head: Normocephalic and atraumatic.  Right Ear: External ear normal.  Left Ear: External ear normal.  Nose: Nose normal.  Mouth/Throat: Oropharynx is clear and moist. No oropharyngeal exudate.  Eyes: Right eye exhibits no discharge. Left eye exhibits no discharge.  Neck: Neck supple.  Cardiovascular: Normal rate, regular rhythm and normal heart sounds.   Pulmonary/Chest: Effort normal and breath sounds normal. No stridor. She has no wheezes.  Abdominal: Soft. There is no tenderness.  Neurological: She is alert and oriented to person, place, and time.  Skin: Skin is warm and dry. Rash noted.  Scattered hives that have coalesced together, mostly anterior upper chest, lower abdomen, mid-back, and proximal all 4 extremities. No oral/facial rash  Nursing note and vitals reviewed.    ED Treatments / Results  Labs (all labs ordered are listed, but only abnormal results are displayed) Labs Reviewed - No data to display  EKG  EKG Interpretation None       Radiology No results found.  Procedures Procedures (including critical care time)  Medications Ordered in ED Medications  diphenhydrAMINE (BENADRYL) capsule 50 mg (not administered)     Initial Impression / Assessment and Plan / ED Course  I have reviewed the triage vital signs and the nursing notes.  Pertinent labs & imaging results that were available during my care of the patient were reviewed by me and considered in my  medical decision making (see chart for details).     Patient with hives of unclear etiology. No signs or symptoms of anaphylaxis or more severe disease. She will be given Benadryl and instructed to use this every 4-6 hours as needed. Discussed possible side effects and not to drive with this medicine. Discussed return precautions.  Final Clinical Impressions(s) / ED Diagnoses   Final diagnoses:  Hives    New Prescriptions New Prescriptions   No medications on file     Pricilla Loveless, MD 09/16/17 [phone]

## 2018-10-09 ENCOUNTER — Inpatient Hospital Stay (HOSPITAL_COMMUNITY)
Admission: AD | Admit: 2018-10-09 | Discharge: 2018-10-09 | Disposition: A | Discharge status: Home / Self Care | Payer: Medicaid Other | Source: Ambulatory Visit | Attending: Obstetrics & Gynecology | Admitting: Obstetrics & Gynecology

## 2018-10-09 ENCOUNTER — Encounter (HOSPITAL_COMMUNITY): Payer: Self-pay | Admitting: *Deleted

## 2018-10-09 ENCOUNTER — Inpatient Hospital Stay (HOSPITAL_COMMUNITY): Payer: Medicaid Other

## 2018-10-09 DIAGNOSIS — O26891 Other specified pregnancy related conditions, first trimester: Secondary | ICD-10-CM | POA: Diagnosis present

## 2018-10-09 DIAGNOSIS — Z3A01 Less than 8 weeks gestation of pregnancy: Secondary | ICD-10-CM | POA: Diagnosis not present

## 2018-10-09 DIAGNOSIS — O26899 Other specified pregnancy related conditions, unspecified trimester: Secondary | ICD-10-CM

## 2018-10-09 DIAGNOSIS — O3680X0 Pregnancy with inconclusive fetal viability, not applicable or unspecified: Secondary | ICD-10-CM | POA: Diagnosis not present

## 2018-10-09 DIAGNOSIS — R1030 Lower abdominal pain, unspecified: Secondary | ICD-10-CM | POA: Insufficient documentation

## 2018-10-09 DIAGNOSIS — O219 Vomiting of pregnancy, unspecified: Secondary | ICD-10-CM

## 2018-10-09 DIAGNOSIS — R109 Unspecified abdominal pain: Secondary | ICD-10-CM | POA: Diagnosis not present

## 2018-10-09 DIAGNOSIS — R112 Nausea with vomiting, unspecified: Secondary | ICD-10-CM | POA: Insufficient documentation

## 2018-10-09 LAB — WET PREP, GENITAL
CLUE CELLS WET PREP: NONE SEEN
SPERM: NONE SEEN
TRICH WET PREP: NONE SEEN
Yeast Wet Prep HPF POC: NONE SEEN

## 2018-10-09 LAB — URINALYSIS, ROUTINE W REFLEX MICROSCOPIC
BILIRUBIN URINE: NEGATIVE
Glucose, UA: NEGATIVE mg/dL
Hgb urine dipstick: NEGATIVE
KETONES UR: NEGATIVE mg/dL
NITRITE: NEGATIVE
PH: 6 (ref 5.0–8.0)
Protein, ur: NEGATIVE mg/dL
SPECIFIC GRAVITY, URINE: 1.024 (ref 1.005–1.030)

## 2018-10-09 LAB — CBC
HEMATOCRIT: 33.9 % — AB (ref 36.0–46.0)
Hemoglobin: 11.2 g/dL — ABNORMAL LOW (ref 12.0–15.0)
MCH: 26.9 pg (ref 26.0–34.0)
MCHC: 33 g/dL (ref 30.0–36.0)
MCV: 81.5 fL (ref 80.0–100.0)
PLATELETS: 260 10*3/uL (ref 150–400)
RBC: 4.16 MIL/uL (ref 3.87–5.11)
RDW: 14.2 % (ref 11.5–15.5)
WBC: 6.8 10*3/uL (ref 4.0–10.5)
nRBC: 0 % (ref 0.0–0.2)

## 2018-10-09 LAB — HCG, QUANTITATIVE, PREGNANCY: hCG, Beta Chain, Quant, S: 56049 m[IU]/mL — ABNORMAL HIGH (ref <5)

## 2018-10-09 LAB — POCT PREGNANCY, URINE: Preg Test, Ur: POSITIVE — AB

## 2018-10-09 MED ORDER — DOXYLAMINE-PYRIDOXINE 10-10 MG PO TBEC: 1.0000 | DELAYED_RELEASE_TABLET | Freq: Every evening | ORAL | 2 refills | Status: DC | PRN

## 2018-10-09 NOTE — MAU Provider Note (Signed)
Chief Complaint: Abdominal Pain   First Provider Initiated Contact with Patient 10/09/18 2201        SUBJECTIVE HPI: Julia [name] is a 32 y.o. [id number] at [redacted]w[redacted]d by LMP who presents to maternity admissions reporting sharp pain in lower abdomen for the past 2 days, comes and goes.  States had pregnancy confirmation in [facility] office.  She denies vaginal bleeding, vaginal itching/burning, urinary symptoms, h/a, dizziness, n/v, or fever/chills.    Abdominal Pain  This is a new problem. The current episode started in the past 7 days. The onset quality is gradual. The problem occurs intermittently. The problem has been waxing and waning. The pain is located in the LLQ and RLQ. The pain is mild. The quality of the pain is cramping. The abdominal pain does not radiate. Pertinent negatives include no constipation, diarrhea, dysuria, fever or frequency. Nothing aggravates the pain. The pain is relieved by nothing. She has tried nothing for the symptoms.   RN Note: Pt had pregnancy confirmed in [employer]. C/o  Lower Sharp abd pain on and off for the past 2 days . Denies any vaginal bleeding but reprints a yellow vaginal discharge  Past Medical History:  Diagnosis Date  . Anxiety   . Arthritis   . Back pain   . Chlamydia 2007  . Complication of anesthesia   . Depression   . Dyspepsia   . GERD (gastroesophageal reflux disease)   . Infection   . Kidney stones   . Migraine   . PONV (postoperative nausea and vomiting)   . Right sided weakness   . Sinusitis    Past Surgical History:  Procedure Laterality Date  . CHOLECYSTECTOMY N/A 04/15/2013   Procedure: LAPAROSCOPIC CHOLECYSTECTOMY;  Surgeon: Shelly Rubenstein, MD;  Location: [facility] OR;  Service: General;  Laterality: N/A;  . COLONOSCOPY  2012  . DILATION AND CURETTAGE OF UTERUS    . ESOPHAGEAL MANOMETRY N/A 03/02/2015   Procedure: ESOPHAGEAL MANOMETRY (EM);  Surgeon: Willis Modena, MD;  Location[facility];  Service: Endoscopy;   Laterality: N/A;  . ESOPHAGOGASTRODUODENOSCOPY (EGD) WITH PROPOFOL N/A 04/02/2014   Procedure: ESOPHAGOGASTRODUODENOSCOPY (EGD) WITH PROPOFOL;  Surgeon: Willis Modena, MD;  Location[facility];  Service: Endoscopy;  Laterality: N/A;  . EUS N/A 04/02/2014   Procedure: ESOPHAGEAL ENDOSCOPIC ULTRASOUND (EUS) RADIAL;  Surgeon: Willis Modena, MD;  Location[facility];  Service: Endoscopy;  Laterality: N/A;  . UPPER GASTROINTESTINAL ENDOSCOPY  2012   Social History   Socioeconomic History  . Marital status: Married    Spouse name: Trinna Post  . Number of children: 2  . Years of education: 64  . Highest education level: Not on file  Occupational History    Employer: NOT EMPLOYED    Comment: not employed  Social Needs  . Financial resource strain: Not on file  . Food insecurity:    Worry: Not on file    Inability: Not on file  . Transportation needs:    Medical: Not on file    Non-medical: Not on file  Tobacco Use  . Smoking status: Never Smoker  . Smokeless tobacco: Never Used  Substance and Sexual Activity  . Alcohol use: No    Alcohol/week: 0.0 standard drinks  . Drug use: No  . Sexual activity: Not Currently    Partners: Male    Birth control/protection: None  Lifestyle  . Physical activity:    Days per week: Not on file    Minutes per session: Not on file  .  Stress: Not on file  Relationships  . Social connections:    Talks on phone: Not on file    Gets together: Not on file    Attends religious service: Not on file    Active member of club or organization: Not on file    Attends meetings of clubs or organizations: Not on file    Relationship status: Not on file  . Intimate partner violence:    Fear of current or ex partner: Not on file    Emotionally abused: Not on file    Physically abused: Not on file    Forced sexual activity: Not on file  Other Topics Concern  . Not on file  Social History Narrative   Patient is right handed , consumes one soda daily   No  current facility-administered medications on file prior to encounter.    Current Outpatient Medications on File Prior to Encounter  Medication Sig Dispense Refill  . acetaminophen (TYLENOL) 500 MG tablet Take 1,000 mg by mouth every 6 (six) hours as needed.     [name] gentian violet 2 % topical solution Apply 0.5 mLs topically 3 (three) times daily. For 3-4 days. (Patient not taking: Reported on 08/10/2016) 30 mL 0  . ibuprofen (ADVIL,MOTRIN) 600 MG tablet Take 1 tablet (600 mg total) by mouth every 6 (six) hours as needed for mild pain. (Patient not taking: Reported on 08/10/2016) 30 tablet 5  . Prenatal Vit-Fe Fumarate-FA (PRENATAL MULTIVITAMIN) TABS tablet Take 1 tablet by mouth daily at 12 noon.    . Prenatal Vit-Fe Phos-FA-Omega (VITAFOL GUMMIES) 3.33-0.333-34.8 MG CHEW   1   No Known Allergies  I have reviewed patient's Past Medical Hx, Surgical Hx, Family Hx, Social Hx, medications and allergies.   ROS:  Review of Systems  Constitutional: Negative for fever.  Gastrointestinal: Positive for abdominal pain. Negative for constipation and diarrhea.  Genitourinary: Negative for dysuria, frequency, vaginal bleeding and vaginal discharge.   Review of Systems  Other systems negative   Physical Exam  Physical Exam Patient Vitals for the past 24 hrs:  BP Temp Temp src Pulse Resp Height Weight  10/09/18 1823 113/65 98.4 F (36.9 C) Oral 83 18 5' (1.524 m) 68.5 kg   Constitutional: Well-developed, well-nourished female in no acute distress.  Cardiovascular: normal rate Respiratory: normal effort GI: Abd soft, non-tender. Pos BS x 4 MS: Extremities nontender, no edema, normal ROM Neurologic: Alert and oriented x 4.  GU: Neg CVAT.  PELVIC EXAM: Cervix pink, visually closed, without lesion, scant white creamy discharge, vaginal walls and external genitalia normal Bimanual exam: Cervix 0/long/high, firm, anterior, neg CMT, uterus nontender, slightly enlarged, adnexa without tenderness,  enlargement, or mass  LAB RESULTS Results for orders placed or performed during the hospital encounter of 10/09/18 (from the past 24 hour(s))  Urinalysis, Routine w reflex microscopic     Status: Abnormal   Collection Time: 10/09/18  6:53 PM  Result Value Ref Range   Color, Urine YELLOW YELLOW   APPearance HAZY (A) CLEAR   Specific Gravity, Urine 1.024 1.005 - 1.030   pH 6.0 5.0 - 8.0   Glucose, UA NEGATIVE NEGATIVE mg/dL   Hgb urine dipstick NEGATIVE NEGATIVE   Bilirubin Urine NEGATIVE NEGATIVE   Ketones, ur NEGATIVE NEGATIVE mg/dL   Protein, ur NEGATIVE NEGATIVE mg/dL   Nitrite NEGATIVE NEGATIVE   Leukocytes, UA TRACE (A) NEGATIVE   RBC / HPF 0-5 0 - 5 RBC/hpf   WBC, UA 0-5 0 - 5 WBC/hpf  Bacteria, UA RARE (A) NONE SEEN   Squamous Epithelial / LPF 0-5 0 - 5   Mucus PRESENT   Pregnancy, urine POC     Status: Abnormal   Collection Time: 10/09/18  6:55 PM  Result Value Ref Range   Preg Test, Ur POSITIVE (A) NEGATIVE  CBC     Status: Abnormal   Collection Time: 10/09/18  7:00 PM  Result Value Ref Range   WBC 6.8 4.0 - 10.5 K/uL   RBC 4.16 3.87 - 5.11 MIL/uL   Hemoglobin 11.2 (L) 12.0 - 15.0 g/dL   HCT 16.1 (L) 09.6 - 04.5 %   MCV 81.5 80.0 - 100.0 fL   MCH 26.9 26.0 - 34.0 pg   MCHC 33.0 30.0 - 36.0 g/dL   RDW 40.9 81.1 - 91.4 %   Platelets 260 150 - 400 K/uL   nRBC 0.0 0.0 - 0.2 %  hCG, quantitative, pregnancy     Status: Abnormal   Collection Time: 10/09/18  7:00 PM  Result Value Ref Range   hCG, Beta Chain, Quant, S 56,049 (H) <5 mIU/mL  Wet prep, genital     Status: Abnormal   Collection Time: 10/09/18  9:09 PM  Result Value Ref Range   Yeast Wet Prep HPF POC NONE SEEN NONE SEEN   Trich, Wet Prep NONE SEEN NONE SEEN   Clue Cells Wet Prep HPF POC NONE SEEN NONE SEEN   WBC, Wet Prep HPF POC MODERATE (A) NONE SEEN   Sperm NONE SEEN      IMAGING US Ob Less Than 14 Weeks With Ob Transvaginal  Result Date: 10/09/2018 CLINICAL DATA:  Abdominal pain. Gestational  age by last menstrual period 7 weeks and 1 day. EXAM: OBSTETRIC <14 WK [place] AND TRANSVAGINAL OB US TECHNIQUE: Both transabdominal and transvaginal ultrasound examinations were performed for complete evaluation of the gestation as well as the maternal uterus, adnexal regions, and pelvic cul-de-sac. Transvaginal technique was performed to assess early pregnancy. COMPARISON:  None. FINDINGS: Intrauterine gestational sac: Present. Yolk sac:  Present. Embryo:  Present. Cardiac Activity: Present. Heart Rate: 120 bpm CRL:  7 mm   6 w   3 d                  [place] EDC: June 01, 2019 Subchorionic hemorrhage:  None visualized. Maternal uterus/adnexae: 2.7 cm normal appearing paraovarian simple cyst, additional small RIGHT corpus luteal cyst. Trace free fluid in the pelvis. IMPRESSION: 1. Single live intrauterine pregnancy, gestational age by ultrasound 6 weeks and 3 days. No immediate complication. Electronically Signed   By: Awilda Metro M.D.   On: 10/09/2018 20:17    MAU Management/MDM: Ordered usual first trimester r/o ectopic labs.   Pelvic exam and cultures done Will check baseline Ultrasound to rule out ectopic.  This bleeding/pain can represent a normal pregnancy with bleeding, spontaneous abortion or even an ectopic which can be life-threatening.  The process as listed above helps to determine which of these is present.  Discussed findings. Patient is very happy to see everything is well. States has some nausea also.  Would like Rx for Diclegis   Encouraged to seek prenatal care soon.   ASSESSMENT 1. Nausea and vomiting during pregnancy   2. Abdominal pain affecting pregnancy   3.     Pregnancy of unknown location, now confirmed IUP  PLAN Discharge home Rx Diclegis for nausea Encouraged to seek prenatal care soon  Follow-up Information    Department, [facility]. Schedule  an appointment as soon as possible for a visit.   Contact information: [address] [place]  [address] [phone]          Pt stable at time of discharge. Encouraged to return here or to other Urgent Care/ED if she develops worsening of symptoms, increase in pain, fever, or other concerning symptoms.    Wynelle Bourgeois CNM, MSN Certified Nurse-Midwife 10/09/2018  10:11 PM

## 2018-10-09 NOTE — Discharge Instructions (Signed)
First Trimester of Pregnancy The first trimester of pregnancy is from week 1 until the end of week 13 (months 1 through 3). During this time, your baby will begin to develop inside you. At 6-8 weeks, the eyes and face are formed, and the heartbeat can be seen on ultrasound. At the end of 12 weeks, all the baby's organs are formed. Prenatal care is all the medical care you receive before the birth of your baby. Make sure you get good prenatal care and follow all of your doctor's instructions. Follow these instructions at home: Medicines  Take over-the-counter and prescription medicines only as told by your doctor. Some medicines are safe and some medicines are not safe during pregnancy.  Take a prenatal vitamin that contains at least 600 micrograms (mcg) of folic acid.  If you have trouble pooping (constipation), take medicine that will make your stool soft (stool softener) if your doctor approves. Eating and drinking  Eat regular, healthy meals.  Your doctor will tell you the amount of weight gain that is right for you.  Avoid raw meat and uncooked cheese.  If you feel sick to your stomach (nauseous) or throw up (vomit): ? Eat 4 or 5 small meals a day instead of 3 large meals. ? Try eating a few soda crackers. ? Drink liquids between meals instead of during meals.  To prevent constipation: ? Eat foods that are high in fiber, like fresh fruits and vegetables, whole grains, and beans. ? Drink enough fluids to keep your pee (urine) clear or pale yellow. Activity  Exercise only as told by your doctor. Stop exercising if you have cramps or pain in your lower belly (abdomen) or low back.  Do not exercise if it is too hot, too humid, or if you are in a place of great height (high altitude).  Try to avoid standing for long periods of time. Move your legs often if you must stand in one place for a long time.  Avoid heavy lifting.  Wear low-heeled shoes. Sit and stand up straight.  You  can have sex unless your doctor tells you not to. Relieving pain and discomfort  Wear a good support bra if your breasts are sore.  Take warm water baths (sitz baths) to soothe pain or discomfort caused by hemorrhoids. Use hemorrhoid cream if your doctor says it is okay.  Rest with your legs raised if you have leg cramps or low back pain.  If you have puffy, bulging veins (varicose veins) in your legs: ? Wear support hose or compression stockings as told by your doctor. ? Raise (elevate) your feet for 15 minutes, 3-4 times a day. ? Limit salt in your food. Prenatal care  Schedule your prenatal visits by the twelfth week of pregnancy.  Write down your questions. Take them to your prenatal visits.  Keep all your prenatal visits as told by your doctor. This is important. Safety  Wear your seat belt at all times when driving.  Make a list of emergency phone numbers. The list should include numbers for family, friends, the hospital, and police and fire departments. General instructions  Ask your doctor for a referral to a local prenatal class. Begin classes no later than at the start of month 6 of your pregnancy.  Ask for help if you need counseling or if you need help with nutrition. Your doctor can give you advice or tell you where to go for help.  Do not use hot tubs, steam rooms, or   saunas.  Do not douche or use tampons or scented sanitary pads.  Do not cross your legs for long periods of time.  Avoid all herbs and alcohol. Avoid drugs that are not approved by your doctor.  Do not use any tobacco products, including cigarettes, chewing tobacco, and electronic cigarettes. If you need help quitting, ask your doctor. You may get counseling or other support to help you quit.  Avoid cat litter boxes and soil used by cats. These carry germs that can cause birth defects in the baby and can cause a loss of your baby (miscarriage) or stillbirth.  Visit your dentist. At home, brush  your teeth with a soft toothbrush. Be gentle when you floss. Contact a doctor if:  You are dizzy.  You have mild cramps or pressure in your lower belly.  You have a nagging pain in your belly area.  You continue to feel sick to your stomach, you throw up, or you have watery poop (diarrhea).  You have a bad smelling fluid coming from your vagina.  You have pain when you pee (urinate).  You have increased puffiness (swelling) in your face, hands, legs, or ankles. Get help right away if:  You have a fever.  You are leaking fluid from your vagina.  You have spotting or bleeding from your vagina.  You have very bad belly cramping or pain.  You gain or lose weight rapidly.  You throw up blood. It may look like coffee grounds.  You are around people who have German measles, fifth disease, or chickenpox.  You have a very bad headache.  You have shortness of breath.  You have any kind of trauma, such as from a fall or a car accident. Summary  The first trimester of pregnancy is from week 1 until the end of week 13 (months 1 through 3).  To take care of yourself and your unborn baby, you will need to eat healthy meals, take medicines only if your doctor tells you to do so, and do activities that are safe for you and your baby.  Keep all follow-up visits as told by your doctor. This is important as your doctor will have to ensure that your baby is healthy and growing well. This information is not intended to replace advice given to you by your health care provider. Make sure you discuss any questions you have with your health care provider. Document Released: 05/16/2008 Document Revised: 12/06/2016 Document Reviewed: 12/06/2016 Elsevier Interactive Patient Education  2017 [employer]vier Inc.  

## 2018-10-09 NOTE — MAU Note (Signed)
Pt had pregnancy confirmed in [facility]. C/o  Lower Sharp abd pain on and off for the past 2 days . Denies any vaginal bleeding but reprints a yellow vaginal discharge

## 2018-10-10 LAB — GC/CHLAMYDIA PROBE AMP (~~LOC~~) NOT AT ARMC
Chlamydia: NEGATIVE
NEISSERIA GONORRHEA: NEGATIVE

## 2018-10-24 ENCOUNTER — Other Ambulatory Visit: Payer: Self-pay

## 2018-10-24 ENCOUNTER — Inpatient Hospital Stay (HOSPITAL_COMMUNITY)
Admission: AD | Admit: 2018-10-24 | Discharge: 2018-10-24 | Discharge status: Against Medical Advice | Payer: Medicaid Other | Source: Ambulatory Visit | Attending: Obstetrics & Gynecology | Admitting: Obstetrics & Gynecology

## 2018-10-24 DIAGNOSIS — Z5321 Procedure and treatment not carried out due to patient leaving prior to being seen by health care provider: Secondary | ICD-10-CM | POA: Diagnosis not present

## 2018-10-24 DIAGNOSIS — R109 Unspecified abdominal pain: Secondary | ICD-10-CM | POA: Diagnosis not present

## 2018-10-24 DIAGNOSIS — O26899 Other specified pregnancy related conditions, unspecified trimester: Secondary | ICD-10-CM

## 2018-10-24 LAB — URINALYSIS, ROUTINE W REFLEX MICROSCOPIC
BILIRUBIN URINE: NEGATIVE
Glucose, UA: NEGATIVE mg/dL
HGB URINE DIPSTICK: NEGATIVE
Ketones, ur: NEGATIVE mg/dL
NITRITE: NEGATIVE
PROTEIN: NEGATIVE mg/dL
SPECIFIC GRAVITY, URINE: 1.025 (ref 1.005–1.030)
pH: 5 (ref 5.0–8.0)

## 2018-10-24 NOTE — MAU Note (Signed)
Pt presents to MAU with complaints of lower abdominal cramping since 830 this morning. Denies any VB

## 2018-10-24 NOTE — MAU Note (Signed)
Called from lobby, no answer X 2  

## 2018-10-24 NOTE — MAU Note (Signed)
Called from lobby, no answer 

## 2018-10-24 NOTE — MAU Note (Signed)
Called from lobby, no answer X3

## 2018-11-12 LAB — OB RESULTS CONSOLE HEPATITIS B SURFACE ANTIGEN
Hepatitis B Surface Ag: NEGATIVE
Hepatitis B Surface Ag: NEGATIVE
Hepatitis B Surface Ag: NEGATIVE

## 2018-11-12 LAB — OB RESULTS CONSOLE GC/CHLAMYDIA
Chlamydia: NEGATIVE
Gonorrhea: NEGATIVE
Gonorrhea: NEGATIVE
Gonorrhea: NEGATIVE

## 2018-11-12 LAB — OB RESULTS CONSOLE RUBELLA ANTIBODY, IGM
Rubella: IMMUNE
Rubella: IMMUNE
Rubella: IMMUNE

## 2018-11-12 LAB — OB RESULTS CONSOLE ANTIBODY SCREEN: Antibody Screen: NEGATIVE

## 2018-11-12 LAB — OB RESULTS CONSOLE HIV ANTIBODY (ROUTINE TESTING)
HIV: NONREACTIVE
HIV: NONREACTIVE
HIV: NONREACTIVE

## 2018-11-12 LAB — OB RESULTS CONSOLE ABO/RH: RH Type: POSITIVE

## 2018-11-12 LAB — OB RESULTS CONSOLE RPR: RPR: NONREACTIVE

## 2018-12-12 NOTE — L&D Delivery Note (Signed)
Delivery Note Pt reported increased vaginal pressure. On exam, swelling noted to be significantly decreased and thin rim easily reduced with one push. Pt proceeded to push for 2-3 attempts and at 11:58 AM a viable female was delivered via Vaginal, Spontaneous (Presentation: LOA;  ).  APGAR: 9,9 ; weight 6lbs 4oz.   Placenta status:delivered intact with trailing membranes ; uterus manually palpated and no residual tissue/membranes noted , .  Cord: 3vc  with the following complications: none.  Cord pH: n/a  Anesthesia: Epidural  Episiotomy: None Lacerations: None Est. Blood Loss (mL): 250  Mom to postpartum.  Baby to Couplet care / Skin to Skin  Pt does not desire circumcision for baby.  Cathrine Muster 05/08/2019, 12:19 PM

## 2019-04-23 ENCOUNTER — Encounter: Payer: Medicaid Other | Attending: Obstetrics and Gynecology | Admitting: Registered"

## 2019-04-23 ENCOUNTER — Telehealth (HOSPITAL_COMMUNITY): Payer: Self-pay | Admitting: *Deleted

## 2019-04-23 ENCOUNTER — Encounter (HOSPITAL_COMMUNITY): Payer: Self-pay | Admitting: *Deleted

## 2019-04-23 ENCOUNTER — Encounter: Payer: Self-pay | Admitting: Registered"

## 2019-04-23 ENCOUNTER — Other Ambulatory Visit: Payer: Self-pay

## 2019-04-23 DIAGNOSIS — O99810 Abnormal glucose complicating pregnancy: Secondary | ICD-10-CM | POA: Insufficient documentation

## 2019-04-23 NOTE — Telephone Encounter (Signed)
Preadmission screen  

## 2019-04-23 NOTE — Progress Notes (Signed)
Patient was seen on 04/23/2019 for Gestational Diabetes self-management education at the [facility]. The following learning objectives were met by the patient during this course:   States the definition of Gestational Diabetes  States why dietary management is important in controlling blood glucose  Describes the effects each nutrient has on blood glucose levels  Demonstrates ability to create a balanced meal plan  Demonstrates carbohydrate counting   States when to check blood glucose levels  Demonstrates proper blood glucose monitoring techniques  States the effect of stress and exercise on blood glucose levels  States the importance of limiting caffeine and abstaining from alcohol and smoking  Blood glucose monitor given: none  Patient instructed to monitor glucose levels: FBS: 60 - <95; 1 hour: <140; 2 hour: <120  Patient received handouts:  Nutrition Diabetes and Pregnancy, including carb counting list  Patient will be seen for follow-up as needed.

## 2019-04-24 ENCOUNTER — Telehealth (HOSPITAL_COMMUNITY): Payer: Self-pay | Admitting: *Deleted

## 2019-04-24 ENCOUNTER — Encounter (HOSPITAL_COMMUNITY): Payer: Self-pay | Admitting: *Deleted

## 2019-04-24 NOTE — Telephone Encounter (Signed)
Preadmission screen  

## 2019-04-30 ENCOUNTER — Other Ambulatory Visit: Payer: Self-pay | Admitting: Obstetrics and Gynecology

## 2019-05-03 ENCOUNTER — Other Ambulatory Visit: Payer: Self-pay

## 2019-05-03 ENCOUNTER — Other Ambulatory Visit (HOSPITAL_COMMUNITY)
Admission: RE | Admit: 2019-05-03 | Discharge: 2019-05-03 | Disposition: A | Discharge status: Home / Self Care | Payer: Medicaid Other | Source: Ambulatory Visit | Attending: Obstetrics and Gynecology | Admitting: Obstetrics and Gynecology

## 2019-05-03 DIAGNOSIS — Z1159 Encounter for screening for other viral diseases: Secondary | ICD-10-CM | POA: Insufficient documentation

## 2019-05-03 DIAGNOSIS — Z01812 Encounter for preprocedural laboratory examination: Secondary | ICD-10-CM | POA: Insufficient documentation

## 2019-05-03 NOTE — MAU Note (Signed)
Asymptomatic, swab collected without problem. 

## 2019-05-04 LAB — NOVEL CORONAVIRUS, NAA (HOSP ORDER, SEND-OUT TO REF LAB; TAT 18-24 HRS): SARS-CoV-2, NAA: NOT DETECTED

## 2019-05-06 ENCOUNTER — Other Ambulatory Visit: Payer: Self-pay | Admitting: Obstetrics and Gynecology

## 2019-05-06 NOTE — H&P (Deleted)
  The note originally documented on this encounter has been moved the the encounter in which it belongs.  

## 2019-05-06 NOTE — H&P (Signed)
Julia [name] is a 33 y.o. female [id number]  at 1537 1/7 weeks (EDD 05/27/19 by LMP c/w 11 week US)  presenting for IOL at term with diagnosis of cholestasis and gestational DM.  Pt with bile acids of 10.9 and sx controlled on actigall with antenatal surveillance with NST's twice weeekly reassuring.  She has GDM well-controlled on metformin 500mg  po BID.  SHe had some anxiety/depression this pregnancy but declined medications and has been able to cope.   On her anatomy scan there was concern for a possible developing fetal pericardial effusion, but f/u US with MFM was WNL.  OB History    Gravida  5   Para  3   Term  3   Preterm  0   AB  1   Living  3     SAB  1   TAB  0   Ectopic  0   Multiple  0   Live Births  3         07-22-2002, 38 wks  M, 7lbs 2oz, Vaginal  12-12-2004 SAB 05-04-2007, 38 wks F, 7lbs 5oz, Vaginal 01-29-2016, 37 wks 9lbs, Vaginal Delivery  Past Medical History:  Diagnosis Date  . Anxiety   . Arthritis   . Back pain   . Chlamydia 2007  . Cholestasis during pregnancy   . Complication of anesthesia   . Depression   . Dyspepsia   . GERD (gastroesophageal reflux disease)   . Gestational diabetes   . Infection   . Kidney stones   . Migraine   . PONV (postoperative nausea and vomiting)   . Right sided weakness   . Sinusitis   . Vaginal Pap smear, abnormal    Past Surgical History:  Procedure Laterality Date  . CHOLECYSTECTOMY N/A 04/15/2013   Procedure: LAPAROSCOPIC CHOLECYSTECTOMY;  Surgeon: Shelly Rubensteinouglas A Blackman, MD;  Location: [facility] OR;  Service: General;  Laterality: N/A;  . COLONOSCOPY  2012  . COLPOSCOPY    . DILATION AND CURETTAGE OF UTERUS    . ESOPHAGEAL MANOMETRY N/A 03/02/2015   Procedure: ESOPHAGEAL MANOMETRY (EM);  Surgeon: Willis ModenaWilliam Outlaw, MD;  Location: [facility] END[facility]COPY;  Service: Endoscopy;  Laterality: N/A;  . ESOPHAGOGASTRODUODENOSCOPY (EGD) WITH PROPOFOL N/A 04/02/2014   Procedure: ESOPHAGOGASTRODUODENOSCOPY (EGD) WITH PROPOFOL;   Surgeon: Willis ModenaWilliam Outlaw, MD;  Location: [facility];  Service: Endoscopy;  Laterality: N/A;  . EUS N/A 04/02/2014   Procedure: ESOPHAGEAL ENDOSCOPIC ULTRASOUND (EUS) RADIAL;  Surgeon: Willis ModenaWilliam Outlaw, MD;  Location: [facility];  Service: Endoscopy;  Laterality: N/A;  . UPPER GASTROINTESTINAL ENDOSCOPY  2012   Family History: family history includes Diabetes in her mother; Diabetes type II in her mother; Hyperlipidemia in her father; Hypertension in her father and mother. Social History:  reports that she has never smoked. She has never used smokeless tobacco. She reports that she does not drink alcohol or use drugs.     Maternal Diabetes: Yes:  Diabetes Type:  Insulin/Medication controlled Genetic Screening: Declined Maternal Ultrasounds/Referrals: Normal Fetal Ultrasounds or other Referrals:  None Maternal Substance Abuse:  No Significant Maternal Medications:  Meds include: Other: Actigall and metformin Significant Maternal Lab Results:  None Other Comments:  None  Review of Systems  Eyes: Negative for blurred vision.  Gastrointestinal: Negative for abdominal pain.   Maternal Medical History:  Contractions: Frequency: irregular.   Perceived severity is mild.    Fetal activity: Perceived fetal activity is normal.    Prenatal complications: Cholestasis, gestational DM  Prenatal Complications - Diabetes: gestational. Diabetes is  managed by oral agent (monotherapy).        Last menstrual period 08/20/2018, currently breastfeeding. Maternal Exam:  Uterine Assessment: Contraction strength is mild.  Contraction frequency is irregular.   Abdomen: Patient reports no abdominal tenderness. Fetal presentation: vertex  Introitus: Normal vulva. Normal vagina.    Physical Exam  Constitutional: She appears well-developed.  Cardiovascular: Normal rate and regular rhythm.  Respiratory: Effort normal.  GI: Soft.  Genitourinary:    Vulva and vagina normal.   Neurological: She is  alert.  Psychiatric: She has a normal mood and affect.    Prenatal labs: ABO, Rh: A/Positive/-- (12/02 0000) Antibody: Negative (12/02 0000) Rubella: Immune (12/02 0000) RPR: Nonreactive (12/02 0000)  HBsAg: Negative (12/02 0000)  HIV: Non-reactive (12/02 0000)  GBS:   negative One hour GCT 163 Three hour GTT abnormal Hgb AA  Assessment/Plan: IOL at term.  Plan AROM and pitocin.  Epidural as desired  Oliver Pila 05/06/2019, 3:48 PM

## 2019-05-07 ENCOUNTER — Inpatient Hospital Stay (HOSPITAL_COMMUNITY)
Admission: AD | Admit: 2019-05-07 | Discharge: 2019-05-10 | DRG: 805 | Disposition: A | Discharge status: Home / Self Care | Payer: Medicaid Other | Attending: Obstetrics and Gynecology | Admitting: Obstetrics and Gynecology

## 2019-05-07 ENCOUNTER — Other Ambulatory Visit: Payer: Self-pay

## 2019-05-07 ENCOUNTER — Inpatient Hospital Stay (HOSPITAL_COMMUNITY): Payer: Medicaid Other | Admitting: Anesthesiology

## 2019-05-07 ENCOUNTER — Encounter (HOSPITAL_COMMUNITY): Payer: Self-pay | Admitting: *Deleted

## 2019-05-07 ENCOUNTER — Inpatient Hospital Stay (HOSPITAL_COMMUNITY): Payer: Medicaid Other

## 2019-05-07 DIAGNOSIS — Z3A37 37 weeks gestation of pregnancy: Secondary | ICD-10-CM

## 2019-05-07 DIAGNOSIS — K831 Obstruction of bile duct: Secondary | ICD-10-CM | POA: Diagnosis present

## 2019-05-07 DIAGNOSIS — O411230 Chorioamnionitis, third trimester, not applicable or unspecified: Secondary | ICD-10-CM | POA: Diagnosis present

## 2019-05-07 DIAGNOSIS — D649 Anemia, unspecified: Secondary | ICD-10-CM | POA: Diagnosis present

## 2019-05-07 DIAGNOSIS — O9902 Anemia complicating childbirth: Secondary | ICD-10-CM | POA: Diagnosis present

## 2019-05-07 DIAGNOSIS — O26613 Liver and biliary tract disorders in pregnancy, third trimester: Secondary | ICD-10-CM | POA: Diagnosis present

## 2019-05-07 DIAGNOSIS — O24425 Gestational diabetes mellitus in childbirth, controlled by oral hypoglycemic drugs: Secondary | ICD-10-CM | POA: Diagnosis present

## 2019-05-07 DIAGNOSIS — O2662 Liver and biliary tract disorders in childbirth: Secondary | ICD-10-CM | POA: Diagnosis present

## 2019-05-07 DIAGNOSIS — O26643 Intrahepatic cholestasis of pregnancy, third trimester: Secondary | ICD-10-CM | POA: Diagnosis present

## 2019-05-07 LAB — CBC
HCT: 31.6 % — ABNORMAL LOW (ref 36.0–46.0)
Hemoglobin: 9.8 g/dL — ABNORMAL LOW (ref 12.0–15.0)
MCH: 25.7 pg — ABNORMAL LOW (ref 26.0–34.0)
MCHC: 31 g/dL (ref 30.0–36.0)
MCV: 82.9 fL (ref 80.0–100.0)
Platelets: 279 10*3/uL (ref 150–400)
RBC: 3.81 MIL/uL — ABNORMAL LOW (ref 3.87–5.11)
RDW: 14.3 % (ref 11.5–15.5)
WBC: 8.2 10*3/uL (ref 4.0–10.5)
nRBC: 0 % (ref 0.0–0.2)

## 2019-05-07 LAB — BASIC METABOLIC PANEL
Anion gap: 10 (ref 5–15)
BUN: 9 mg/dL (ref 6–20)
CO2: 17 mmol/L — ABNORMAL LOW (ref 22–32)
Calcium: 8.9 mg/dL (ref 8.9–10.3)
Chloride: 108 mmol/L (ref 98–111)
Creatinine, Ser: 0.67 mg/dL (ref 0.44–1.00)
GFR calc Af Amer: >60 mL/min (ref >60)
GFR calc non Af Amer: >60 mL/min (ref >60)
Glucose, Bld: 88 mg/dL (ref 70–99)
Potassium: 3.7 mmol/L (ref 3.5–5.1)
Sodium: 135 mmol/L (ref 135–145)

## 2019-05-07 LAB — GLUCOSE, CAPILLARY
Glucose-Capillary: 91 mg/dL (ref 70–99)
Glucose-Capillary: 73 mg/dL (ref 70–99)
Glucose-Capillary: 76 mg/dL (ref 70–99)
Glucose-Capillary: 114 mg/dL — ABNORMAL HIGH (ref 70–99)

## 2019-05-07 LAB — TYPE AND SCREEN
ABO/RH(D): A POS
Antibody Screen: NEGATIVE

## 2019-05-07 LAB — RPR: RPR Ser Ql: NONREACTIVE

## 2019-05-07 LAB — ABO/RH: ABO/RH(D): A POS

## 2019-05-07 MED ORDER — OXYCODONE-ACETAMINOPHEN 5-325 MG PO TABS: 1.0000 | ORAL_TABLET | ORAL | Status: DC | PRN

## 2019-05-07 MED ORDER — SODIUM CHLORIDE (PF) 0.9 % IJ SOLN
INTRAMUSCULAR | Status: DC | PRN
  Administered 2019-05-07: 14 mL/h via EPIDURAL

## 2019-05-07 MED ORDER — LACTATED RINGERS IV SOLN: 500.0000 mL | INTRAVENOUS | Status: DC | PRN

## 2019-05-07 MED ORDER — OXYTOCIN 40 UNITS IN NORMAL SALINE INFUSION - SIMPLE MED
1.0000 m[IU]/min | INTRAVENOUS | Status: DC
  Administered 2019-05-07: 17:00:00 26 m[IU]/min via INTRAVENOUS
  Administered 2019-05-07: 08:00:00 2 m[IU]/min via INTRAVENOUS
  Administered 2019-05-08: 06:00:00 38 m[IU]/min via INTRAVENOUS
  Filled 2019-05-07 (×3): qty 1000

## 2019-05-07 MED ORDER — LIDOCAINE HCL (PF) 1 % IJ SOLN: 30.0000 mL | INTRAMUSCULAR | Status: DC | PRN

## 2019-05-07 MED ORDER — ACETAMINOPHEN 325 MG PO TABS: 650.0000 mg | ORAL_TABLET | ORAL | Status: DC | PRN

## 2019-05-07 MED ORDER — SOD CITRATE-CITRIC ACID 500-334 MG/5ML PO SOLN: 30.0000 mL | ORAL | Status: DC | PRN

## 2019-05-07 MED ORDER — TERBUTALINE SULFATE 1 MG/ML IJ SOLN: 0.2500 mg | Freq: Once | INTRAMUSCULAR | Status: DC | PRN

## 2019-05-07 MED ORDER — PHENYLEPHRINE 40 MCG/ML (10ML) SYRINGE FOR IV PUSH (FOR BLOOD PRESSURE SUPPORT)
80.0000 ug | PREFILLED_SYRINGE | INTRAVENOUS | Status: DC | PRN
  Filled 2019-05-07: qty 10

## 2019-05-07 MED ORDER — OXYTOCIN BOLUS FROM INFUSION
500.0000 mL | Freq: Once | INTRAVENOUS | Status: AC
  Administered 2019-05-08: 12:00:00 500 mL via INTRAVENOUS

## 2019-05-07 MED ORDER — OXYCODONE-ACETAMINOPHEN 5-325 MG PO TABS: 2.0000 | ORAL_TABLET | ORAL | Status: DC | PRN

## 2019-05-07 MED ORDER — OXYTOCIN 40 UNITS IN NORMAL SALINE INFUSION - SIMPLE MED: 2.5000 [IU]/h | INTRAVENOUS | Status: DC

## 2019-05-07 MED ORDER — ONDANSETRON HCL 4 MG/2ML IJ SOLN: 4.0000 mg | Freq: Four times a day (QID) | INTRAMUSCULAR | Status: DC | PRN

## 2019-05-07 MED ORDER — PHENYLEPHRINE 40 MCG/ML (10ML) SYRINGE FOR IV PUSH (FOR BLOOD PRESSURE SUPPORT)
80.0000 ug | PREFILLED_SYRINGE | INTRAVENOUS | Status: DC | PRN
  Filled 2019-05-07 (×2): qty 10

## 2019-05-07 MED ORDER — FENTANYL-BUPIVACAINE-NACL 0.5-0.125-0.9 MG/250ML-% EP SOLN
12.0000 mL/h | EPIDURAL | Status: DC | PRN
  Administered 2019-05-08: 05:00:00 12 mL/h via EPIDURAL
  Filled 2019-05-07 (×2): qty 250

## 2019-05-07 MED ORDER — EPHEDRINE 5 MG/ML INJ
10.0000 mg | INTRAVENOUS | Status: DC | PRN
  Filled 2019-05-07: qty 2

## 2019-05-07 MED ORDER — LIDOCAINE HCL (PF) 1 % IJ SOLN
INTRAMUSCULAR | Status: DC | PRN
  Administered 2019-05-07: 10 mL via EPIDURAL

## 2019-05-07 MED ORDER — FENTANYL CITRATE (PF) 100 MCG/2ML IJ SOLN: 50.0000 ug | INTRAMUSCULAR | Status: DC | PRN

## 2019-05-07 MED ORDER — EPHEDRINE 5 MG/ML INJ
10.0000 mg | INTRAVENOUS | Status: DC | PRN
  Administered 2019-05-07: 19:00:00 10 mg via INTRAVENOUS
  Filled 2019-05-07: qty 10
  Filled 2019-05-07: qty 2

## 2019-05-07 MED ORDER — LACTATED RINGERS IV SOLN: 500.0000 mL | Freq: Once | INTRAVENOUS | Status: DC

## 2019-05-07 MED ORDER — DIPHENHYDRAMINE HCL 50 MG/ML IJ SOLN
12.5000 mg | INTRAMUSCULAR | Status: DC | PRN
  Filled 2019-05-07: qty 1

## 2019-05-07 MED ORDER — LACTATED RINGERS IV SOLN
INTRAVENOUS | Status: DC
  Administered 2019-05-07 – 2019-05-08 (×5): via INTRAVENOUS

## 2019-05-07 NOTE — Progress Notes (Signed)
Patient ID: Julia [name], female   DOB: 1986/10/28, 33 y.o.   MRN: [id number] Pt comfortable  FHR category 1 afeb VSS  Cervix 4/70/-1 at 2040pm  Pt has had a very protracted latent phase labor and has been difficult to achieve adequate contractions  Pitocin at 26 mu and will continue to increase to see if can gain improved contraction strength

## 2019-05-07 NOTE — Progress Notes (Signed)
Patient ID: Julia [name], female   DOB: 09-May-1986, 33 y.o.   MRN: [id number] Pt comfortable  afeb VSS FHR category 1  80/3-4/-2  IUPC not adequate  Pitocin at 26 mu but still not adequate.  Will change out pitocin bag and continue to adjust

## 2019-05-07 NOTE — Progress Notes (Signed)
Patient ID: Julia [name], female   DOB: 14-Jun-1986, 33 y.o.   MRN: [id number] Pt admitted and started on pitocin  afeb vss  FHR Category 1 Cervix 40/1-2/-2  AROM clear  Pitocin per protocol Epidural prn BS this AM 91

## 2019-05-07 NOTE — Progress Notes (Signed)
Patient ID: Julia [name], female   DOB: 08/04/1986, 33 y.o.   MRN: [id number] Comfortable with epidural  afeb VSS FHR category 1  Cervix 80/3/-2  IUPC placed for pitocin adjustment

## 2019-05-07 NOTE — Anesthesia Procedure Notes (Signed)
Epidural Patient location during procedure: OB Start time: 05/07/2019 12:58 PM End time: 05/07/2019 1:08 PM  Staffing Anesthesiologist: Lucretia Kern, MD Performed: anesthesiologist   Preanesthetic Checklist Completed: patient identified, pre-op evaluation, timeout performed, IV checked, risks and benefits discussed and monitors and equipment checked  Epidural Patient position: sitting Prep: DuraPrep Patient monitoring: heart rate, continuous pulse ox and blood pressure Approach: midline Location: L3-L4 Injection technique: LOR air  Needle:  Needle type: Tuohy  Needle gauge: 17 G Needle length: 9 cm Needle insertion depth: 6 cm Catheter type: closed end flexible Catheter size: 19 Gauge Catheter at skin depth: 11 cm Test dose: negative  Assessment Events: blood not aspirated, injection not painful, no injection resistance, negative IV test and no paresthesia  Additional Notes Reason for block:procedure for pain

## 2019-05-07 NOTE — Anesthesia Preprocedure Evaluation (Signed)
Anesthesia Evaluation  Patient identified by MRN, date of birth, ID band Patient awake    Reviewed: Allergy & Precautions, H&P , NPO status , Patient's Chart, lab work & pertinent test results  History of Anesthesia Complications Negative for: history of anesthetic complications  Airway Mallampati: II  TM Distance: >3 FB Neck ROM: full    Dental no notable dental hx.    Pulmonary neg pulmonary ROS,    Pulmonary exam normal        Cardiovascular negative cardio ROS Normal cardiovascular exam Rhythm:regular Rate:Normal     Neuro/Psych negative neurological ROS  negative psych ROS   GI/Hepatic Neg liver ROS, GERD  ,  Endo/Other  diabetes, Gestational  Renal/GU negative Renal ROS  negative genitourinary   Musculoskeletal   Abdominal   Peds  Hematology  (+) Blood dyscrasia, anemia ,   Anesthesia Other Findings   Reproductive/Obstetrics (+) Pregnancy                             Anesthesia Physical Anesthesia Plan  ASA: II  Anesthesia Plan: Epidural   Post-op Pain Management:    Induction:   PONV Risk Score and Plan:   Airway Management Planned:   Additional Equipment:   Intra-op Plan:   Post-operative Plan:   Informed Consent: I have reviewed the patients History and Physical, chart, labs and discussed the procedure including the risks, benefits and alternatives for the proposed anesthesia with the patient or authorized representative who has indicated his/her understanding and acceptance.       Plan Discussed with:   Anesthesia Plan Comments:         Anesthesia Quick Evaluation

## 2019-05-08 ENCOUNTER — Encounter (HOSPITAL_COMMUNITY): Payer: Self-pay

## 2019-05-08 LAB — GLUCOSE, CAPILLARY
Glucose-Capillary: 91 mg/dL (ref 70–99)
Glucose-Capillary: 110 mg/dL — ABNORMAL HIGH (ref 70–99)
Glucose-Capillary: 120 mg/dL — ABNORMAL HIGH (ref 70–99)

## 2019-05-08 MED ORDER — SENNOSIDES-DOCUSATE SODIUM 8.6-50 MG PO TABS
2.0000 | ORAL_TABLET | ORAL | Status: DC
  Administered 2019-05-08 – 2019-05-10 (×2): 2 via ORAL
  Filled 2019-05-08 (×2): qty 2

## 2019-05-08 MED ORDER — SIMETHICONE 80 MG PO CHEW: 80.0000 mg | CHEWABLE_TABLET | ORAL | Status: DC | PRN

## 2019-05-08 MED ORDER — SODIUM CHLORIDE 0.9 % IV SOLN
3.0000 g | Freq: Four times a day (QID) | INTRAVENOUS | Status: DC
  Administered 2019-05-08 – 2019-05-09 (×5): 3 g via INTRAVENOUS
  Filled 2019-05-08 (×8): qty 3

## 2019-05-08 MED ORDER — ZOLPIDEM TARTRATE 5 MG PO TABS: 5.0000 mg | ORAL_TABLET | Freq: Every evening | ORAL | Status: DC | PRN

## 2019-05-08 MED ORDER — ACETAMINOPHEN 160 MG/5ML PO SOLN
1000.0000 mg | Freq: Four times a day (QID) | ORAL | Status: DC | PRN
  Administered 2019-05-08: 06:00:00 1000 mg via ORAL
  Filled 2019-05-08 (×2): qty 40.6

## 2019-05-08 MED ORDER — OXYCODONE HCL 5 MG PO TABS
5.0000 mg | ORAL_TABLET | ORAL | Status: DC | PRN
  Filled 2019-05-08: qty 1

## 2019-05-08 MED ORDER — ONDANSETRON HCL 4 MG/2ML IJ SOLN: 4.0000 mg | INTRAMUSCULAR | Status: DC | PRN

## 2019-05-08 MED ORDER — ONDANSETRON HCL 4 MG PO TABS: 4.0000 mg | ORAL_TABLET | ORAL | Status: DC | PRN

## 2019-05-08 MED ORDER — SODIUM BICARBONATE 8.4 % IV SOLN
INTRAVENOUS | Status: DC | PRN
  Administered 2019-05-08: 7 mL via EPIDURAL

## 2019-05-08 MED ORDER — WITCH HAZEL-GLYCERIN EX PADS: 1.0000 "application " | MEDICATED_PAD | CUTANEOUS | Status: DC | PRN

## 2019-05-08 MED ORDER — PRENATAL MULTIVITAMIN CH
1.0000 | ORAL_TABLET | Freq: Every day | ORAL | Status: DC
  Administered 2019-05-09 – 2019-05-10 (×2): 1 via ORAL
  Filled 2019-05-08 (×2): qty 1

## 2019-05-08 MED ORDER — DIPHENHYDRAMINE HCL 50 MG/ML IJ SOLN
25.0000 mg | Freq: Once | INTRAMUSCULAR | Status: AC
  Administered 2019-05-08: 10:00:00 25 mg via INTRAVENOUS

## 2019-05-08 MED ORDER — IBUPROFEN 600 MG PO TABS
600.0000 mg | ORAL_TABLET | Freq: Four times a day (QID) | ORAL | Status: DC
  Administered 2019-05-08 – 2019-05-10 (×9): 600 mg via ORAL
  Filled 2019-05-08 (×9): qty 1

## 2019-05-08 MED ORDER — SODIUM CHLORIDE 0.9 % IV SOLN
3.0000 g | Freq: Three times a day (TID) | INTRAVENOUS | Status: DC
  Administered 2019-05-08 (×2): 3 g via INTRAVENOUS
  Filled 2019-05-08 (×3): qty 3

## 2019-05-08 MED ORDER — DIPHENHYDRAMINE HCL 25 MG PO CAPS: 25.0000 mg | ORAL_CAPSULE | Freq: Four times a day (QID) | ORAL | Status: DC | PRN

## 2019-05-08 MED ORDER — COCONUT OIL OIL
1.0000 "application " | TOPICAL_OIL | Status: DC | PRN
  Administered 2019-05-08: 1 via TOPICAL

## 2019-05-08 MED ORDER — DIBUCAINE (PERIANAL) 1 % EX OINT: 1.0000 "application " | TOPICAL_OINTMENT | CUTANEOUS | Status: DC | PRN

## 2019-05-08 MED ORDER — OXYCODONE HCL 5 MG PO TABS: 10.0000 mg | ORAL_TABLET | ORAL | Status: DC | PRN

## 2019-05-08 MED ORDER — ACETAMINOPHEN 325 MG PO TABS
650.0000 mg | ORAL_TABLET | ORAL | Status: DC | PRN
  Administered 2019-05-08 – 2019-05-09 (×5): 650 mg via ORAL
  Filled 2019-05-08 (×5): qty 2

## 2019-05-08 MED ORDER — BENZOCAINE-MENTHOL 20-0.5 % EX AERO
1.0000 "application " | INHALATION_SPRAY | CUTANEOUS | Status: DC | PRN
  Administered 2019-05-08: 1 via TOPICAL
  Filled 2019-05-08: qty 56

## 2019-05-08 MED ORDER — TETANUS-DIPHTH-ACELL PERTUSSIS 5-2.5-18.5 LF-MCG/0.5 IM SUSP: 0.5000 mL | Freq: Once | INTRAMUSCULAR | Status: DC

## 2019-05-08 NOTE — Progress Notes (Signed)
Patient ID: Julia [name], female   DOB: Oct 26, 1986, 33 y.o.   MRN: [id number] Pt feeling better after PCa of epidural  99.6   FHR with good variability, occasional mild variable decelerations  Cervix c/8-9/0--stretchy, mostly on pt right Baby still OP  Attempted a push to see if cervix would reduce but it would not, good maternal effort Pt placed in right exaggerated Sims with peanut Will reassess in 1-2 hours Unasyn for presumed chorioamnionitis

## 2019-05-08 NOTE — Progress Notes (Signed)
Patient ID: Julia [name], female   DOB: May 19, 1986, 33 y.o.   MRN: [id number] Pt uncomfortable with pressure in front lower pelvis  Temp still at 99-100 FHR with good variability but baseline continuing to rise at 165-170  Cervix 90/7-8/+1 OP  Pt continues to make slow steady progress now that in active phase labor Will give unasyn for increasing temp and baseline Tylenol prn

## 2019-05-08 NOTE — Progress Notes (Signed)
Patient ID: Julia [name], female   DOB: 1986-11-20, 33 y.o.   MRN: [id number] Pt feeling some occasional pressure  Tmax 99.8 VSS FHR with good variability and increase baseline to 160 c/w low grade temp, occasional mild variables  90/7/-1 per RN exam at 0330  After an extremely protracted latent phase pt is finally making progress on 36 mu of pitocin Will watch temp and if goes above 100 begin antibiotics Recheck cervix at 5-530am

## 2019-05-08 NOTE — Anesthesia Postprocedure Evaluation (Signed)
Anesthesia Post Note  Patient: Julia [name]  Procedure(s) Performed: AN AD HOC LABOR EPIDURAL     Patient location during evaluation: Mother Baby Anesthesia Type: Epidural Level of consciousness: awake and alert Pain management: pain level controlled Vital Signs Assessment: post-procedure vital signs reviewed and stable Respiratory status: spontaneous breathing, nonlabored ventilation and respiratory function stable Cardiovascular status: stable Postop Assessment: no headache, no backache and epidural receding Anesthetic complications: no    Last Vitals:  Vitals:   05/08/19 1516 05/08/19 1930  BP: 119/72 109/66  Pulse: 86 76  Resp:    Temp: 36.9 C 36.7 C  SpO2: 99% 100%    Last Pain:  Vitals:   05/08/19 2020  TempSrc:   PainSc: 7                  Atalia Litzinger

## 2019-05-08 NOTE — Progress Notes (Signed)
Patient ID: Julia [name], female   DOB: 1986/11/15, 33 y.o.   MRN: [id number] Pt comfortable with epidural. No complaints VS- 99/55, 103, 14 EFM - 155, cat 1 TOCO - ctxs irreg q 1-6nubs; mvus 80-120 SVE - 8/90/0; cervix swollen mildly on right  A/P: Prolonged labor after iol; pitocin at ; cat 1 strip         Will give 25mg  benadryl iv x 1 now          Recheck cervix in 1-2 hours         Pt counseled on possible need for c/s         I do not believe increasing pitocin will help at this time; possible risks discussed

## 2019-05-09 LAB — CBC
HCT: 27.4 % — ABNORMAL LOW (ref 36.0–46.0)
Hemoglobin: 8.8 g/dL — ABNORMAL LOW (ref 12.0–15.0)
MCH: 26.2 pg (ref 26.0–34.0)
MCHC: 32.1 g/dL (ref 30.0–36.0)
MCV: 81.5 fL (ref 80.0–100.0)
Platelets: 251 10*3/uL (ref 150–400)
RBC: 3.36 MIL/uL — ABNORMAL LOW (ref 3.87–5.11)
RDW: 14.6 % (ref 11.5–15.5)
WBC: 14.3 10*3/uL — ABNORMAL HIGH (ref 4.0–10.5)
nRBC: 0 % (ref 0.0–0.2)

## 2019-05-09 NOTE — Progress Notes (Addendum)
Post Partum Day 1 Subjective: no complaints, up ad lib, voiding, tolerating PO and nl lochia, pain controlled.    Objective: Blood pressure (!) 99/59, pulse 90, temperature 98.2 F (36.8 C), temperature source Oral, resp. rate 18, height 5' (1.524 m), weight 75.3 kg, last menstrual period 08/20/2018, SpO2 100 %, unknown if currently breastfeeding.  Physical Exam:  General: alert and no distress Lochia: appropriate Uterine Fundus: firm   Recent Labs    05/07/19 0800 05/09/19 0538  HGB 9.8* 8.8*  HCT 31.6* 27.4*    Assessment/Plan: Plan for discharge tomorrow.  Baby in NICU - CPAP. abx - ampiciliin/gentamicin     LOS: 2 days   Boss Danielsen Bovard-Stuckert 05/09/2019, 7:12 AM

## 2019-05-09 NOTE — Anesthesia Postprocedure Evaluation (Signed)
Anesthesia Post Note  Patient: Julia [name]  Procedure(s) Performed: AN AD HOC LABOR EPIDURAL     Patient location during evaluation: Mother Baby Anesthesia Type: Epidural Level of consciousness: awake and alert Pain management: pain level controlled Vital Signs Assessment: post-procedure vital signs reviewed and stable Respiratory status: spontaneous breathing, nonlabored ventilation and respiratory function stable Cardiovascular status: stable Postop Assessment: no headache, no backache and epidural receding Anesthetic complications: no    Last Vitals:  Vitals:   05/09/19 0526 05/09/19 0808  BP: (!) 99/59 (!) 127/48  Pulse: 90 83  Resp: 18 18  Temp: 36.8 C 36.6 C  SpO2: 100% 100%    Last Pain:  Vitals:   05/09/19 1305  TempSrc:   PainSc: 7    Pain Goal: Patients Stated Pain Goal: 4 (05/09/19 4128)                 Marrion Coy

## 2019-05-09 NOTE — Lactation Note (Signed)
This note was copied from a baby's chart. Lactation Consultation Note Baby 18 hrs old. In NICU for respiratory distress. Baby has NG tube receiving feedings.  Mom has DEBP at bedside. Mom has pumped, states she didn't get anything. Explained to mom normal, pump for stimulation/lactation induction.  Mom BF her 35 yr old for 2 yrs. Her 25 yr old for 4 months, 35 yr old for 8 months. Mom plans to BF this baby.  Mom is being taken to NICU by staff in w/c to visit baby. Encouraged mom to call for questions or concerns.  NICU and Lactation brochure given. Milk storage discussed w/mom.   Patient Name: Julia [name] Today's Date: 05/09/2019 Reason for consult: Initial assessment;NICU baby;Early term 37-38.6wks   Maternal Data Has patient been taught Hand Expression?: Yes Does the patient have breastfeeding experience prior to this delivery?: Yes  Feeding    LATCH Score                   Interventions Interventions: DEBP  Lactation Tools Discussed/Used Tools: Pump Breast pump type: Double-Electric Breast Pump WIC Program: No Pump Review: Setup, frequency, and cleaning;Milk Storage Initiated by:: RN Date initiated:: 05/08/19   Consult Status Consult Status: Follow-up Date: 05/10/19 Follow-up type: In-patient    Oaklyn Jakubek, Diamond Nickel 05/09/2019, 6:01 AM

## 2019-05-09 NOTE — Addendum Note (Signed)
Addendum  created 05/09/19 1521 by Orlie Pollen, CRNA   Clinical Note Signed

## 2019-05-10 MED ORDER — ACETAMINOPHEN 325 MG PO TABS: 650.0000 mg | ORAL_TABLET | ORAL | 0 refills | Status: AC | PRN

## 2019-05-10 MED ORDER — IBUPROFEN 600 MG PO TABS: 600.0000 mg | ORAL_TABLET | Freq: Four times a day (QID) | ORAL | 0 refills | Status: AC

## 2019-05-10 NOTE — Progress Notes (Signed)
1320- Discharge instructions given to patient. Baby and Me book given and reviewed.Paitent verbalizes understanding of postpartum care, pp depression, medications, and follow-up.

## 2019-05-10 NOTE — Progress Notes (Signed)
Patient discharged ambulatory with family

## 2019-05-10 NOTE — Progress Notes (Signed)
Post Partum Day 2 Subjective: no complaints, up ad lib and tolerating PO  Objective: Blood pressure 114/70, pulse 71, temperature 97.8 F (36.6 C), temperature source Oral, resp. rate 17, height 5' (1.524 m), weight 75.3 kg, last menstrual period 08/20/2018, SpO2 100 %, unknown if currently breastfeeding.  Physical Exam:  General: alert and cooperative Lochia: appropriate Uterine Fundus: firm   Recent Labs    05/09/19 0538  HGB 8.8*  HCT 27.4*    Assessment/Plan: Discharge home, will move up to NICU to be with baby No fever since delivery Declines circumcision   LOS: 3 days   Oliver Pila 05/10/2019, 12:51 PM

## 2019-05-10 NOTE — Discharge Summary (Signed)
OB Discharge Summary     Patient Name: Julia [name] DOB: 03-May-1986 MRN: [id number]  Date of admission: 05/07/2019 Delivering MD: Pryor OchoaBANGA, CECILIA [facility]   Date of discharge: 05/10/2019  Admitting diagnosis: pregnancy Intrauterine pregnancy: 432w2d     Secondary diagnosis:  Active Problems:   Cholestasis during pregnancy in third trimester   SVD (spontaneous vaginal delivery)   Postpartum care following vaginal delivery  Additional problems: Gestational diabetes     Discharge diagnosis: Term Pregnancy Delivered                                                                                                Post partum procedures:none  Augmentation: AROM and Pitocin  Complications: None  Hospital course:  Induction of Labor With Vaginal Delivery   33 y.o. yo [id number] at 1232w2d was admitted to the hospital 05/07/2019 for induction of labor.  Indication for induction: Cholestasis of pregnancy.  Patient had a complicated labor course with a protracted latent phase labor.  Labor as follows: Membrane Rupture Time/Date: 8:59 AM ,05/07/2019   Intrapartum Procedures: Episiotomy: None [1]                                         Lacerations:  None [1]  Patient had delivery of a Viable infant.  Information for the patient's newborn:  Olegario Messierrguello [name], Boy Tiyah [[id number]][[id number]]  Delivery Method: Vag-Spont   05/08/2019  Details of delivery can be found in separate delivery note.  Patient had a routine postpartum course. Patient is discharged home 05/10/19.  Physical exam  Vitals:   05/09/19 1913 05/10/19 0027 05/10/19 0530 05/10/19 0715  BP: 113/69 112/70 113/62 114/70  Pulse: 95 82 74 71  Resp: 18 17 17 17   Temp: (!) 97.5 F (36.4 C) 98.1 F (36.7 C) 98.3 F (36.8 C) 97.8 F (36.6 C)  TempSrc:  Oral Oral Oral  SpO2: 100% 100% 100% 100%  Weight:      Height:       General: alert and cooperative Lochia: appropriate Uterine Fundus: firm  Labs: Lab Results  Component Value  Date   WBC 14.3 (H) 05/09/2019   HGB 8.8 (L) 05/09/2019   HCT 27.4 (L) 05/09/2019   MCV 81.5 05/09/2019   PLT 251 05/09/2019   CMP Latest Ref Rng & Units 05/07/2019  Glucose 70 - 99 mg/dL 88  BUN 6 - 20 mg/dL 9  Creatinine 3.470.44 - 4.251.00 mg/dL 9.560.67  Sodium 387135 - 564145 mmol/L 135  Potassium 3.5 - 5.1 mmol/L 3.7  Chloride 98 - 111 mmol/L 108  CO2 22 - 32 mmol/L 17(L)  Calcium 8.9 - 10.3 mg/dL 8.9  Total Protein 6.5 - 8.1 g/dL -  Total Bilirubin 0.3 - 1.2 mg/dL -  Alkaline Phos 38 - 332126 U/L -  AST 15 - 41 U/L -  ALT 14 - 54 U/L -    Discharge instruction: per After Visit Summary and "Baby and Me Booklet".  After visit meds:  Allergies as of 05/10/2019  No Known Allergies     Medication List    STOP taking these medications   Doxylamine-Pyridoxine 10-10 MG Tbec Commonly known as:  Diclegis   metFORMIN 500 MG tablet Commonly known as:  GLUCOPHAGE   ursodiol 300 MG capsule Commonly known as:  ACTIGALL     TAKE these medications   acetaminophen 325 MG tablet Commonly known as:  Tylenol Take 2 tablets (650 mg total) by mouth every 4 (four) hours as needed (for pain scale < 4).   ibuprofen 600 MG tablet Commonly known as:  ADVIL Take 1 tablet (600 mg total) by mouth every 6 (six) hours.       Diet: routine diet  Activity: Advance as tolerated. Pelvic rest for 6 weeks.   Outpatient follow up:6 weeks Follow up Appt:No future appointments. Follow up Visit:No follow-ups on file.  Postpartum contraception: Undecided  Newborn Data: Live born female  Birth Weight: 6 lb 7.3 oz (2928 g) APGAR: 8, 8  Newborn Delivery   Birth date/time:  05/08/2019 11:58:00 Delivery type:  Vaginal, Spontaneous     Baby Feeding: Breast Disposition:NICU   05/10/2019 Oliver Pila, MD

## 2019-07-24 ENCOUNTER — Other Ambulatory Visit: Payer: Self-pay

## 2019-07-24 DIAGNOSIS — Z20822 Contact with and (suspected) exposure to covid-19: Secondary | ICD-10-CM

## 2019-07-26 LAB — NOVEL CORONAVIRUS, NAA: SARS-CoV-2, NAA: NOT DETECTED

## 2019-10-08 ENCOUNTER — Encounter: Payer: Self-pay | Admitting: Sports Medicine

## 2019-10-08 ENCOUNTER — Ambulatory Visit (INDEPENDENT_AMBULATORY_CARE_PROVIDER_SITE_OTHER): Payer: Medicaid Other

## 2019-10-08 ENCOUNTER — Other Ambulatory Visit: Payer: Self-pay

## 2019-10-08 ENCOUNTER — Other Ambulatory Visit: Payer: Self-pay | Admitting: Sports Medicine

## 2019-10-08 ENCOUNTER — Ambulatory Visit: Payer: Medicaid Other | Admitting: Sports Medicine

## 2019-10-08 ENCOUNTER — Ambulatory Visit: Payer: Medicaid Other

## 2019-10-08 DIAGNOSIS — M722 Plantar fascial fibromatosis: Secondary | ICD-10-CM | POA: Diagnosis not present

## 2019-10-08 DIAGNOSIS — M79672 Pain in left foot: Secondary | ICD-10-CM

## 2019-10-08 DIAGNOSIS — M79671 Pain in right foot: Secondary | ICD-10-CM

## 2019-10-08 DIAGNOSIS — B351 Tinea unguium: Secondary | ICD-10-CM

## 2019-10-08 MED ORDER — TRIAMCINOLONE ACETONIDE 10 MG/ML IJ SUSP
10.0000 mg | Freq: Once | INTRAMUSCULAR | Status: AC
  Administered 2019-10-08: 10 mg

## 2019-10-08 NOTE — Progress Notes (Signed)
Subjective: Arelly Whittenberg is a 33 y.o. female patient presents to office with complaint of moderate heel pain on the right. Patient admits to post static dyskinesia for 1 year in duration. Patient has treated this problem with changing shoes with no relief.  Patient also admits to discoloration of right great toenail for the last 2 years reports that he gets a kicking buildup underneath that she picks out but denies any trauma to the toenail or actually losing the toenail has tried over-the-counter medication and prescription Lamisil which helped for a little bit but then the changes started to come back in the right great toenail no changes reoccurred in the left.  Patient denies any other pedal complaints.   Review of Systems  All other systems reviewed and are negative.   Patient Active Problem List   Diagnosis Date Noted  . SVD (spontaneous vaginal delivery) 05/08/2019  . Postpartum care following vaginal delivery 05/08/2019  . Cholestasis during pregnancy in third trimester 05/07/2019  . Abnormal glucose tolerance test (GTT) during pregnancy, antepartum 04/23/2019  . Breast abscess 07/08/2016  . NSVD (normal spontaneous vaginal delivery) 01/29/2016  . Encounter for trial of labor 01/28/2016  . Pelvic pain 01/21/2014  . Galactorrhea 04/08/2013  . Dysmenorrhea 04/08/2013  . Chronic cholecystitis 03/18/2013  . Unspecified constipation 01/27/2013  . Anemia 01/23/2013  . Pyelonephritis 01/22/2013    Current Outpatient Medications on File Prior to Visit  Medication Sig Dispense Refill  . acetaminophen (TYLENOL) 325 MG tablet Take 2 tablets (650 mg total) by mouth every 4 (four) hours as needed (for pain scale < 4). 30 tablet 0  . ciprofloxacin (CIPRO) 500 MG tablet Take 500 mg by mouth 2 (two) times daily.    [name] ibuprofen (ADVIL) 600 MG tablet Take 1 tablet (600 mg total) by mouth every 6 (six) hours. 30 tablet 0   No current facility-administered medications on file prior to  visit.     No Known Allergies  Objective: Physical Exam General: The patient is alert and oriented x3 in no acute distress.  Dermatology: Skin is warm, dry and supple bilateral lower extremities. Nails 1-10 are normal except at right first toe where there is distal thickening and subungual debris probable onychomycosis. There is no erythema, edema, no eccymosis, no open lesions present. Integument is otherwise unremarkable.  Vascular: Dorsalis Pedis pulse and Posterior Tibial pulse are 2/4 bilateral. Capillary fill time is immediate to all digits.  Neurological: Grossly intact to light touch with an achilles reflex of +2/5 and a  negative Tinel's sign bilateral.  Musculoskeletal: Tenderness to palpation at the medial calcaneal tubercale and through the insertion of the plantar fascia on the right foot. No pain with compression of calcaneus bilateral. No pain with tuning fork to calcaneus bilateral. No pain with calf compression bilateral. There is decreased Ankle joint range of motion bilateral. All other joints range of motion within normal limits bilateral. Strength 5/5 in all groups bilateral.   Gait: Unassisted, Antalgic avoid weight on right heel  Xray, Right foot:  Normal osseous mineralization. Joint spaces preserved. No fracture/dislocation/boney destruction. Calcaneal spur present with mild thickening of plantar fascia. No other soft tissue abnormalities or radiopaque foreign bodies.   Assessment and Plan: Problem List Items Addressed This Visit    None    Visit Diagnoses    Right foot pain    -  Primary   Plantar fasciitis, right       Nail fungus       Relevant  Orders   Cult, Fungus, Skin,Hair,Nail w/KOH   Foot pain, bilateral          -Complete examination performed.  -Xrays reviewed -Fungal culture was obtained by removing a portion of the hard nail itself from R 1st toenails using a sterile nail nipper and sent to [facility] lab. Patient tolerated the biopsy procedure  well without discomfort or need for anesthesia. -Discussed with patient in detail the condition of plantar fasciitis, how this occurs and general treatment options. Explained both conservative and surgical treatments.  -After oral consent and aseptic prep, injected a mixture containing 1 ml of 2%  plain lidocaine, 1 ml 0.5% plain marcaine, 0.5 ml of kenalog 10 and 0.5 ml of dexamethasone phosphate into right heel. Post-injection care discussed with patient.  -Oral meds were not Rx'd because is breastfeeding  -Recommended good supportive shoes and advised use of OTC insert.  -Explained and dispensed to patient daily stretching exercises. -Recommend patient to ice affected area 1-2x daily. -Patient to return to office in 4 weeks for follow up or sooner if problems or questions arise.  Landis Martins, DPM

## 2019-10-08 NOTE — Patient Instructions (Signed)

## 2019-10-17 LAB — CULT, FUNGUS, SKIN,HAIR,NAIL W/KOH
MICRO NUMBER:: 1035887
SPECIMEN QUALITY:: ADEQUATE

## 2019-11-05 ENCOUNTER — Encounter: Payer: Self-pay | Admitting: Sports Medicine

## 2019-11-05 ENCOUNTER — Other Ambulatory Visit: Payer: Self-pay

## 2019-11-05 ENCOUNTER — Ambulatory Visit: Payer: Medicaid Other | Admitting: Sports Medicine

## 2019-11-05 DIAGNOSIS — M79671 Pain in right foot: Secondary | ICD-10-CM

## 2019-11-05 DIAGNOSIS — B351 Tinea unguium: Secondary | ICD-10-CM

## 2019-11-05 DIAGNOSIS — M722 Plantar fascial fibromatosis: Secondary | ICD-10-CM

## 2019-11-05 MED ORDER — CICLOPIROX 8 % EX SOLN: Freq: Every day | CUTANEOUS | 0 refills | Status: DC

## 2019-11-05 NOTE — Patient Instructions (Addendum)
Superfeet Orthotics from [employer] or [employer] sports for plantar fasciitis   STRAPPING INSTRUCTIONS  Strapping's need to be worn for 5 days for maximum benefit.  If you next appointment is before 5 days, please remove prior to coming in.  Try to avoid putting lotion on feet during the taping process.  The tape will not stick as well and will not be as effective.  If you were given stretching exercises, start these after removal of the tape.  These exercises will actually loosen the tape and you may not receive full benefit of the strapping.  It is important that you wear sturdy shoes at all times when walking.  Bedroom shoes, slippers, flip flops, etc. are not acceptable.  **If at any time while wearing the strapping you should notice any irritation such as a rash, redness, or itching, remove the tape and wash your foot/feet thoroughly.  BATHING INSTRUCTIONS  Take a washcloth, fold it a few times and tape it around your ankle.   Then take a garbage bag, place it over your foot and angle and tape it above and below the washcloth.  TAKE A QUICK SHOWER.  The washcloth should absorb any water that runs down into the garbage bag.  If your foot gets wet, take a towel and absorb as much of the water as possible. You can also take a blow dryer, put it on low heat, and dry your bandage.

## 2019-11-05 NOTE — Progress Notes (Signed)
  Subjective: Julia [name] is a 33 y.o. female patient seen today in office for fungal culture results.  Patient reports that her right heel pain is doing a little better after steroid injection given last visit.  Patient reports that there is mainly soreness to the heel worse after a period of sitting and after she has been on her foot too long but otherwise is doing pretty well.  Patient is currently breast-feeding.  Patient has no other pedal complaints at this time.   Patient Active Problem List   Diagnosis Date Noted  . SVD (spontaneous vaginal delivery) 05/08/2019  . Postpartum care following vaginal delivery 05/08/2019  . Cholestasis during pregnancy in third trimester 05/07/2019  . Abnormal glucose tolerance test (GTT) during pregnancy, antepartum 04/23/2019  . Breast abscess 07/08/2016  . NSVD (normal spontaneous vaginal delivery) 01/29/2016  . Encounter for trial of labor 01/28/2016  . Pelvic pain 01/21/2014  . Galactorrhea 04/08/2013  . Dysmenorrhea 04/08/2013  . Chronic cholecystitis 03/18/2013  . Unspecified constipation 01/27/2013  . Anemia 01/23/2013  . Pyelonephritis 01/22/2013    Current Outpatient Medications on File Prior to Visit  Medication Sig Dispense Refill  . acetaminophen (TYLENOL) 325 MG tablet Take 2 tablets (650 mg total) by mouth every 4 (four) hours as needed (for pain scale < 4). 30 tablet 0  . ciprofloxacin (CIPRO) 500 MG tablet Take 500 mg by mouth 2 (two) times daily.    . diclofenac (VOLTAREN) 75 MG EC tablet Take 75 mg by mouth 2 (two) times daily as needed.    [name] ibuprofen (ADVIL) 600 MG tablet Take 1 tablet (600 mg total) by mouth every 6 (six) hours. 30 tablet 0   No current facility-administered medications on file prior to visit.     No Known Allergies  Objective: Physical Exam  General: Well developed, nourished, no acute distress, awake, alert and oriented x 3  Vascular: Dorsalis pedis artery 2/4 bilateral, Posterior tibial  artery 2/4 bilateral, skin temperature warm to warm proximal to distal bilateral lower extremities, no varicosities, pedal hair present bilateral.  Neurological: Gross sensation present via light touch bilateral.   Dermatological: Skin is warm, dry, and supple bilateral, Nails 1-10 are tender, short thick, and discolored with mild subungal debris with right great toe most involved, no webspace macerations present bilateral, no open lesions present bilateral, no callus/corns/hyperkeratotic tissue present bilateral. No signs of infection bilateral.  Musculoskeletal: Asymptomatic pes planus boney deformities noted bilateral.  Decreased tenderness palpation at plantar fascial insertion on right foot.  Muscular strength within normal limits without pain on range of motion. No pain with calf compression bilateral.  Fungal culture + T Rubrum  Assessment and Plan:  Problem List Items Addressed This Visit    None    Visit Diagnoses    Nail fungus    -  Primary   Relevant Medications   ciclopirox (PENLAC) 8 % solution   Right foot pain       Plantar fasciitis, right          -Examined patient -Discussed treatment options for resolving plantar fasciitis -Applied plantar fascial taping on right advised patient if works well may benefit from over-the-counter super feet orthotics from [employer] or [employer] -Discussed treatment options for painful mycotic nails  -Patient opt for topical -Rx Penlac -Advised good hygiene habits -Patient to return in 6 weeks for follow up evaluation or sooner if symptoms worsen.  Landis Martins, DPM

## 2019-12-17 ENCOUNTER — Other Ambulatory Visit: Payer: Self-pay

## 2019-12-17 ENCOUNTER — Ambulatory Visit: Payer: Medicaid Other | Admitting: Sports Medicine

## 2019-12-17 ENCOUNTER — Encounter: Payer: Self-pay | Admitting: Sports Medicine

## 2019-12-17 DIAGNOSIS — M722 Plantar fascial fibromatosis: Secondary | ICD-10-CM

## 2019-12-17 DIAGNOSIS — M79671 Pain in right foot: Secondary | ICD-10-CM

## 2019-12-17 DIAGNOSIS — B351 Tinea unguium: Secondary | ICD-10-CM

## 2019-12-17 MED ORDER — PREDNISONE 10 MG (21) PO TBPK: ORAL_TABLET | ORAL | 0 refills | Status: DC

## 2019-12-17 NOTE — Progress Notes (Signed)
Subjective: Julia [name] is a 34 y.o. female patient seen today in office for follow up on toe fungus and heel pain on right. Reports that her 1st toenail is doing ok but felt like she had an ingrown and dug it out this AM and still sore. Admits that she is still having pain in right heel with 1st few steps in AM and some calf cramps. Patient reports that taping last time got wet the 1st day and could not wear it anymore.  Patient has no other pedal complaints at this time.   Patient Active Problem List   Diagnosis Date Noted  . SVD (spontaneous vaginal delivery) 05/08/2019  . Postpartum care following vaginal delivery 05/08/2019  . Cholestasis during pregnancy in third trimester 05/07/2019  . Abnormal glucose tolerance test (GTT) during pregnancy, antepartum 04/23/2019  . Breast abscess 07/08/2016  . NSVD (normal spontaneous vaginal delivery) 01/29/2016  . Encounter for trial of labor 01/28/2016  . Pelvic pain 01/21/2014  . Galactorrhea 04/08/2013  . Dysmenorrhea 04/08/2013  . Chronic cholecystitis 03/18/2013  . Unspecified constipation 01/27/2013  . Anemia 01/23/2013  . Pyelonephritis 01/22/2013    Current Outpatient Medications on File Prior to Visit  Medication Sig Dispense Refill  . acetaminophen (TYLENOL) 325 MG tablet Take 2 tablets (650 mg total) by mouth every 4 (four) hours as needed (for pain scale < 4). 30 tablet 0  . ciclopirox (PENLAC) 8 % solution Apply topically at bedtime. Apply over nail and surrounding skin. Apply daily over previous coat. Use nail file to remove build up 6.6 mL 0  . ciprofloxacin (CIPRO) 500 MG tablet Take 500 mg by mouth 2 (two) times daily.    . diclofenac (VOLTAREN) 75 MG EC tablet Take 75 mg by mouth 2 (two) times daily as needed.    [name] ibuprofen (ADVIL) 600 MG tablet Take 1 tablet (600 mg total) by mouth every 6 (six) hours. 30 tablet 0   No current facility-administered medications on file prior to visit.    No Known  Allergies  Objective: Physical Exam  General: Well developed, nourished, no acute distress, awake, alert and oriented x 3  Vascular: Dorsalis pedis artery 2/4 bilateral, Posterior tibial artery 2/4 bilateral, skin temperature warm to warm proximal to distal bilateral lower extremities, no varicosities, pedal hair present bilateral.  Neurological: Gross sensation present via light touch bilateral.   Dermatological: Skin is warm, dry, and supple bilateral, Nails 1-10 are tender, short thick, and discolored with mild subungal debris with right great toe most involved, no acute ingrowing, no webspace macerations present bilateral, no open lesions present bilateral, no callus/corns/hyperkeratotic tissue present bilateral. No signs of infection bilateral.  Musculoskeletal: Asymptomatic pes planus boney deformities noted bilateral. There is tenderness palpation at plantar fascial insertion on right foot.  Muscular strength within normal limits without pain on range of motion. No pain with calf compression bilateral.  Assessment and Plan:  Problem List Items Addressed This Visit    None    Visit Diagnoses    Nail fungus    -  Primary   Plantar fasciitis, right       Right foot pain         -Examined patient -Discussed treatment options for resolving plantar fasciitis with still AM flares -Re-Applied plantar fascial taping on right advised patient if works well may benefit from over-the-counter super feet orthotics from [employer] or [employer] -Rx Prednisone and advised patient to stop breast feeding while taking steroid  -Re-Discussed treatment  options for painful mycotic nails  -Continue with Penlac for fungal nails -Advised patient that there is no sign of acute ingrown at right 1st toe -Advised good hygiene habits -Patient to return in 4-6 weeks for follow up evaluation or sooner if symptoms worsen.  Asencion Islam, DPM

## 2020-01-14 ENCOUNTER — Other Ambulatory Visit: Payer: Self-pay

## 2020-01-14 ENCOUNTER — Encounter: Payer: Self-pay | Admitting: Sports Medicine

## 2020-01-14 ENCOUNTER — Ambulatory Visit: Payer: Medicaid Other | Admitting: Sports Medicine

## 2020-01-14 VITALS — Temp 98.0°F

## 2020-01-14 DIAGNOSIS — B351 Tinea unguium: Secondary | ICD-10-CM

## 2020-01-14 DIAGNOSIS — M79671 Pain in right foot: Secondary | ICD-10-CM

## 2020-01-14 DIAGNOSIS — L600 Ingrowing nail: Secondary | ICD-10-CM

## 2020-01-14 DIAGNOSIS — M722 Plantar fascial fibromatosis: Secondary | ICD-10-CM

## 2020-01-14 DIAGNOSIS — M79674 Pain in right toe(s): Secondary | ICD-10-CM

## 2020-01-14 NOTE — Progress Notes (Signed)
Subjective: Julia [name] is a 34 y.o. female patient seen today in office for follow up on toe fungus and heel pain on right. Reports that her 1st toenail is still growing into the skin and getting sore on the medial> lateral corner but denies any drainage, redness, or puss to area. Patient reports that her heel on right feels much better does not bother her pain 1/10.  Patient has no other pedal complaints at this time.   Patient Active Problem List   Diagnosis Date Noted  . SVD (spontaneous vaginal delivery) 05/08/2019  . Postpartum care following vaginal delivery 05/08/2019  . Cholestasis during pregnancy in third trimester 05/07/2019  . Abnormal glucose tolerance test (GTT) during pregnancy, antepartum 04/23/2019  . Breast abscess 07/08/2016  . NSVD (normal spontaneous vaginal delivery) 01/29/2016  . Encounter for trial of labor 01/28/2016  . Pelvic pain 01/21/2014  . Galactorrhea 04/08/2013  . Dysmenorrhea 04/08/2013  . Chronic cholecystitis 03/18/2013  . Unspecified constipation 01/27/2013  . Anemia 01/23/2013  . Pyelonephritis 01/22/2013    Current Outpatient Medications on File Prior to Visit  Medication Sig Dispense Refill  . acetaminophen (TYLENOL) 325 MG tablet Take 2 tablets (650 mg total) by mouth every 4 (four) hours as needed (for pain scale < 4). 30 tablet 0  . ciclopirox (PENLAC) 8 % solution Apply topically at bedtime. Apply over nail and surrounding skin. Apply daily over previous coat. Use nail file to remove build up 6.6 mL 0  . diclofenac (VOLTAREN) 75 MG EC tablet Take 75 mg by mouth 2 (two) times daily as needed.    [name] ibuprofen (ADVIL) 600 MG tablet Take 1 tablet (600 mg total) by mouth every 6 (six) hours. 30 tablet 0  . predniSONE (STERAPRED UNI-PAK 21 TAB) 10 MG (21) TBPK tablet Take as directed 21 tablet 0  . ciprofloxacin (CIPRO) 500 MG tablet Take 500 mg by mouth 2 (two) times daily.     No current facility-administered medications on file  prior to visit.    No Known Allergies  Objective: Physical Exam  General: Well developed, nourished, no acute distress, awake, alert and oriented x 3  Vascular: Dorsalis pedis artery 2/4 bilateral, Posterior tibial artery 2/4 bilateral, skin temperature warm to warm proximal to distal bilateral lower extremities, no varicosities, pedal hair present bilateral.  Neurological: Gross sensation present via light touch bilateral.   Dermatological: Skin is warm, dry, and supple bilateral, Nails 1-10 are tender, short thick, and discolored with mild subungal debris with right great toe most involved, no acute ingrowing but subjectively has pain on medial>lateral margins, no webspace macerations present bilateral, no open lesions present bilateral, no callus/corns/hyperkeratotic tissue present bilateral. No signs of infection bilateral.  Musculoskeletal: Asymptomatic pes planus boney deformities noted bilateral. There is no tenderness palpation at plantar fascial insertion on right foot.  Muscular strength within normal limits without pain on range of motion. No pain with calf compression bilateral.  Assessment and Plan:  Problem List Items Addressed This Visit    None    Visit Diagnoses    Plantar fasciitis, right    -  Primary   Right foot pain       Nail fungus       Ingrown nail       Toe pain, right         -Examined patient -Discussed treatment options for pain at right 1st toenail -Patient wants to think about the procedure options for her 1st toe  -Continue with  Penlac for fungal nails meanwhile -Advised patient to continue with daily stretching to prevent recurrence of fasciitis and continue with good supportive shoes  -Patient to return PRN or sooner if symptoms worsen.  Landis Martins, DPM

## 2020-07-02 ENCOUNTER — Other Ambulatory Visit: Payer: Self-pay

## 2020-07-02 ENCOUNTER — Ambulatory Visit: Payer: Medicaid Other | Admitting: Podiatry

## 2020-07-02 ENCOUNTER — Ambulatory Visit (INDEPENDENT_AMBULATORY_CARE_PROVIDER_SITE_OTHER): Payer: Medicaid Other | Admitting: Podiatry

## 2020-07-02 ENCOUNTER — Encounter: Payer: Self-pay | Admitting: Podiatry

## 2020-07-02 VITALS — Temp 98.0°F

## 2020-07-02 DIAGNOSIS — L600 Ingrowing nail: Secondary | ICD-10-CM | POA: Diagnosis not present

## 2020-07-02 DIAGNOSIS — B351 Tinea unguium: Secondary | ICD-10-CM

## 2020-07-02 MED ORDER — NEOMYCIN-POLYMYXIN-HC 3.5-10000-1 OT SOLN: OTIC | 0 refills | Status: AC

## 2020-07-02 MED ORDER — TERBINAFINE HCL 250 MG PO TABS: 250.0000 mg | ORAL_TABLET | Freq: Every day | ORAL | 0 refills | Status: AC

## 2020-07-02 NOTE — Patient Instructions (Signed)

## 2020-07-02 NOTE — Progress Notes (Signed)
°  Subjective:  Patient ID: Julia [name], female    DOB: 02/06/1986,  MRN: [id number]  Chief Complaint  Patient presents with   Nail Problem    Right foot; Hallux-both sides; pt stated, "I dug out a piece of nail yesterday; I want my nail checked for ingrown toenail; has a history of ingrown toenails"; x2-3 weeks    34 y.o. female presents with the above complaint. History confirmed with patient.  She also notes there is still ingrown and if we can remove it if it is there.  This happens almost once a month for her.  She present saw Dr. Marylene Land for onychomycosis, she took a sample which showed nail fungus, and was going to prescribe terbinafine but she was breast-feeding at the time and did not want to take it.  She would be interested in taking it now.  Objective:  Physical Exam: warm, good capillary refill, no trophic changes or ulcerative lesions, normal DP and PT pulses and normal sensory exam. Left Foot: normal exam, no swelling, tenderness, instability; ligaments intact, full range of motion of all ankle/foot joints  Right Foot: Right hallux medial border ingrown toenail without paronychia.  Onychomycosis of the hallux nail    Assessment:   1. Ingrowing right great toenail   2. Onychomycosis   3. Pain due to onychomycosis of toenails of both feet      Plan:  Patient was evaluated and treated and all questions answered.    Ingrown Nail, right -Patient elects to proceed with minor surgery to remove ingrown toenail today. Consent reviewed and signed by patient. -Ingrown nail excised. See procedure note. -Educated on post-procedure care including soaking. Written instructions provided and reviewed. -Patient to follow up in 2 weeks for nail check.  Procedure: Excision of Ingrown Toenail Location: Right 1st toe medial nail borders. Anesthesia: Lidocaine 1% plain; 1.5 mL and Marcaine 0.5% plain; 1.5 mL, digital block. Skin Prep: Betadine. Dressing: Silvadene; telfa;  dry, sterile, compression dressing. Technique: Following skin prep, the toe was exsanguinated and a tourniquet was secured at the base of the toe. The affected nail border was freed, split with a nail splitter, and excised.  Originally we discussed permanent chemical matricectomy and had plan to do this and she signed up for this, however prior to the procedure she asked me it be possible if we did not do that and just remove the ingrown toenail without the matricectomy and she would like to see if it will grow out normally while she is on the terbinafine.  No matricectomy was performed today.  The tourniquet was then removed and sterile dressing applied. Disposition: Patient tolerated procedure well. Patient to return in 2 weeks for follow-up.     Onychomycosis -Rx for terbinafine 90 day course sent to pharmacy.  -No hx of liver disease or alcohol use     Return in about 2 weeks (around 07/16/2020) for nail re-check.

## 2020-07-23 ENCOUNTER — Ambulatory Visit: Payer: Medicaid Other | Admitting: Podiatry

## 2021-04-07 ENCOUNTER — Other Ambulatory Visit: Payer: Self-pay

## 2021-04-07 ENCOUNTER — Ambulatory Visit (INDEPENDENT_AMBULATORY_CARE_PROVIDER_SITE_OTHER): Payer: Medicaid Other | Admitting: Podiatry

## 2021-04-07 DIAGNOSIS — L600 Ingrowing nail: Secondary | ICD-10-CM

## 2021-04-07 NOTE — Progress Notes (Signed)
   Subjective: Patient presents today for evaluation of pain to the lateral border left great toe. Patient is concerned for possible ingrown nail. Patient presents today for further treatment and evaluation.  Past Medical History:  Diagnosis Date  . Anxiety   . Arthritis   . Back pain   . Chlamydia 2007  . Cholestasis during pregnancy   . Complication of anesthesia   . Depression   . Dyspepsia   . GERD (gastroesophageal reflux disease)   . Gestational diabetes   . Infection   . Kidney stones   . Migraine   . PONV (postoperative nausea and vomiting)   . Right sided weakness   . Sinusitis   . SVD (spontaneous vaginal delivery) 05/08/2019  . Vaginal Pap smear, abnormal     Objective:  General: Well developed, nourished, in no acute distress, alert and oriented x3   Dermatology: Skin is warm, dry and supple bilateral.  Lateral border left great toe appears to be erythematous with evidence of an ingrowing nail. Pain on palpation noted to the border of the nail fold. The remaining nails appear unremarkable at this time. There are no open sores, lesions.  Vascular: Dorsalis Pedis artery and Posterior Tibial artery pedal pulses palpable. No lower extremity edema noted.   Neruologic: Grossly intact via light touch bilateral.  Musculoskeletal: Muscular strength within normal limits in all groups bilateral. Normal range of motion noted to all pedal and ankle joints.   Assesement: #1 Paronychia with ingrowing nail lateral border left great toe #2 Pain in toe #3 Incurvated nail  Plan of Care:  1. Patient evaluated.  2. Discussed treatment alternatives and plan of care. Explained nail avulsion procedure and post procedure course to patient. 3. Patient opted for temporary partial nail avulsion of the lateral border left great toe.  4. Prior to procedure, local anesthesia infiltration utilized using 3 ml of a 50:50 mixture of 2% plain lidocaine and 0.5% plain marcaine in a normal hallux  block fashion and a betadine prep performed.  5. Partial temporary nail avulsion without chemical matrixectomy performed followed by alcohol flush.  6. Light dressing applied. 7.  Silvadene cream provided  8.  Return to clinic as needed.  Felecia Shelling, DPM [facility]  Dr. Felecia Shelling, DPM    [address] [place], [place] [address]                Office [phone]  Fax [phone]

## 2021-04-15 MED ORDER — ITRACONAZOLE 100 MG PO CAPS: 100.0000 mg | ORAL_CAPSULE | Freq: Every day | ORAL | 0 refills | Status: AC

## 2021-04-15 NOTE — Addendum Note (Signed)
Addended by: Felecia Shelling on: 04/15/2021 05:09 PM   Modules accepted: Orders
